# Patient Record
Sex: Female | Born: 1980 | Race: White | Hispanic: No | Smoking: Never smoker
Health system: Southern US, Community
[De-identification: ages and names within clinical notes are randomized; demographics above are authoritative.]

## PROBLEM LIST (undated history)

## (undated) DIAGNOSIS — F419 Anxiety disorder, unspecified: Secondary | ICD-10-CM

## (undated) DIAGNOSIS — M51369 Other intervertebral disc degeneration, lumbar region without mention of lumbar back pain or lower extremity pain: Secondary | ICD-10-CM

## (undated) DIAGNOSIS — Z808 Family history of malignant neoplasm of other organs or systems: Secondary | ICD-10-CM

## (undated) DIAGNOSIS — T451X5A Adverse effect of antineoplastic and immunosuppressive drugs, initial encounter: Secondary | ICD-10-CM

## (undated) DIAGNOSIS — D649 Anemia, unspecified: Secondary | ICD-10-CM

## (undated) DIAGNOSIS — M5136 Other intervertebral disc degeneration, lumbar region: Secondary | ICD-10-CM

## (undated) DIAGNOSIS — R011 Cardiac murmur, unspecified: Secondary | ICD-10-CM

## (undated) DIAGNOSIS — G620 Drug-induced polyneuropathy: Secondary | ICD-10-CM

## (undated) DIAGNOSIS — I10 Essential (primary) hypertension: Secondary | ICD-10-CM

## (undated) DIAGNOSIS — Z800 Family history of malignant neoplasm of digestive organs: Secondary | ICD-10-CM

## (undated) DIAGNOSIS — Z807 Family history of other malignant neoplasms of lymphoid, hematopoietic and related tissues: Secondary | ICD-10-CM

## (undated) DIAGNOSIS — F32A Depression, unspecified: Secondary | ICD-10-CM

## (undated) HISTORY — DX: Family history of malignant neoplasm of digestive organs: Z80.0

## (undated) HISTORY — DX: Family history of malignant neoplasm of other organs or systems: Z80.8

## (undated) HISTORY — PX: TUBAL LIGATION: SHX77

## (undated) HISTORY — DX: Family history of other malignant neoplasms of lymphoid, hematopoietic and related tissues: Z80.7

## (undated) HISTORY — PX: TONSILLECTOMY: SUR1361

---

## 2020-01-25 DIAGNOSIS — Z9221 Personal history of antineoplastic chemotherapy: Secondary | ICD-10-CM

## 2020-01-25 DIAGNOSIS — Z923 Personal history of irradiation: Secondary | ICD-10-CM

## 2020-01-25 HISTORY — DX: Personal history of irradiation: Z92.3

## 2020-01-25 HISTORY — DX: Personal history of antineoplastic chemotherapy: Z92.21

## 2020-03-24 DIAGNOSIS — C50919 Malignant neoplasm of unspecified site of unspecified female breast: Secondary | ICD-10-CM

## 2020-03-24 HISTORY — DX: Malignant neoplasm of unspecified site of unspecified female breast: C50.919

## 2020-04-14 ENCOUNTER — Other Ambulatory Visit: Payer: Self-pay | Admitting: Surgery

## 2020-04-14 DIAGNOSIS — C50911 Malignant neoplasm of unspecified site of right female breast: Secondary | ICD-10-CM

## 2020-04-15 ENCOUNTER — Encounter: Payer: Self-pay | Admitting: Radiation Oncology

## 2020-04-15 ENCOUNTER — Other Ambulatory Visit (HOSPITAL_COMMUNITY): Payer: Self-pay | Admitting: Surgery

## 2020-04-15 DIAGNOSIS — C50911 Malignant neoplasm of unspecified site of right female breast: Secondary | ICD-10-CM

## 2020-04-16 ENCOUNTER — Other Ambulatory Visit: Payer: Self-pay | Admitting: *Deleted

## 2020-04-16 DIAGNOSIS — C50411 Malignant neoplasm of upper-outer quadrant of right female breast: Secondary | ICD-10-CM | POA: Insufficient documentation

## 2020-04-19 ENCOUNTER — Ambulatory Visit (HOSPITAL_COMMUNITY)
Admission: RE | Admit: 2020-04-19 | Discharge: 2020-04-19 | Disposition: A | Discharge status: Home / Self Care | Payer: PRIVATE HEALTH INSURANCE | Source: Ambulatory Visit | Attending: Surgery | Admitting: Surgery

## 2020-04-19 ENCOUNTER — Encounter (HOSPITAL_COMMUNITY): Payer: Self-pay

## 2020-04-19 ENCOUNTER — Other Ambulatory Visit: Payer: Self-pay

## 2020-04-19 DIAGNOSIS — C50911 Malignant neoplasm of unspecified site of right female breast: Secondary | ICD-10-CM

## 2020-04-20 ENCOUNTER — Other Ambulatory Visit: Payer: Self-pay | Admitting: Radiation Oncology

## 2020-04-20 ENCOUNTER — Ambulatory Visit (HOSPITAL_COMMUNITY): Admission: RE | Admit: 2020-04-20 | Payer: PRIVATE HEALTH INSURANCE | Source: Ambulatory Visit

## 2020-04-20 ENCOUNTER — Encounter (HOSPITAL_COMMUNITY): Payer: Self-pay

## 2020-04-20 ENCOUNTER — Ambulatory Visit
Admission: RE | Admit: 2020-04-20 | Discharge: 2020-04-20 | Disposition: A | Discharge status: Home / Self Care | Payer: Self-pay | Source: Ambulatory Visit | Attending: Radiation Oncology | Admitting: Radiation Oncology

## 2020-04-20 ENCOUNTER — Inpatient Hospital Stay
Admission: RE | Admit: 2020-04-20 | Discharge: 2020-04-20 | Disposition: A | Discharge status: Home / Self Care | Payer: Self-pay | Source: Ambulatory Visit | Attending: Radiation Oncology | Admitting: Radiation Oncology

## 2020-04-20 ENCOUNTER — Ambulatory Visit (HOSPITAL_COMMUNITY)
Admission: RE | Admit: 2020-04-20 | Discharge: 2020-04-20 | Disposition: A | Discharge status: Home / Self Care | Payer: PRIVATE HEALTH INSURANCE | Source: Ambulatory Visit | Attending: Surgery | Admitting: Surgery

## 2020-04-20 DIAGNOSIS — C50911 Malignant neoplasm of unspecified site of right female breast: Secondary | ICD-10-CM | POA: Insufficient documentation

## 2020-04-20 DIAGNOSIS — C50919 Malignant neoplasm of unspecified site of unspecified female breast: Secondary | ICD-10-CM

## 2020-04-20 MED ORDER — GADOBUTROL 1 MMOL/ML IV SOLN
6.5000 mL | Freq: Once | INTRAVENOUS | Status: AC | PRN
  Administered 2020-04-20: 6.5 mL via INTRAVENOUS

## 2020-04-20 NOTE — Progress Notes (Signed)
I have placed an introductory phone call to this patient today. I introduced myself and explained my role in the patient's care. I provided my contact information and encouraged the patient to call with questions or concerns.

## 2020-04-21 ENCOUNTER — Other Ambulatory Visit: Payer: Self-pay | Admitting: Surgery

## 2020-04-21 ENCOUNTER — Other Ambulatory Visit: Payer: Self-pay

## 2020-04-21 ENCOUNTER — Inpatient Hospital Stay (HOSPITAL_COMMUNITY): Payer: PRIVATE HEALTH INSURANCE | Attending: Hematology | Admitting: Hematology

## 2020-04-21 ENCOUNTER — Encounter (HOSPITAL_COMMUNITY): Payer: Self-pay | Admitting: Hematology

## 2020-04-21 VITALS — BP 126/79 | HR 80 | Temp 98.9°F | Resp 18 | Ht 60.0 in | Wt 131.0 lb

## 2020-04-21 DIAGNOSIS — C50911 Malignant neoplasm of unspecified site of right female breast: Secondary | ICD-10-CM

## 2020-04-21 DIAGNOSIS — C50411 Malignant neoplasm of upper-outer quadrant of right female breast: Secondary | ICD-10-CM | POA: Diagnosis present

## 2020-04-21 DIAGNOSIS — Z171 Estrogen receptor negative status [ER-]: Secondary | ICD-10-CM

## 2020-04-21 DIAGNOSIS — Z170 Estrogen receptor positive status [ER+]: Secondary | ICD-10-CM | POA: Diagnosis not present

## 2020-04-21 DIAGNOSIS — Z85110 Personal history of malignant carcinoid tumor of bronchus and lung: Secondary | ICD-10-CM | POA: Insufficient documentation

## 2020-04-21 NOTE — Progress Notes (Addendum)
[facility] [address], [place] [address]   Patient Care Team: Default, Provider, MD as PCP - General Brien Mates, RN as Oncology Nurse Navigator (Oncology)  CHIEF COMPLAINTS/PURPOSE OF CONSULTATION:  Newly diagnosed right breast cancer  HISTORY OF PRESENTING ILLNESS:  Lauren Ortega 40 y.o. female is here because of recent diagnosis of right breast cancer, at the request of Dr. Coralie Keens from [facility]. She is scheduled to have a right breast lumpectomy on 04/27 by Dr. Ninfa Linden.  Today she is accompanied by her friend, Lauren Ortega, and she reports feeling okay. She reports that she felt the mass in February and had the mammogram and biopsy in [place]. She has never had any breast biopsies previously. She reports having heavy periods lasting 5 days every 28 days. She has degenerative disc disease and will have occasional tingling in her left leg down to her toes, but none in her hands. She gets leg swelling occasionally since her amlodipine was increased to 10 mg.  Her MGM deceased from colon cancer; her MGF deceased from non-Hodgkin lymphoma; PGF deceased from lung cancer; her mother had basal cell skin carcinoma; maternal uncle has multiple myeloma and treated for the past 10 years. She works as a Production designer, theatre/television/film for a [employer]. She is a non-smoker.  She is scheduled to meet Dr. Lisbeth Renshaw on 03/30.  I reviewed her records extensively and collaborated the history with the patient.  SUMMARY OF ONCOLOGIC HISTORY: Oncology History   No history exists.    In terms of breast cancer risk profile:  She menarched at early age of 32. LMP was on 03/25.  She had 4 pregnancies, her first child was born at age 40. First pregnancy was terminated at 12 weeks at age 54. She received birth control pills for approximately 6 years.  She was never exposed to fertility medications or hormone replacement therapy.  She has negative family history of  Breast/GYN/GI cancer.  MEDICAL HISTORY:  No past medical history on file.  SURGICAL HISTORY: ** The histories are not reviewed yet. Please review them in the "History" navigator section and refresh this [place].  SOCIAL HISTORY: Social History   Socioeconomic History  . Marital status: Divorced    Spouse name: Not on file  . Number of children: Not on file  . Years of education: Not on file  . Highest education level: Not on file  Occupational History  . Not on file  Tobacco Use  . Smoking status: Never Smoker  . Smokeless tobacco: Never Used  Substance and Sexual Activity  . Alcohol use: Yes    Comment: occassionally  . Drug use: Not on file  . Sexual activity: Yes  Other Topics Concern  . Not on file  Social History Narrative  . Not on file   Social Determinants of Health   Financial Resource Strain: Low Risk   . Difficulty of Paying Living Expenses: Not hard at all  Food Insecurity: No Food Insecurity  . Worried About Charity fundraiser in the Last Year: Never true  . Ran Out of Food in the Last Year: Never true  Transportation Needs: No Transportation Needs  . Lack of Transportation (Medical): No  . Lack of Transportation (Non-Medical): No  Physical Activity: Insufficiently Active  . Days of Exercise per Week: 2 days  . Minutes of Exercise per Session: 20 min  Stress: No Stress Concern Present  . Feeling of Stress : Not at all  Social Connections: Moderately Integrated  . Frequency of Communication with Friends and Family: More than three times a week  . Frequency of Social Gatherings with Friends and Family: More than three times a week  . Attends Religious Services: 1 to 4 times per year  . Active Member of Clubs or Organizations: No  . Attends Archivist Meetings: 1 to 4 times per year  . Marital Status: Divorced  Human resources officer Violence: Not At Risk  . Fear of Current or Ex-Partner: No  . Emotionally Abused: No  . Physically Abused: No   . Sexually Abused: No    FAMILY HISTORY: No family history on file.  ALLERGIES:  has no allergies on file.  MEDICATIONS:  Current Outpatient Medications  Medication Sig Dispense Refill  . amLODipine (NORVASC) 10 MG tablet Take 10 mg by mouth daily.    Marland Kitchen FERREX 150 150 MG capsule Take 150 mg by mouth daily.    . sertraline (ZOLOFT) 50 MG tablet Take 50 mg by mouth daily.    Marland Kitchen spironolactone (ALDACTONE) 50 MG tablet Take 50 mg by mouth every morning.    . baclofen (LIORESAL) 20 MG tablet Take 20 mg by mouth 3 (three) times daily as needed. (Patient not taking: Reported on 04/21/2020)    . meloxicam (MOBIC) 15 MG tablet Take 15 mg by mouth daily as needed. (Patient not taking: Reported on 04/21/2020)     No current facility-administered medications for this visit.    REVIEW OF SYSTEMS:   Review of Systems  Constitutional: Positive for appetite change (75%) and fatigue (50%).  Cardiovascular: Positive for leg swelling (occasional d/t amlodipine).  Genitourinary: Negative for menstrual problem.   Neurological: Positive for numbness (occasional in L toes).  All other systems reviewed and are negative.   PHYSICAL EXAMINATION: ECOG PERFORMANCE STATUS: 0 - Asymptomatic  Vitals:   04/21/20 1414  BP: 126/79  Pulse: 80  Resp: 18  Temp: 98.9 F (37.2 C)  SpO2: 99%   Filed Weights   04/21/20 1414  Weight: 131 lb (59.4 kg)   Physical Exam Vitals reviewed.  Constitutional:      Appearance: Normal appearance.  Cardiovascular:     Rate and Rhythm: Normal rate and regular rhythm.     Pulses: Normal pulses.     Heart sounds: Normal heart sounds.  Pulmonary:     Effort: Pulmonary effort is normal.     Breath sounds: Normal breath sounds.  Chest:  Breasts:     Right: Mass (2 cm freely mobile @ 10 o'clock) present. No swelling, bleeding, inverted nipple, nipple discharge, skin change, tenderness, axillary adenopathy or supraclavicular adenopathy.     Left: No swelling, bleeding,  inverted nipple, mass, nipple discharge, skin change, tenderness, axillary adenopathy or supraclavicular adenopathy.    Abdominal:     Palpations: Abdomen is soft. There is no hepatomegaly, splenomegaly or mass.     Tenderness: There is no abdominal tenderness.  Musculoskeletal:     Right lower leg: No edema.     Left lower leg: No edema.  Lymphadenopathy:     Cervical: No cervical adenopathy.     Upper Body:     Right upper body: No supraclavicular, axillary or pectoral adenopathy.     Left upper body: No supraclavicular, axillary or pectoral adenopathy.  Neurological:     General: No focal deficit present.     Mental Status: She is alert and oriented to person, place, and time.  Psychiatric:  Mood and Affect: Mood normal.        Behavior: Behavior normal.      LABORATORY DATA:  I have reviewed the data as listed No results found for this or any previous visit (from the past 2160 hour(s)).  RADIOGRAPHIC STUDIES: I have personally reviewed the radiological reports and agreed with the findings in the report. No results found.   ASSESSMENT:  1.  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  She had mammogram/ultrasound on 03/18/2020 done in [place] which showed a 1.4 x 0.9 x 1.5 cm mass in the upper outer quadrant of the right breast. -Biopsy on 03/31/2020 consistent with intermediate to high-grade infiltrating ductal carcinoma of the right breast at 10:30 position, HER-2 3+ positive, Ki-67 40%, ER weak staining in less than 10% of tumor cells, PR negative. -She was evaluated by Dr. Ninfa Linden and MRI was done on 04/20/2020.  2.  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  Never smoker. -Maternal grandmother had colon cancer.  Maternal grandfather died of non-Hodgkin's lymphoma.  Paternal grandfather died of lung cancer.   Maternal uncle had multiple myeloma.  Mother had basal cell skin cancer.    PLAN:  1.  Stage I (T1c N0 M0) right breast IDC, ER weakly positive, PR negative, HER-2 3+ positive: -We talked about pathology report and scans in detail. -I have also talked to her about neoadjuvant chemotherapy with [facility] regimen pending the MRI results. -If the MRI does not show any lymph node involvement, she may proceed with surgical resection followed by 6 cycles of TCH in 1 year of Herceptin in total.  We also talked about side effects of TCH regimen in general.  She is also meeting up with Dr. Lisbeth Renshaw tomorrow. -She will require a port placed.  We have also talked to her about URCC nausea protocol. -Given her young age of diagnosis, strongly recommend genetic testing.  Addendum: -I have reviewed MRI reports with the patient from 04/20/2020 which showed 1.5 cm upper outer right breast malignancy which was biopsy-proven.  No evidence of multifocal or multicentric or contralateral malignancy.  No abnormal lymph nodes. -I have recommended proceed with lumpectomy and SLNB. -We will see her back in 3 to 4 weeks after surgery.     All questions were answered. The patient knows to call the clinic with any problems, questions or concerns.  Derek Jack, MD 04/21/20 3:13 PM  [facility] [phone]   I, Milinda Antis, am acting as a scribe for Dr. Sanda Linger.  I, Derek Jack MD, have reviewed the above documentation for accuracy and completeness, and I agree with the above.

## 2020-04-21 NOTE — Progress Notes (Signed)
New Breast Cancer Diagnosis: Right Breast Cancer UOQ  Did patient present with symptoms (if so, please note symptoms) or screening mammography?:Palpable mass    Location and Extent of disease :right breast. Located at upper outer quadrant, measured  1.5 cm in greatest dimension. Adenopathy no.  Histology per Pathology Report: grade 3, Infiltrating Ductal Carcinoma 03/31/2020  Receptor Status: ER(weakly positive), PR (negative), Her2-neu (positive), Ki-(40%)  Surgeon and surgical plan, if any: Dr. Beryle Beams 04/14/2020 -She would like to pursue breast conservation. -Given her Her2 positive status, she will also require a Port-a-cath insertion. -She needs a preoperative MRI given her age to determine the true size of the malignancy. -She will also need both medical and radiation oncology as well as genetics. -If she is genetically positive, we will consider bilateral mastectomies. -Medical oncology may determine that we need to proceed with neoadjuvant therapy prior to surgery. -Tentatively scheduled Right breast lumpectomy with radioactive seed and SLN biopsy 05/20/2020  Medical oncologist, treatment if any:   Dr. Delton Coombes 04/21/2020 2 pm -We talked about pathology report and scans in detail. -I have offered her neoadjuvant chemotherapy with [facility] regimen pending the MRI results. -If the MRI does not show any lymph node involvement, she may proceed with surgical resection followed by 6 cycles of TCH in 1 year of Herceptin in total.  We also talked about side effects of TCH regimen in general.  She is also meeting up with Dr. Lisbeth Renshaw tomorrow. -She will require a port placed.  We have also talked to her about URC nausea protocol. -Given her young age of diagnosis, strongly recommend genetic testing. -We will reach out to the patient after the MRI resulted.   Family History of Breast/Ovarian/Prostate Cancer: None  Lymphedema issues, if any:  no  Pain issues, if any:  Has some soreness from he  biopsy.  SAFETY ISSUES: Prior radiation? No Pacemaker/ICD? No Possible current pregnancy? Tubal Ligation Is the patient on methotrexate? No  Current Complaints / other details:   -Genetics 05/04/2020

## 2020-04-21 NOTE — Patient Instructions (Signed)
[place] at [facility] Discharge Instructions  You were seen and examined today by Dr. Delton Coombes. Dr. Delton Coombes is a medical oncologist, meaning he specializes in the management of cancer diagnoses with medications. Dr. Delton Coombes discussed your past medical history, family history of cancer and the events that led to you being here today.  You have been diagnosed with HER2 positive breast cancer. This does mean that your type of cancer is a bit more aggressive. Dr. Delton Coombes has recommended that you proceed as scheduled with the lumpectomy surgery, but typically we arrange for treatment first followed by surgery. Pending MRI results, if there are questionable lymph nodes involved with cancer, you will need an additional biopsy of the lymph nodes. Assuming there is no lymph nodes involved, you have a Stage I cancer. Dr. Delton Coombes has discussed chemotherapy regimen. A more accurate plan will be devised once the MRI is read.  Dr. Delton Coombes has recommended an echocardiogram in anticipation of chemotherapy. Dr. Delton Coombes has also recommended genetic testing, this could help to drive treatment options and be helpful for your children if positive.  Follow-up as scheduled.   Thank you for choosing [place] at [facility] to provide your oncology and hematology care.  To afford each patient quality time with our provider, please arrive at least 15 minutes before your scheduled appointment time.   If you have a lab appointment with the [facility] please come in thru the Main Entrance and check in at the main information desk.  You need to re-schedule your appointment should you arrive 10 or more minutes late.  We strive to give you quality time with our providers, and arriving late affects you and other patients whose appointments are after yours.  Also, if you no show three or more times for appointments you may be dismissed from the clinic at the  providers discretion.     Again, thank you for choosing [facility].  Our hope is that these requests will decrease the amount of time that you wait before being seen by our physicians.       _____________________________________________________________  Should you have questions after your visit to [facility], please contact our office at [phone] and follow the prompts.  Our office hours are 8:00 a.m. and 4:30 p.m. Monday - Friday.  Please note that voicemails left after 4:00 p.m. may not be returned until the following business day.  We are closed weekends and major holidays.  You do have access to a nurse 24-7, just call the main number to the clinic [phone] and do not press any options, hold on the line and a nurse will answer the phone.    For prescription refill requests, have your pharmacy contact our office and allow 72 hours.    Due to Covid, you will need to wear a mask upon entering the hospital. If you do not have a mask, a mask will be given to you at the Main Entrance upon arrival. For doctor visits, patients may have 1 support person age 41 or older with them. For treatment visits, patients can not have anyone with them due to social distancing guidelines and our immunocompromised population.

## 2020-04-22 ENCOUNTER — Ambulatory Visit
Admission: RE | Admit: 2020-04-22 | Discharge: 2020-04-22 | Disposition: A | Discharge status: Home / Self Care | Payer: PRIVATE HEALTH INSURANCE | Source: Ambulatory Visit | Attending: Radiation Oncology | Admitting: Radiation Oncology

## 2020-04-22 ENCOUNTER — Other Ambulatory Visit: Payer: Self-pay

## 2020-04-22 ENCOUNTER — Telehealth: Payer: Self-pay | Admitting: Radiology

## 2020-04-22 ENCOUNTER — Encounter: Payer: Self-pay | Admitting: Radiation Oncology

## 2020-04-22 VITALS — Ht 60.0 in | Wt 131.0 lb

## 2020-04-22 DIAGNOSIS — C50411 Malignant neoplasm of upper-outer quadrant of right female breast: Secondary | ICD-10-CM

## 2020-04-22 DIAGNOSIS — Z170 Estrogen receptor positive status [ER+]: Secondary | ICD-10-CM

## 2020-04-22 NOTE — Progress Notes (Signed)
Radiation Oncology         [phone] ________________________________  Initial Outpatient Consultation - Conducted via telephone due to current COVID-19 concerns for limiting patient exposure  I spoke with the patient to conduct this consult visit via telephone to spare the patient unnecessary potential exposure in the healthcare setting during the current COVID-19 pandemic. The patient was notified in advance and was offered a [place] meeting to allow for face to face communication but unfortunately reported that they did not have the appropriate resources/technology to support such a visit and instead preferred to proceed with a telephone consult.   Name: Lauren Ortega        MRN: [id number]  Date of Service: 04/22/2020 DOB: April 17, 1980  OZ:[id number], Provider, MD  Coralie Keens, MD     REFERRING PHYSICIAN: Coralie Keens, MD   DIAGNOSIS: The encounter diagnosis was Malignant neoplasm of upper-outer quadrant of right female breast, unspecified estrogen receptor status ([place]).   HISTORY OF PRESENT ILLNESS: Lauren Ortega is a 40 y.o. female seen at the request of Dr. Ninfa Linden for new diagnosis of right breast cancer.  The patient was found to have a mass in the upper outer quadrant of the right breast that she noted.  Diagnostic work-up showed this mass to measure 1.5 cm in the upper outer quadrant of the right breast and a biopsy on 03/31/2020 showed an infiltrating ductal carcinoma, grade 3 her ER was positive with weak staining in less than 10% of tumor cells PR negative, and HER-2 amplified, her Ki-67 was 40%.  She has met with Dr. Ninfa Linden who recommended lumpectomy with sentinel lymph node biopsy and Port-A-Cath placement.  She did undergo an MRI of the breast on 04/20/2020 but the results have not been read.  She also met with Dr. Delton Coombes yesterday and plans to receive genetic testing results and systemic therapy with the timing of this depending on her MRI results.  She is contacted today by  telephone to discuss adjuvant radiotherapy.     PREVIOUS RADIATION THERAPY: No   PAST MEDICAL HISTORY:  Past Medical History:  Diagnosis Date  . Breast cancer ([place]) 03/2020       PAST SURGICAL HISTORY: Past Surgical History:  Procedure Laterality Date  . TUBAL LIGATION       FAMILY HISTORY:  Family History  Problem Relation Age of Onset  . Basal cell carcinoma Mother   . Multiple myeloma Maternal Uncle   . Colon cancer Maternal Grandmother   . Non-Hodgkin's lymphoma Maternal Grandfather   . Lung cancer Paternal Grandfather      SOCIAL HISTORY:  reports that she has never smoked. She has never used smokeless tobacco. She reports current alcohol use. She reports that she does not use drugs. The patient is divorced and lives in [place], [place]. 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 She has for children ages 84, 5, and 3 year old twins.    ALLERGIES: Patient has no allergy information on record.   MEDICATIONS:  Current Outpatient Medications  Medication Sig Dispense Refill  . amLODipine (NORVASC) 10 MG tablet Take 10 mg by mouth daily.    Marland Kitchen FERREX 150 150 MG capsule Take 150 mg by mouth daily.    . sertraline (ZOLOFT) 50 MG tablet Take 50 mg by mouth daily.    Marland Kitchen spironolactone (ALDACTONE) 50 MG tablet Take 50 mg by mouth every morning.    . baclofen (LIORESAL) 20 MG tablet Take 20 mg by mouth 3 (  three) times daily as needed. (Patient not taking: No sig reported)    . meloxicam (MOBIC) 15 MG tablet Take 15 mg by mouth daily as needed. (Patient not taking: No sig reported)     No current facility-administered medications for this encounter.     REVIEW OF SYSTEMS: On review of systems, the patient reports that she is doing well overall. She has noticed some soreness of the breast since her biopsy. She is hoping to keep her breast. No other specific concerns were otherwise noted.     PHYSICAL EXAM:  Wt Readings from Last 3 Encounters:   04/22/20 131 lb (59.4 kg)  04/21/20 131 lb (59.4 kg)   Unable to assess due to encounter type.    ECOG = 0  0 - Asymptomatic (Fully active, able to carry on all predisease activities without restriction)  1 - Symptomatic but completely ambulatory (Restricted in physically strenuous activity but ambulatory and able to carry out work of a light or sedentary nature. For example, light housework, office work)  2 - Symptomatic, <50% in bed during the day (Ambulatory and capable of all self care but unable to carry out any work activities. Up and about more than 50% of waking hours)  3 - Symptomatic, >50% in bed, but not bedbound (Capable of only limited self-care, confined to bed or chair 50% or more of waking hours)  4 - Bedbound (Completely disabled. Cannot carry on any self-care. Totally confined to bed or chair)  5 - Death   Eustace Pen MM, Creech RH, Tormey DC, et al. [phone]). "Toxicity and response criteria of the [facility]". [place] Oncol. 5 (6): 649-55    LABORATORY DATA:  No results found for: WBC, HGB, HCT, MCV, PLT No results found for: NA, K, CL, CO2 No results found for: ALT, AST, GGT, ALKPHOS, BILITOT    RADIOGRAPHY: No results found.     IMPRESSION/PLAN: 1. Stage IA, cT1cN0M0 grade 3 ER positive, HER2 amplified invasive ductal carcinoma of the right breast. Dr. Lisbeth Renshaw discusses the pathology findings and reviews the nature of right breast disease. The consensus from the breast conference includes MRI for extent of disease. She is hoping for breast conservation with lumpectomy with sentinel node biopsy. Dr. Delton Coombes is planning adjuvant chemotherapy. Following systemic therapy, if she does undergo breast conserving surgery, Dr. Lisbeth Renshaw would recommend external radiotherapy to the breast  to reduce risks of local recurrence followed by antiestrogen therapy. We discussed the risks, benefits, short, and long term effects of radiotherapy, as well as the  curative intent, and the patient is interested in proceeding but may want to be treated closer to home. Dr. Lisbeth Renshaw discusses the delivery and logistics of radiotherapy and anticipates a course of 6 1/2 weeks of radiotherapy. We will see her back a few weeks after completing systemic therapy if she wanted treatment in [place], to discuss the simulation process and anticipate we starting radiotherapy about 4-6 weeks after chemotherapy.  2. Possible genetic predisposition to malignancy. The patient is a candidate for genetic testing given her personal and family history. She was offered referral and will meet with genetics for testing. 3. Contraceptive Counseling. The patient has had a tubal ligation and would not need pregnancy testing prior to simulation and treatment.  Given current concerns for patient exposure during the COVID-19 pandemic, this encounter was conducted via telephone.  The patient has provided two factor identification and has given verbal consent for this type of encounter and has been  advised to only accept a meeting of this type in a secure network environment. The time spent during this encounter was 60 minutes including preparation, discussion, and coordination of the patient's care. The attendants for this meeting include Blenda Nicely, RN, Dr. Lisbeth Renshaw, Hayden Pedro  and Isabella Stalling.  During the encounter,  Blenda Nicely, RN, Dr. Lisbeth Renshaw, and Hayden Pedro were located at [facility] Radiation Oncology Department.  Lauren Ortega was located at home.   The above documentation reflects my direct findings during this shared patient visit. Please see the separate note by Dr. Lisbeth Renshaw on this date for the remainder of the patient's plan of care.    Lauren Ortega, [facility]    **Disclaimer: This note was dictated with voice recognition software. Similar sounding words can inadvertently be transcribed and this note may contain transcription errors which may not  have been corrected upon publication of note.**

## 2020-04-22 NOTE — Telephone Encounter (Signed)
[place] [address]: TREATMENT OF REFRACTORY NAUSEA  04/22/20    2:45PM  PHONE CALL: Confirmed I was speaking with Lauren Ortega. Introduced myself as the Scientist, physiological at [employer]. Confirmed with patient that Dr. Delton Coombes spoke with her about the above mentioned study and patient stated she would be interested. I gave further background information on the study, including purpose, 2 part format, questionnaires, and randomization aspect. Patient was given a copy of the consent and HIPPA authorization forms during her visit yesterday (Tuesday 04/21/20). Patient did not have any questions at this time. I thanked patient for her time and plan on speaking with her at a later date, after she completes and recovers from surgery.   [name], RT(R)(T) Clinical Research Coordinator

## 2020-04-27 ENCOUNTER — Telehealth: Payer: PRIVATE HEALTH INSURANCE | Admitting: Licensed Clinical Social Worker

## 2020-04-28 ENCOUNTER — Inpatient Hospital Stay (HOSPITAL_COMMUNITY): Payer: PRIVATE HEALTH INSURANCE | Attending: Hematology | Admitting: General Practice

## 2020-04-28 ENCOUNTER — Other Ambulatory Visit: Payer: Self-pay

## 2020-04-28 DIAGNOSIS — C50411 Malignant neoplasm of upper-outer quadrant of right female breast: Secondary | ICD-10-CM

## 2020-04-28 NOTE — Progress Notes (Signed)
[place] [facility]  Initial Assessment   Lauren Ortega is a 40 y.o. year old female presenting alone. Clinical Social Work was referred by medical oncology for assessment of psychosocial needs.   SDOH (Social Determinants of Health) assessments performed: Yes   Distress Screen completed: Yes ONCBCN DISTRESS SCREENING 04/22/2020  Screening Type Initial Screening  Distress experienced in past week (1-10) 6  Emotional problem type Adjusting to illness  Physician notified of physical symptoms -  Referral to clinical psychology -  Referral to clinical social work -  Referral to dietition -  Referral to financial advocate -  Referral to support programs -  Referral to palliative care -  Other Contact via phone      Family/Social Information:  . Housing Arrangement: patient lives with 4 children in [place], she closed on new house in Jan 2022. . Family members/support persons in your life? Divorced, single mother to 4 children (73., 29, 59, 73).  She has support from her mother who lives nearby, numerous friends and colleagues, as well as support for children's needs from ex husband . Transportation concerns: When possible, she will drive herself.  If not, her mother has agreed to help w transport . Employment: Working full time. .  Income source: works for [employer] in [employer], materials . Financial concerns: Yes, due to illness and/or loss of work during treatment o Type of concern: Rent/ mortgage and she is not eligible for FMLA due to length of employment, short term disability starts after she has been out of work for 30 days.  She hopes to be able to work throughout treatment, taking only the minimum amount of time off so she can continue to have income.  She is most concerned about mortgage. . Food access concerns: None at this time . Religious or spiritual practice: Did not ask . Medication Concerns:none . Services Currently in place:  none  Coping/ Adjustment to  diagnosis: . Patient understands treatment plan and what happens next? Newly diagnosed with Stage 1 infiltrating ductal carcinoma.  This was discovered after she felt a breast lump and underwent workup, resulting in cancer diagnosis.  She reports that getting the initial diagnosis was difficult but she is also determined to take steps to survive cancer.  She has an optimistic outlook, and is proactive in finding information and preparing for what is ahead.  She has thought through and consulted others  With similar treatment plans - she hopes to schedule treatments/surgery to have the minimum amount of loss of work hours.  She has a very supportive employer who is trying to help in any way possible.  She has had open communication with her children - all are aware of her diagnosis and coping in their own ways.   . Concerns about diagnosis and/or treatment: Body image (disfigurement) - most concerned about visible signs of being in treatment for cancer (hair loss) - she is thinking through positive ways to address this concern . Patient reported stressors: Finances, Children and Adjusting to my illness . Hopes and priorities: wants to maximize time at work while still caring for herself during treatment. Discussed need to focus on wellness activities and allow others to help so her load as a single mother is lessened as much as possible.   . Patient enjoys exercise, time with family/ friends and she is doing yoga daily, prioritizing healthy eating, walking . Current coping skills/ strengths: Average or above average intelligence, Capable of independent living, Communication skills, General fund of  knowledge, Motivation for treatment/growth and Supportive family/friends    SUMMARY: Current SDOH Barriers:  . none  Clinical Social Work Clinical Goal(s):  . patient will work with SW to address concerns related to childrens adjustment if she sees any issues arise. Discussed normal reactions/responses of  children and various ways to best support chlidren of parents diagnosed with cancer  Interventions: . Discussed common feeling and emotions when being diagnosed with cancer, and the importance of support during treatment . Informed patient of the support team roles and support services at [facility] . Provided CSW contact information and encouraged patient to call with any questions or concerns . Provided patient with information about [employer], [employer], [employer], [employer] mentors, Personal assistant, Triage Cancer   Follow Up Plan: Patient will contact CSW with any support or resource needs Patient verbalizes understanding of plan: Yes    [place] [facility], [place], [place] Social Worker Phone:  [phone]

## 2020-05-01 ENCOUNTER — Ambulatory Visit: Payer: PRIVATE HEALTH INSURANCE | Attending: Surgery | Admitting: Rehabilitation

## 2020-05-01 ENCOUNTER — Encounter: Payer: Self-pay | Admitting: Rehabilitation

## 2020-05-01 ENCOUNTER — Other Ambulatory Visit: Payer: Self-pay

## 2020-05-01 DIAGNOSIS — C50411 Malignant neoplasm of upper-outer quadrant of right female breast: Secondary | ICD-10-CM | POA: Diagnosis present

## 2020-05-01 DIAGNOSIS — Z171 Estrogen receptor negative status [ER-]: Secondary | ICD-10-CM | POA: Diagnosis present

## 2020-05-01 DIAGNOSIS — R293 Abnormal posture: Secondary | ICD-10-CM | POA: Diagnosis present

## 2020-05-01 NOTE — Therapy (Signed)
[place], [place], [address] Phone: [phone]   Fax:  [phone]  Physical Therapy Evaluation  Patient Details  Name: Lauren Ortega MRN: [id number] Date of Birth: 07-Jul-1980 Referring Provider (PT): Dr. Ninfa Linden   Encounter Date: 05/01/2020   PT End of Session - 05/01/20 0834    Visit Number 1    Number of Visits 2    Date for PT Re-Evaluation 06/17/20    PT Start Time 0800    PT Stop Time 0831    PT Time Calculation (min) 31 min    Activity Tolerance Patient tolerated treatment well    Behavior During Therapy [facility] for tasks assessed/performed           Past Medical History:  Diagnosis Date  . Breast cancer ([place]) 03/2020    Past Surgical History:  Procedure Laterality Date  . TUBAL LIGATION      There were no vitals filed for this visit.    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 Ninfa Linden, HTN, DDD lumbar spine,    Patient Stated Goals get information from providers    Currently in Pain? No/denies              [facility] PT Assessment - 05/01/20 0001      Assessment   Medical Diagnosis Rt breast cancer    Referring Provider (PT) Dr. Ninfa Linden    Onset Date/Surgical Date 05/01/20    Hand Dominance Right      Precautions   Precaution Comments active cancer      Restrictions   Weight Bearing Restrictions No      Balance Screen   Has the patient fallen in the past 6 months No    Has the patient had a decrease in activity level because of a fear of falling?  No    Is the patient reluctant to leave their home because of a fear of falling?  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    Leisure yoga, was doing weight lifting      Posture/Postural Control   Posture/Postural Control Postural limitations    Postural Limitations Rounded Shoulders;Forward head      ROM / Strength   AROM / PROM / Strength AROM      AROM   AROM Assessment Site Shoulder    Right/Left Shoulder Right;Left    Right Shoulder Extension 59 Degrees    Right Shoulder Flexion 179 Degrees    Right Shoulder ABduction 170 Degrees    Right Shoulder External Rotation 105 Degrees              L-DEX FLOWSHEETS - 05/01/20 0800      L-DEX LYMPHEDEMA SCREENING   Measurement Type Unilateral    L-DEX MEASUREMENT EXTREMITY Upper Extremity    POSITION  Standing    DOMINANT SIDE Right    At Risk Side Right    BASELINE SCORE (UNILATERAL) 0.1                Quick  Dash - 05/01/20 0001    Open a tight or new jar Mild difficulty    Do heavy household chores (wash walls, wash floors) No difficulty    Carry a shopping bag or briefcase No difficulty    Wash your back No difficulty    Use a knife to cut food No difficulty    Recreational activities in which you take some force or impact through your arm, shoulder, or hand (golf, hammering, tennis) No difficulty    During the past week, to what extent has your arm, shoulder or hand problem interfered with your normal social activities with family, friends, neighbors, or groups? Modererately    During the past week, to what extent has your arm, shoulder or hand problem limited your work or other regular daily activities Not at all    Arm, shoulder, or hand pain. Mild    Tingling (pins and needles) in your arm, shoulder, or hand None    Difficulty Sleeping No difficulty    DASH Score 9.09 %            Objective measurements completed on examination: See above findings.               PT Education - 05/01/20 [phone]    Education  Details post op stretches, POC, lymphedema, SOZO, walking    Person(s) Educated Patient    Methods Explanation;Handout    Comprehension Verbalized understanding               PT Long Term Goals - 05/01/20 9924      PT LONG TERM GOAL #1   Title Pt will return to baseline AROM    Time 8    Period Weeks    Status New      PT LONG TERM GOAL #2   Title Pt will be scheduled for post op SOZO surveillance to detect subclinical lymphedema    Time [facility] Goals - 05/01/20 2683      Patient will be able to verbalize understanding of pertinent lymphedema risk reduction practices relevant to her diagnosis specifically related to skin care.   Status Achieved      Patient will be able to return demonstrate and/or verbalize understanding of the post-op home exercise program related to regaining shoulder range of motion.   Status Achieved      Patient will be able to verbalize understanding of the importance of attending the postoperative After Breast Cancer Class for further lymphedema risk reduction education and therapeutic exercise.   Status Achieved                 Plan - 05/01/20 0835    Clinical Impression Statement Pt presents pre Rt lumpectomy and SLNB scheduled on 05/20/20 with Dr. Ninfa Linden.  Pt will have 6 cycles of Katie post surgery followed by radiation is the current plan.  Baseline AROM and SOZO taken today with instruction on lymphedema basics, importance of walking, and post operative stretches.  Pt was scheduled for follow up today.           Patient will benefit from skilled therapeutic intervention in order to improve the following deficits and impairments:     Visit Diagnosis: Abnormal posture  Malignant neoplasm of upper-outer quadrant of right breast in female, estrogen receptor negative [facility])     Problem List Patient Active Problem List  Diagnosis Date Noted  . Malignant neoplasm of upper-outer  quadrant of right female breast ([place]) 04/16/2020    Stark Bray 05/01/2020, 8:40 AM  [place], [place], [address] Phone: [phone]   Fax:  [phone]  Name: Tonja Jezewski MRN: [id number] Date of Birth: [birth year]

## 2020-05-01 NOTE — Patient Instructions (Signed)

## 2020-05-04 ENCOUNTER — Inpatient Hospital Stay: Payer: PRIVATE HEALTH INSURANCE | Attending: Genetic Counselor | Admitting: Licensed Clinical Social Worker

## 2020-05-04 ENCOUNTER — Encounter: Payer: Self-pay | Admitting: Licensed Clinical Social Worker

## 2020-05-04 DIAGNOSIS — C50411 Malignant neoplasm of upper-outer quadrant of right female breast: Secondary | ICD-10-CM | POA: Diagnosis not present

## 2020-05-04 DIAGNOSIS — Z170 Estrogen receptor positive status [ER+]: Secondary | ICD-10-CM

## 2020-05-04 DIAGNOSIS — Z808 Family history of malignant neoplasm of other organs or systems: Secondary | ICD-10-CM | POA: Insufficient documentation

## 2020-05-04 DIAGNOSIS — Z807 Family history of other malignant neoplasms of lymphoid, hematopoietic and related tissues: Secondary | ICD-10-CM | POA: Diagnosis not present

## 2020-05-04 DIAGNOSIS — Z800 Family history of malignant neoplasm of digestive organs: Secondary | ICD-10-CM | POA: Insufficient documentation

## 2020-05-04 NOTE — Progress Notes (Signed)
REFERRING PROVIDER: Derek Jack, MD [address] [place],  [place] [address]  PRIMARY PROVIDER:  Default, Provider, MD  PRIMARY REASON FOR VISIT:  1. Malignant neoplasm of upper-outer quadrant of right breast in female, estrogen receptor positive ([place])   2. Family history of pancreatic cancer   3. Family history of skin cancer   4. Family history of lymphoma   5. Family history of colon cancer      HISTORY OF PRESENT ILLNESS:   [name], a 40 y.o. female, was seen for a  cancer genetics consultation at the request of Dr. Delton Coombes due to a personal and family history of cancer.  [name] presents to clinic today to discuss the possibility of a hereditary predisposition to cancer, genetic testing, and to further clarify her future cancer risks, as well as potential cancer risks for family members.    In 2022, at the age of 37, Ms. Ince was diagnosed with breast cancer, ER+, PR-, Her2+. The treatment plan currently includes lumpectomy, which is scheduled for 4/27. She does note that genetic testing would help guide her surgical decision.   CANCER HISTORY:  Oncology History   No history exists.     RISK FACTORS:  Menarche was at age 55.  First live birth at age 74.  Ovaries intact: yes.  Hysterectomy: no.  Menopausal status: premenopausal.  HRT use: 0 years.   Past Medical History:  Diagnosis Date  . Breast cancer ([place]) 03/2020  . Family history of colon cancer   . Family history of lymphoma   . Family history of pancreatic cancer   . Family history of skin cancer     Past Surgical History:  Procedure Laterality Date  . TUBAL LIGATION      Social History   Socioeconomic History  . Marital status: Divorced    Spouse name: Not on file  . Number of children: Not on file  . Years of education: Not on file  . Highest education level: Not on file  Occupational History  . Not on file  Tobacco Use  . Smoking status: Never Smoker  . Smokeless  tobacco: Never Used  Substance and Sexual Activity  . Alcohol use: Yes    Comment: occassionally  . Drug use: Never  . Sexual activity: Yes    Birth control/protection: Surgical  Other Topics Concern  . Not on file  Social History Narrative  . Not on file   Social Determinants of Health   Financial Resource Strain: Low Risk   . Difficulty of Paying Living Expenses: Not hard at all  Food Insecurity: No Food Insecurity  . Worried About Charity fundraiser in the Last Year: Never true  . Ran Out of Food in the Last Year: Never true  Transportation Needs: No Transportation Needs  . Lack of Transportation (Medical): No  . Lack of Transportation (Non-Medical): No  Physical Activity: Insufficiently Active  . Days of Exercise per Week: 2 days  . Minutes of Exercise per Session: 20 min  Stress: No Stress Concern Present  . Feeling of Stress : Not at all  Social Connections: Moderately Integrated  . Frequency of Communication with Friends and Family: More than three times a week  . Frequency of Social Gatherings with Friends and Family: More than three times a week  . Attends Religious Services: 1 to 4 times per year  . Active Member of Clubs or Organizations: No  . Attends Archivist Meetings: 1 to 4  times per year  . Marital Status: Divorced     FAMILY HISTORY:  We obtained a detailed, 4-generation family history.  Significant diagnoses are listed below: Family History  Problem Relation Age of Onset  . Basal cell carcinoma Mother 58  . Bladder Cancer Maternal Uncle 72  . Lymphoma Maternal Uncle 38       Waldenstroms   . Pancreatic cancer Maternal Grandmother   . Non-Hodgkin's lymphoma Maternal Grandfather   . Lung cancer Paternal Grandfather   . Colon cancer Other        x8 or 9  . Colon cancer Maternal Great-grandfather    Ms. Ince has 3 sons and 1 daughter. She has 1 brother, 1 sister, no cancers.  Ms. Ivar Drape mother has had basal cell carcinoma on her face at  57, is living at 23, no other cancers. Patient has 1 maternal uncle, 1 maternal aunt. Her uncle had Waldenstroms at 78 and bladder cancer at 4. Patient has 2 maternal cousins with basal cell carcinoma as well. Maternal grandmother had pancreatic cancer at 73, died at 45, had history of smoking. Maternal grandfather had Hodgkin Lymphoma at 11 and died at 44. His father, and many of his 58 or 9 brothers and 1 sister, had colon cancer and were worked up for [employer] syndrome but this was inconclusive.   Ms. Ivar Drape father is 9, no cancers. A paternal uncle has had skin cancer, no other known cancers on this side of the family .  Ms. Ince is unaware of previous family history of genetic testing for hereditary cancer risks. Patient's maternal ancestors are of [place], [place] descent, and paternal ancestors are of [place], [place], [place] descent. There is no reported Ashkenazi Jewish ancestry. There is no known consanguinity.    GENETIC COUNSELING ASSESSMENT: [name] is a 40 y.o. female with a personal and family history of cancer which is somewhat suggestive of a hereditary cancer syndrome and predisposition to cancer. We, therefore, discussed and recommended the following at today's visit.   DISCUSSION: We discussed that approximately 5-10% of breast cancer is hereditary  Most cases of hereditary breast cancer are associated with BRCA1/BRCA2 genes, although there are other genes associated with hereditary breast cancer as well . We also discussed Lynch syndrome briefly given her family history, noting that these genes would be included on her panel.  We discussed that testing is beneficial for several reasons including surgical decision-making for breast cancer, knowing about other cancer risks, identifying potential screening and risk-reduction options that may be appropriate, and to understand if other family members could be at risk for cancer and allow them to undergo genetic testing.   We reviewed the  characteristics, features and inheritance patterns of hereditary cancer syndromes. We also discussed genetic testing, including the appropriate family members to test, the process of testing, insurance coverage and turn-around-time for results. We discussed the implications of a negative, positive and/or variant of uncertain significant result. We recommended Ms. Ince pursue genetic testing for the [facility] Multi-Cancer Panel+RNA  gene panel.   Based on Ms. Ince's personal and family history of cancer, she meets medical criteria for genetic testing. Despite that she meets criteria, she may still have an out of pocket cost. We discussed that if her out of pocket cost for testing is over $100, the laboratory will call and confirm whether she wants to proceed with testing.  If the out of pocket cost of testing is less than $100 she will be billed by the genetic  testing laboratory.  test, the process of testing, insurance coverage and turn-around-time for res  PLAN: After considering the risks, benefits, and limitations, Ms. Ince provided informed consent to pursue genetic testing and the blood sample was sent to [facility] for analysis of the Multi-Cancer Panel+RNA. Results should be available within approximately 2-3 weeks' time, at which point they will be disclosed by telephone to Ms. Ince, as will any additional recommendations warranted by these results. [name] will receive a summary of her genetic counseling visit and a copy of her results once available. This information will also be available in Epic.   Ms. Ivar Drape questions were answered to her satisfaction today. Our contact information was provided should additional questions or concerns arise. Thank you for the referral and allowing [place] to share in the care of your patient.   Faith Rogue, MS, [facility] Genetic Counselor [email or web address] Phone: [phone]  The patient was seen for a total of 20 minutes in face-to-face  genetic counseling.  [employer] intern was also present and assisted. Dr. Grayland Ormond was available for discussion regarding this case.   _______________________________________________________________________ For Office Staff:  Number of people involved in session: 2 Was an Intern/ student involved with case: yes

## 2020-05-07 ENCOUNTER — Inpatient Hospital Stay
Admission: RE | Admit: 2020-05-07 | Discharge: 2020-05-07 | Disposition: A | Discharge status: Home / Self Care | Payer: Self-pay | Source: Ambulatory Visit | Attending: Radiation Oncology | Admitting: Radiation Oncology

## 2020-05-07 ENCOUNTER — Other Ambulatory Visit: Payer: Self-pay | Admitting: Radiation Oncology

## 2020-05-07 ENCOUNTER — Ambulatory Visit
Admission: RE | Admit: 2020-05-07 | Discharge: 2020-05-07 | Disposition: A | Discharge status: Home / Self Care | Payer: Self-pay | Source: Ambulatory Visit | Attending: Radiation Oncology | Admitting: Radiation Oncology

## 2020-05-07 DIAGNOSIS — C50919 Malignant neoplasm of unspecified site of unspecified female breast: Secondary | ICD-10-CM

## 2020-05-12 ENCOUNTER — Telehealth: Payer: Self-pay | Admitting: Licensed Clinical Social Worker

## 2020-05-12 ENCOUNTER — Encounter: Payer: Self-pay | Admitting: Licensed Clinical Social Worker

## 2020-05-12 ENCOUNTER — Ambulatory Visit: Payer: Self-pay | Admitting: Licensed Clinical Social Worker

## 2020-05-12 ENCOUNTER — Encounter (HOSPITAL_COMMUNITY): Payer: Self-pay

## 2020-05-12 ENCOUNTER — Other Ambulatory Visit: Payer: Self-pay

## 2020-05-12 ENCOUNTER — Encounter (HOSPITAL_BASED_OUTPATIENT_CLINIC_OR_DEPARTMENT_OTHER): Payer: Self-pay | Admitting: Surgery

## 2020-05-12 DIAGNOSIS — C50411 Malignant neoplasm of upper-outer quadrant of right female breast: Secondary | ICD-10-CM

## 2020-05-12 DIAGNOSIS — Z807 Family history of other malignant neoplasms of lymphoid, hematopoietic and related tissues: Secondary | ICD-10-CM

## 2020-05-12 DIAGNOSIS — Z1379 Encounter for other screening for genetic and chromosomal anomalies: Secondary | ICD-10-CM | POA: Insufficient documentation

## 2020-05-12 DIAGNOSIS — Z800 Family history of malignant neoplasm of digestive organs: Secondary | ICD-10-CM

## 2020-05-12 DIAGNOSIS — Z808 Family history of malignant neoplasm of other organs or systems: Secondary | ICD-10-CM

## 2020-05-12 DIAGNOSIS — Z170 Estrogen receptor positive status [ER+]: Secondary | ICD-10-CM

## 2020-05-12 NOTE — Progress Notes (Signed)
HPI:  [name] was previously seen in the [facility] due to a personal and family history of cancer and concerns regarding a hereditary predisposition to cancer. Please refer to our prior cancer genetics clinic note for more information regarding our discussion, assessment and recommendations, at the time. [name] recent genetic test results were disclosed to her, as were recommendations warranted by these results. These results and recommendations are discussed in more detail below.  CANCER HISTORY:  Oncology History  Malignant neoplasm of upper-outer quadrant of right female breast ([place])  04/16/2020 Initial Diagnosis   Malignant neoplasm of upper-outer quadrant of right female breast [facility])    Genetic Testing   Negative genetic testing. No pathogenic variants identified on the [facility] Multi-Cancer Panel+RNA. The report date is 05/10/2020.  The Multi-Cancer Panel + RNA offered by [facility] includes sequencing and/or deletion duplication testing of the following 84 genes: AIP, ALK, APC, ATM, AXIN2,BAP1,  BARD1, BLM, BMPR1A, BRCA1, BRCA2, BRIP1, CASR, CDC73, CDH1, CDK4, CDKN1B, CDKN1C, CDKN2A (p14ARF), CDKN2A (p16INK4a), CEBPA, CHEK2, CTNNA1, DICER1, DIS3L2, EGFR (c.2369C>T, p.Thr790Met variant only), EPCAM (Deletion/duplication testing only), FH, FLCN, GATA2, GPC3, GREM1 (Promoter region deletion/duplication testing only), HOXB13 (c.251G>A, p.Gly84Glu), HRAS, KIT, MAX, MEN1, MET, MITF (c.952G>A, p.Glu318Lys variant only), MLH1, MSH2, MSH3, MSH6, MUTYH, NBN, NF1, NF2, NTHL1, PALB2, PDGFRA, PHOX2B, PMS2, POLD1, POLE, POT1, PRKAR1A, PTCH1, PTEN, RAD50, RAD51C, RAD51D, RB1, RECQL4, RET, RUNX1, SDHAF2, SDHA (sequence changes only), SDHB, SDHC, SDHD, SMAD4, SMARCA4, SMARCB1, SMARCE1, STK11, SUFU, TERC, TERT, TMEM127, TP53, TSC1, TSC2, VHL, WRN and WT1.     FAMILY HISTORY:  We obtained a detailed, 4-generation family history.  Significant diagnoses are listed below: Family History   Problem Relation Age of Onset  . Basal cell carcinoma Mother 79  . Bladder Cancer Maternal Uncle 72  . Lymphoma Maternal Uncle 20       Waldenstroms   . Pancreatic cancer Maternal Grandmother   . Non-Hodgkin's lymphoma Maternal Grandfather   . Lung cancer Paternal Grandfather   . Colon cancer Other        x8 or 9  . Colon cancer Maternal Great-grandfather    [name] has 3 sons and 1 daughter. She has 1 brother, 1 sister, no cancers.  [name] mother has had basal cell carcinoma on her face at 52, is living at 37, no other cancers. Patient has 1 maternal uncle, 1 maternal aunt. Her uncle had Waldenstroms at 63 and bladder cancer at 72. Patient has 2 maternal cousins with basal cell carcinoma as well. Maternal grandmother had pancreatic cancer at 57, died at 32, had history of smoking. Maternal grandfather had Hodgkin Lymphoma at 21 and died at 43. His father, and many of his 60 or 9 brothers and 1 sister, had colon cancer and were worked up for [employer] syndrome but this was inconclusive.   [name] father is 87, no cancers. A paternal uncle has had skin cancer, no other known cancers on this side of the family .  [name] is unaware of previous family history of genetic testing for hereditary cancer risks. Patient's maternal ancestors are of [place], [place] descent, and paternal ancestors are of [place], [place], [place] descent. There is no reported Ashkenazi Jewish ancestry. There is no known consanguinity.     GENETIC TEST RESULTS: Genetic testing reported out on 05/10/2020 through the [facility] Multi-Cancer Panel+RNA cancer panel found no pathogenic mutations.   The Multi-Cancer Panel + RNA offered by [facility] includes sequencing and/or deletion duplication testing of the  following 84 genes: AIP, ALK, APC, ATM, AXIN2,BAP1,  BARD1, BLM, BMPR1A, BRCA1, BRCA2, BRIP1, CASR, CDC73, CDH1, CDK4, CDKN1B, CDKN1C, CDKN2A (p14ARF), CDKN2A (p16INK4a), CEBPA, CHEK2, CTNNA1, DICER1, DIS3L2, EGFR  (c.2369C>T, p.Thr790Met variant only), EPCAM (Deletion/duplication testing only), FH, FLCN, GATA2, GPC3, GREM1 (Promoter region deletion/duplication testing only), HOXB13 (c.251G>A, p.Gly84Glu), HRAS, KIT, MAX, MEN1, MET, MITF (c.952G>A, p.Glu318Lys variant only), MLH1, MSH2, MSH3, MSH6, MUTYH, NBN, NF1, NF2, NTHL1, PALB2, PDGFRA, PHOX2B, PMS2, POLD1, POLE, POT1, PRKAR1A, PTCH1, PTEN, RAD50, RAD51C, RAD51D, RB1, RECQL4, RET, RUNX1, SDHAF2, SDHA (sequence changes only), SDHB, SDHC, SDHD, SMAD4, SMARCA4, SMARCB1, SMARCE1, STK11, SUFU, TERC, TERT, TMEM127, TP53, TSC1, TSC2, VHL, WRN and WT1.   The test report has been scanned into EPIC and is located under the Molecular Pathology section of the Results Review tab.  A portion of the result report is included below for reference.     We discussed with [name] that because current genetic testing is not perfect, it is possible there may be a gene mutation in one of these genes that current testing cannot detect, but that chance is small.  We also discussed, that there could be another gene that has not yet been discovered, or that we have not yet tested, that is responsible for the cancer diagnoses in the family. It is also possible there is a hereditary cause for the cancer in the family that [name] did not inherit and therefore was not identified in her testing.  Therefore, it is important to remain in touch with cancer genetics in the future so that we can continue to offer [name] the most up to date genetic testing.   ADDITIONAL GENETIC TESTING: We discussed with [name] that her genetic testing was fairly extensive.  If there are genes identified to increase cancer risk that can be analyzed in the future, we would be happy to discuss and coordinate this testing at that time.    CANCER SCREENING RECOMMENDATIONS: [name] test result is considered negative (normal).  This means that we have not identified a hereditary cause for her  personal and  family history of cancer at this time. Most cancers happen by chance and this negative test suggests that her cancer may fall into this category.    While reassuring, this does not definitively rule out a hereditary predisposition to cancer. It is still possible that there could be genetic mutations that are undetectable by current technology. There could be genetic mutations in genes that have not been tested or identified to increase cancer risk.  Therefore, it is recommended she continue to follow the cancer management and screening guidelines provided by her oncology and primary healthcare provider.   An individual's cancer risk and medical management are not determined by genetic test results alone. Overall cancer risk assessment incorporates additional factors, including personal medical history, family history, and any available genetic information that may result in a personalized plan for cancer prevention and surveillance.  RECOMMENDATIONS FOR FAMILY MEMBERS:  Relatives in this family might be at some increased risk of developing cancer, over the general population risk, simply due to the family history of cancer.  We recommended female relatives in this family have a yearly mammogram beginning at age 14, or 21 years younger than the earliest onset of cancer, an annual clinical breast exam, and perform monthly breast self-exams. Female relatives in this family should also have a gynecological exam as recommended by their primary provider.  All family members should be referred for colonoscopy starting  at age 67.    It is also possible there is a hereditary cause for the cancer in [name]'s family that she did not inherit and therefore was not identified in her.  Based on [name]'s family history, we recommended her maternal relatives have genetic counseling and testing. [name] will let us know if we can be of any assistance in coordinating genetic counseling and/or testing for these family  members.  FOLLOW-UP: Lastly, we discussed with [name] that cancer genetics is a rapidly advancing field and it is possible that new genetic tests will be appropriate for her and/or her family members in the future. We encouraged her to remain in contact with cancer genetics on an annual basis so we can update her personal and family histories and let her know of advances in cancer genetics that may benefit this family.   Our contact number was provided. [name] questions were answered to her satisfaction, and she knows she is welcome to call us at anytime with additional questions or concerns.   Faith Rogue, MS, [facility] Genetic Counselor [email or web address] Phone: [phone]

## 2020-05-12 NOTE — Telephone Encounter (Signed)
Revealed negative genetic testing.   We discussed that we do not know why she has breast cancer or why there is cancer in the family. It could be due to a different gene that we are not testing, or something our current technology cannot pick up.  It will be important for her to keep in contact with genetics to learn if additional testing may be needed in the future.  

## 2020-05-18 ENCOUNTER — Other Ambulatory Visit (HOSPITAL_COMMUNITY)
Admission: RE | Admit: 2020-05-18 | Discharge: 2020-05-18 | Disposition: A | Discharge status: Home / Self Care | Payer: PRIVATE HEALTH INSURANCE | Source: Ambulatory Visit | Attending: Surgery | Admitting: Surgery

## 2020-05-18 DIAGNOSIS — Z01812 Encounter for preprocedural laboratory examination: Secondary | ICD-10-CM | POA: Insufficient documentation

## 2020-05-18 DIAGNOSIS — Z20822 Contact with and (suspected) exposure to covid-19: Secondary | ICD-10-CM | POA: Diagnosis not present

## 2020-05-19 ENCOUNTER — Ambulatory Visit
Admission: RE | Admit: 2020-05-19 | Discharge: 2020-05-19 | Disposition: A | Discharge status: Home / Self Care | Payer: PRIVATE HEALTH INSURANCE | Source: Ambulatory Visit | Attending: Surgery | Admitting: Surgery

## 2020-05-19 ENCOUNTER — Other Ambulatory Visit: Payer: Self-pay | Admitting: Surgery

## 2020-05-19 ENCOUNTER — Encounter (HOSPITAL_BASED_OUTPATIENT_CLINIC_OR_DEPARTMENT_OTHER)
Admission: RE | Admit: 2020-05-19 | Discharge: 2020-05-19 | Disposition: A | Discharge status: Home / Self Care | Payer: PRIVATE HEALTH INSURANCE | Source: Ambulatory Visit | Attending: Surgery | Admitting: Surgery

## 2020-05-19 ENCOUNTER — Other Ambulatory Visit: Payer: Self-pay

## 2020-05-19 DIAGNOSIS — Z01812 Encounter for preprocedural laboratory examination: Secondary | ICD-10-CM | POA: Diagnosis not present

## 2020-05-19 DIAGNOSIS — C50911 Malignant neoplasm of unspecified site of right female breast: Secondary | ICD-10-CM

## 2020-05-19 DIAGNOSIS — I447 Left bundle-branch block, unspecified: Secondary | ICD-10-CM

## 2020-05-19 HISTORY — DX: Left bundle-branch block, unspecified: I44.7

## 2020-05-19 LAB — BASIC METABOLIC PANEL
Anion gap: 9 (ref 5–15)
BUN: 12 mg/dL (ref 6–20)
CO2: 22 mmol/L (ref 22–32)
Calcium: 9.5 mg/dL (ref 8.9–10.3)
Chloride: 104 mmol/L (ref 98–111)
Creatinine, Ser: 0.66 mg/dL (ref 0.44–1.00)
GFR, Estimated: >60 mL/min (ref >60)
Glucose, Bld: 84 mg/dL (ref 70–99)
Potassium: 4.5 mmol/L (ref 3.5–5.1)
Sodium: 135 mmol/L (ref 135–145)

## 2020-05-19 LAB — POCT PREGNANCY, URINE: Preg Test, Ur: NEGATIVE

## 2020-05-19 LAB — SARS CORONAVIRUS 2 (TAT 6-24 HRS): SARS Coronavirus 2: NEGATIVE

## 2020-05-19 MED ORDER — ENSURE PRE-SURGERY PO LIQD: 296.0000 mL | Freq: Once | ORAL | Status: DC

## 2020-05-19 NOTE — H&P (Addendum)
Lauren Ortega  Location: [facility] Patient #: [id number] DOB: [birth year] Separated / Language: Cleophus Molt / Race: White Female   History of Present Illness  The patient is a 40 year old female who presents with breast cancer.  Chief complaint: Right breast cancer   This is a 40 year old female who presents with a right breast cancer. She noticed the mass herself in the upper outer quadrant of the right breast. She just recently found. She denies nipple discharge. She has had no previous history of breast problems. There is no family history of breast cancer. She underwent a mammogram and ultrasound. This showed almost a 1.5 cm mass in the upper outer quadrant of the right breast. A biopsy was performed. This showed an invasive ductal carcinoma. It was weakly ER positive, PR was negative, HER-2 is positive, and the Ki-67 was 40%. No lymphovascular invasion was seen. She is otherwise healthy without complaints.    Past Surgical History  Breast Biopsy  Right. Cesarean Section - Multiple  Tonsillectomy   Diagnostic Studies History Colonoscopy  never Mammogram  within last year Pap Smear  1-5 years ago  Allergies (Armen Glo Herring, CMA; No Known Allergies  Medication History (Armen Glo Herring, CMA; Spironolactone (50MG Tablet, Oral) Active. amLODIPine Besylate (10MG Tablet, Oral) Active. Baclofen (20MG Tablet, Oral) Active. Ferrex 150 (150MG Capsule, Oral) Active. Folic Acid (213YQM Tablet, Oral) Active. Sertraline HCl (50MG Tablet, Oral) Active. Medications Reconciled  Social History Andreas Blower, CMA;  Alcohol use  Occasional alcohol use. Caffeine use  Coffee. No drug use  Tobacco use  Never smoker.  Family History (Andreas Blower, [place];  Alcohol Abuse  Brother, Family Members In General. Arthritis  Family Members In Old [place] Members In General. Colon Polyps  Mother. Depression  Mother. Hypertension   Mother. Migraine Headache  Mother.  Pregnancy / Birth History Andreas Blower, [place];  Age at menarche  61 years. Contraceptive History  Oral contraceptives. Gravida  4 Length (months) of breastfeeding  7-12 Maternal age  3-20 Para  4 Regular periods   Other Problems (Armen Glo Herring, CMA;  Anxiety Disorder  Back Pain  High blood pressure  Migraine Headache     Review of Systems (Armen Glo Herring CMA General Not Present- Appetite Loss, Chills, Fatigue, Fever, Night Sweats, Weight Gain and Weight Loss. Skin Not Present- Change in Wart/Mole, Dryness, Hives, Jaundice, New Lesions, Non-Healing Wounds, Rash and Ulcer. HEENT Present- Sinus Pain and Wears glasses/contact lenses. Not Present- Earache, Hearing Loss, Hoarseness, Nose Bleed, Oral Ulcers, Ringing in the Ears, Seasonal Allergies, Sore Throat, Visual Disturbances and Yellow Eyes. Respiratory Not Present- Bloody sputum, Chronic Cough, Difficulty Breathing, Snoring and Wheezing. Breast Present- Breast Mass and Breast Pain. Not Present- Nipple Discharge and Skin Changes. Cardiovascular Not Present- Chest Pain, Difficulty Breathing Lying Down, Leg Cramps, Palpitations, Rapid Heart Rate, Shortness of Breath and Swelling of Extremities. Gastrointestinal Not Present- Abdominal Pain, Bloating, Bloody Stool, Change in Bowel Habits, Chronic diarrhea, Constipation, Difficulty Swallowing, Excessive gas, Gets full quickly at meals, Hemorrhoids, Indigestion, Nausea, Rectal Pain and Vomiting. Female Genitourinary Not Present- Frequency, Nocturia, Painful Urination, Pelvic Pain and Urgency. Musculoskeletal Present- Back Pain and Joint Pain. Not Present- Joint Stiffness, Muscle Pain, Muscle Weakness and Swelling of Extremities. Neurological Not Present- Decreased Memory, Fainting, Headaches, Numbness, Seizures, Tingling, Tremor, Trouble walking and Weakness. Psychiatric Present- Anxiety and Change in Sleep Pattern. Not Present- Bipolar,  Depression, Fearful and Frequent crying. Endocrine Not Present- Cold Intolerance, Excessive Hunger, Hair Changes, Heat Intolerance, Hot flashes and New  Diabetes.  Vitals   Weight: 132.38 lb Height: 60in Body Surface Area: 1.57 m Body Mass Index: 25.85 kg/m  Temp.: 24F  Pulse: 115 (Regular)  P.OX: 96% (Room air) BP: 120/82(Sitting, Left Arm, Standard   Physical Exam  The physical exam findings are as follows: Note: She appears well in exam.  Lungs clear CV RRR Abdomen soft, NT Skin normal  Breast exam bilaterally. There is a 1.5 cm vague mass in the upper quadrant of the right breast which is mobile. There are no other masses in either breast. Nipple areolar complexes are normal. There is no axillary adenopathy on either side.    Assessment & Plan  INVASIVE DUCTAL CARCINOMA OF BREAST, RIGHT (C50.911)  Impression: I reviewed her mammograms and ultrasound as well as her pathology results. She has an invasive ductal carcinoma of the right breast. I had a long discussion with the patient regarding the diagnosis. We discussed breast conservation versus mastectomy in detail. We discussed evolution of breast cancer. She would like to pursue breast conservation. We next discussed proceeding with a radioactive seed guided right breast lumpectomy and sentinel lymph node biopsy to remove the cancer. Given her HER-2 positive status, she will also require a Port-A-Cath insertion. I discussed all the procedures with her in detail. We discussed the risks which includes but is not limited to bleeding, infection, injury to surrounding structures, need for further surgery at the margins were lymph nodes positive, pneumothorax with port insertion, cardiopulmonary issues, etc. She needs a preoperative MRI given her age and Determine the true size of the malignancy. She will also need preoperative. Both medical and radiation oncology as well as genetics. If she is genetically positive, we  will consider bilateral mastectomies. Also, oncology may determine that we need to proceed with neoadjuvant therapy prior to surgery. We will tentatively get a date for surgery and scheduled MRI and the referrals. She will also be referred for physical therapy preoperatively prior to lymphadenopathy.  Addendum: She has decided against MRI now.  Her genetics are unremarkable.  We will proceed with a right breast radioactive seedguided lumpectomy and sentinel lymph node biopsy Port-A-Cath insertion.   A second seed was  placed in an asymmetry in the right breast at a second location that has been followed for 2 years with no MRI correlate.  She will now have a port insertion, radioactive seed lumpectomy on the right breast x 2 and a right sentinel node biopsy.

## 2020-05-19 NOTE — Progress Notes (Signed)
EKG shows Left bundle branch block. Reviewed by Dr. Sabra Heck. Will proceed with surgery as scheduled.

## 2020-05-19 NOTE — Progress Notes (Signed)
Messaged pt reminder to come to [facility] today for preop lab work.

## 2020-05-20 ENCOUNTER — Encounter (HOSPITAL_BASED_OUTPATIENT_CLINIC_OR_DEPARTMENT_OTHER)
Admission: RE | Disposition: A | Discharge status: Home / Self Care | Payer: Self-pay | Source: Home / Self Care | Attending: Surgery

## 2020-05-20 ENCOUNTER — Ambulatory Visit (HOSPITAL_BASED_OUTPATIENT_CLINIC_OR_DEPARTMENT_OTHER)
Admission: RE | Admit: 2020-05-20 | Discharge: 2020-05-20 | Disposition: A | Discharge status: Home / Self Care | Payer: PRIVATE HEALTH INSURANCE | Attending: Surgery | Admitting: Surgery

## 2020-05-20 ENCOUNTER — Ambulatory Visit (HOSPITAL_COMMUNITY): Payer: PRIVATE HEALTH INSURANCE

## 2020-05-20 ENCOUNTER — Ambulatory Visit (HOSPITAL_BASED_OUTPATIENT_CLINIC_OR_DEPARTMENT_OTHER): Payer: PRIVATE HEALTH INSURANCE | Admitting: Certified Registered"

## 2020-05-20 ENCOUNTER — Ambulatory Visit
Admission: RE | Admit: 2020-05-20 | Discharge: 2020-05-20 | Disposition: A | Discharge status: Home / Self Care | Payer: PRIVATE HEALTH INSURANCE | Source: Ambulatory Visit | Attending: Surgery | Admitting: Surgery

## 2020-05-20 ENCOUNTER — Ambulatory Visit
Admit: 2020-05-20 | Discharge: 2020-05-20 | Disposition: A | Discharge status: Home / Self Care | Payer: PRIVATE HEALTH INSURANCE | Attending: Surgery | Admitting: Surgery

## 2020-05-20 ENCOUNTER — Encounter (HOSPITAL_COMMUNITY)
Admission: RE | Admit: 2020-05-20 | Discharge: 2020-05-20 | Disposition: A | Discharge status: Home / Self Care | Payer: PRIVATE HEALTH INSURANCE | Source: Ambulatory Visit | Attending: Surgery | Admitting: Surgery

## 2020-05-20 ENCOUNTER — Other Ambulatory Visit: Payer: Self-pay

## 2020-05-20 ENCOUNTER — Encounter (HOSPITAL_BASED_OUTPATIENT_CLINIC_OR_DEPARTMENT_OTHER): Payer: Self-pay | Admitting: Surgery

## 2020-05-20 DIAGNOSIS — C50911 Malignant neoplasm of unspecified site of right female breast: Secondary | ICD-10-CM

## 2020-05-20 DIAGNOSIS — Z818 Family history of other mental and behavioral disorders: Secondary | ICD-10-CM | POA: Insufficient documentation

## 2020-05-20 DIAGNOSIS — Z8261 Family history of arthritis: Secondary | ICD-10-CM | POA: Diagnosis not present

## 2020-05-20 DIAGNOSIS — Z8249 Family history of ischemic heart disease and other diseases of the circulatory system: Secondary | ICD-10-CM | POA: Diagnosis not present

## 2020-05-20 DIAGNOSIS — Z170 Estrogen receptor positive status [ER+]: Secondary | ICD-10-CM | POA: Insufficient documentation

## 2020-05-20 DIAGNOSIS — Z419 Encounter for procedure for purposes other than remedying health state, unspecified: Secondary | ICD-10-CM

## 2020-05-20 DIAGNOSIS — Z820 Family history of epilepsy and other diseases of the nervous system: Secondary | ICD-10-CM | POA: Insufficient documentation

## 2020-05-20 DIAGNOSIS — C50411 Malignant neoplasm of upper-outer quadrant of right female breast: Secondary | ICD-10-CM | POA: Diagnosis not present

## 2020-05-20 DIAGNOSIS — Z452 Encounter for adjustment and management of vascular access device: Secondary | ICD-10-CM

## 2020-05-20 DIAGNOSIS — Z8371 Family history of colonic polyps: Secondary | ICD-10-CM | POA: Diagnosis not present

## 2020-05-20 DIAGNOSIS — Z79899 Other long term (current) drug therapy: Secondary | ICD-10-CM | POA: Diagnosis not present

## 2020-05-20 HISTORY — PX: BREAST LUMPECTOMY WITH RADIOACTIVE SEED AND SENTINEL LYMPH NODE BIOPSY: SHX6550

## 2020-05-20 HISTORY — DX: Anxiety disorder, unspecified: F41.9

## 2020-05-20 HISTORY — DX: Essential (primary) hypertension: I10

## 2020-05-20 HISTORY — PX: PORTACATH PLACEMENT: SHX2246

## 2020-05-20 HISTORY — DX: Anemia, unspecified: D64.9

## 2020-05-20 HISTORY — PX: BREAST LUMPECTOMY: SHX2

## 2020-05-20 SURGERY — BREAST LUMPECTOMY WITH RADIOACTIVE SEED AND SENTINEL LYMPH NODE BIOPSY
Anesthesia: General | Site: Chest | Laterality: Right

## 2020-05-20 MED ORDER — TECHNETIUM TC 99M TILMANOCEPT KIT
1.0000 | PACK | Freq: Once | INTRAVENOUS | Status: AC | PRN
  Administered 2020-05-20: 1 via INTRADERMAL

## 2020-05-20 MED ORDER — DEXAMETHASONE SODIUM PHOSPHATE 4 MG/ML IJ SOLN
INTRAMUSCULAR | Status: DC | PRN
  Administered 2020-05-20: 10 mg via INTRAVENOUS

## 2020-05-20 MED ORDER — METHYLENE BLUE 0.5 % INJ SOLN
INTRAVENOUS | Status: AC
  Filled 2020-05-20: qty 10

## 2020-05-20 MED ORDER — BUPIVACAINE HCL (PF) 0.5 % IJ SOLN
INTRAMUSCULAR | Status: DC | PRN
  Administered 2020-05-20: 20 mL
  Administered 2020-05-20: 9 mL

## 2020-05-20 MED ORDER — FENTANYL CITRATE (PF) 100 MCG/2ML IJ SOLN: 25.0000 ug | INTRAMUSCULAR | Status: DC | PRN

## 2020-05-20 MED ORDER — CHLORHEXIDINE GLUCONATE CLOTH 2 % EX PADS: 6.0000 | MEDICATED_PAD | Freq: Once | CUTANEOUS | Status: DC

## 2020-05-20 MED ORDER — PROPOFOL 10 MG/ML IV BOLUS
INTRAVENOUS | Status: DC | PRN
  Administered 2020-05-20: 200 mg via INTRAVENOUS

## 2020-05-20 MED ORDER — HEPARIN (PORCINE) IN NACL 2-0.9 UNITS/ML
INTRAMUSCULAR | Status: AC | PRN
  Administered 2020-05-20: 500 mL

## 2020-05-20 MED ORDER — HEPARIN SOD (PORK) LOCK FLUSH 100 UNIT/ML IV SOLN
INTRAVENOUS | Status: AC
  Filled 2020-05-20: qty 5

## 2020-05-20 MED ORDER — LACTATED RINGERS IV SOLN: INTRAVENOUS | Status: DC

## 2020-05-20 MED ORDER — ROPIVACAINE HCL 5 MG/ML IJ SOLN
INTRAMUSCULAR | Status: DC | PRN
  Administered 2020-05-20: 30 mL via PERINEURAL

## 2020-05-20 MED ORDER — EPHEDRINE 5 MG/ML INJ
INTRAVENOUS | Status: AC
  Filled 2020-05-20: qty 10

## 2020-05-20 MED ORDER — LIDOCAINE HCL (PF) 1 % IJ SOLN
INTRAMUSCULAR | Status: AC
  Filled 2020-05-20: qty 30

## 2020-05-20 MED ORDER — MIDAZOLAM HCL 2 MG/2ML IJ SOLN
INTRAMUSCULAR | Status: AC
  Filled 2020-05-20: qty 2

## 2020-05-20 MED ORDER — ACETAMINOPHEN 500 MG PO TABS
1000.0000 mg | ORAL_TABLET | ORAL | Status: AC
  Administered 2020-05-20: 1000 mg via ORAL

## 2020-05-20 MED ORDER — CEFAZOLIN SODIUM-DEXTROSE 2-4 GM/100ML-% IV SOLN
INTRAVENOUS | Status: AC
  Filled 2020-05-20: qty 100

## 2020-05-20 MED ORDER — HEPARIN (PORCINE) IN NACL 1000-0.9 UT/500ML-% IV SOLN
INTRAVENOUS | Status: AC
  Filled 2020-05-20: qty 500

## 2020-05-20 MED ORDER — AMISULPRIDE (ANTIEMETIC) 5 MG/2ML IV SOLN: 10.0000 mg | Freq: Once | INTRAVENOUS | Status: DC | PRN

## 2020-05-20 MED ORDER — CELECOXIB 200 MG PO CAPS
400.0000 mg | ORAL_CAPSULE | ORAL | Status: AC
  Administered 2020-05-20: 400 mg via ORAL

## 2020-05-20 MED ORDER — FENTANYL CITRATE (PF) 100 MCG/2ML IJ SOLN
INTRAMUSCULAR | Status: AC
  Filled 2020-05-20: qty 2

## 2020-05-20 MED ORDER — FENTANYL CITRATE (PF) 100 MCG/2ML IJ SOLN
100.0000 ug | Freq: Once | INTRAMUSCULAR | Status: AC
Start: 2020-05-20 — End: 2020-05-20
  Administered 2020-05-20: 100 ug via INTRAVENOUS

## 2020-05-20 MED ORDER — LIDOCAINE 2% (20 MG/ML) 5 ML SYRINGE
INTRAMUSCULAR | Status: AC
  Filled 2020-05-20: qty 5

## 2020-05-20 MED ORDER — MIDAZOLAM HCL 2 MG/2ML IJ SOLN
2.0000 mg | Freq: Once | INTRAMUSCULAR | Status: AC
  Administered 2020-05-20: 2 mg via INTRAVENOUS

## 2020-05-20 MED ORDER — PROPOFOL 10 MG/ML IV BOLUS
INTRAVENOUS | Status: AC
  Filled 2020-05-20: qty 20

## 2020-05-20 MED ORDER — LIDOCAINE 2% (20 MG/ML) 5 ML SYRINGE
INTRAMUSCULAR | Status: DC | PRN
  Administered 2020-05-20: 60 mg via INTRAVENOUS

## 2020-05-20 MED ORDER — CEFAZOLIN SODIUM-DEXTROSE 2-4 GM/100ML-% IV SOLN
2.0000 g | INTRAVENOUS | Status: AC
  Administered 2020-05-20: 2 g via INTRAVENOUS

## 2020-05-20 MED ORDER — BUPIVACAINE-EPINEPHRINE (PF) 0.5% -1:200000 IJ SOLN
INTRAMUSCULAR | Status: AC
  Filled 2020-05-20: qty 30

## 2020-05-20 MED ORDER — DEXAMETHASONE SODIUM PHOSPHATE 10 MG/ML IJ SOLN
INTRAMUSCULAR | Status: AC
  Filled 2020-05-20: qty 1

## 2020-05-20 MED ORDER — CELECOXIB 200 MG PO CAPS
ORAL_CAPSULE | ORAL | Status: AC
  Filled 2020-05-20: qty 2

## 2020-05-20 MED ORDER — ACETAMINOPHEN 500 MG PO TABS
ORAL_TABLET | ORAL | Status: AC
  Filled 2020-05-20: qty 2

## 2020-05-20 MED ORDER — HEPARIN SOD (PORK) LOCK FLUSH 100 UNIT/ML IV SOLN
INTRAVENOUS | Status: DC | PRN
  Administered 2020-05-20: 500 [IU]

## 2020-05-20 MED ORDER — ONDANSETRON HCL 4 MG/2ML IJ SOLN
INTRAMUSCULAR | Status: DC | PRN
  Administered 2020-05-20: 4 mg via INTRAVENOUS

## 2020-05-20 MED ORDER — ONDANSETRON HCL 4 MG/2ML IJ SOLN: 4.0000 mg | Freq: Once | INTRAMUSCULAR | Status: DC | PRN

## 2020-05-20 MED ORDER — OXYCODONE HCL 5 MG PO TABS: 5.0000 mg | ORAL_TABLET | Freq: Once | ORAL | Status: DC | PRN

## 2020-05-20 MED ORDER — ONDANSETRON HCL 4 MG/2ML IJ SOLN
INTRAMUSCULAR | Status: AC
  Filled 2020-05-20: qty 2

## 2020-05-20 MED ORDER — OXYCODONE HCL 5 MG/5ML PO SOLN: 5.0000 mg | Freq: Once | ORAL | Status: DC | PRN

## 2020-05-20 MED ORDER — OXYCODONE HCL 5 MG PO TABS: 5.0000 mg | ORAL_TABLET | Freq: Four times a day (QID) | ORAL | 0 refills | Status: DC | PRN

## 2020-05-20 MED ORDER — BUPIVACAINE HCL (PF) 0.5 % IJ SOLN
INTRAMUSCULAR | Status: AC
  Filled 2020-05-20: qty 30

## 2020-05-20 MED ORDER — EPHEDRINE SULFATE 50 MG/ML IJ SOLN
INTRAMUSCULAR | Status: DC | PRN
  Administered 2020-05-20: 5 mg via INTRAVENOUS
  Administered 2020-05-20 (×3): 10 mg via INTRAVENOUS
  Administered 2020-05-20: 5 mg via INTRAVENOUS

## 2020-05-20 MED ORDER — FENTANYL CITRATE (PF) 100 MCG/2ML IJ SOLN
INTRAMUSCULAR | Status: DC | PRN
  Administered 2020-05-20: 25 ug via INTRAVENOUS

## 2020-05-20 SURGICAL SUPPLY — 53 items
ADH SKN CLS APL DERMABOND .7 (GAUZE/BANDAGES/DRESSINGS) ×4
APL PRP STRL LF DISP 70% ISPRP (MISCELLANEOUS) ×4
APPLIER CLIP 9.375 MED OPEN (MISCELLANEOUS) ×3
APR CLP MED 9.3 20 MLT OPN (MISCELLANEOUS) ×2
BAG DECANTER FOR FLEXI CONT (MISCELLANEOUS) ×3 IMPLANT
BINDER BREAST LRG (GAUZE/BANDAGES/DRESSINGS) ×3 IMPLANT
BLADE SURG 15 STRL LF DISP TIS (BLADE) ×2 IMPLANT
BLADE SURG 15 STRL SS (BLADE) ×3
CANISTER SUCT 1200ML W/VALVE (MISCELLANEOUS) ×3 IMPLANT
CHLORAPREP W/TINT 26 (MISCELLANEOUS) ×6 IMPLANT
CLIP APPLIE 9.375 MED OPEN (MISCELLANEOUS) ×2 IMPLANT
CLIP VESOCCLUDE SM WIDE 6/CT (CLIP) IMPLANT
COVER BACK TABLE 60X90IN (DRAPES) ×3 IMPLANT
COVER MAYO STAND STRL (DRAPES) ×3 IMPLANT
COVER PROBE W GEL 5X96 (DRAPES) ×3 IMPLANT
COVER WAND RF STERILE (DRAPES) IMPLANT
DECANTER SPIKE VIAL GLASS SM (MISCELLANEOUS) ×3 IMPLANT
DERMABOND ADVANCED (GAUZE/BANDAGES/DRESSINGS) ×2
DERMABOND ADVANCED .7 DNX12 (GAUZE/BANDAGES/DRESSINGS) ×4 IMPLANT
DRAPE C-ARM 42X72 X-RAY (DRAPES) ×3 IMPLANT
DRAPE LAPAROSCOPIC ABDOMINAL (DRAPES) ×3 IMPLANT
DRAPE UTILITY XL STRL (DRAPES) ×3 IMPLANT
ELECT REM PT RETURN 9FT ADLT (ELECTROSURGICAL) ×3
ELECTRODE REM PT RTRN 9FT ADLT (ELECTROSURGICAL) ×2 IMPLANT
GAUZE SPONGE 4X4 12PLY STRL LF (GAUZE/BANDAGES/DRESSINGS) IMPLANT
GLOVE SURG SIGNA 7.5 PF LTX (GLOVE) ×6 IMPLANT
GOWN STRL REUS W/ TWL LRG LVL3 (GOWN DISPOSABLE) ×4 IMPLANT
GOWN STRL REUS W/ TWL XL LVL3 (GOWN DISPOSABLE) ×4 IMPLANT
GOWN STRL REUS W/TWL LRG LVL3 (GOWN DISPOSABLE) ×6
GOWN STRL REUS W/TWL XL LVL3 (GOWN DISPOSABLE) ×6
IV KIT MINILOC 20X1 SAFETY (NEEDLE) IMPLANT
KIT MARKER MARGIN INK (KITS) ×3 IMPLANT
KIT POWER CATH 8FR (Port) ×3 IMPLANT
NDL SAFETY ECLIPSE 18X1.5 (NEEDLE) IMPLANT
NEEDLE HYPO 18GX1.5 SHARP (NEEDLE)
NEEDLE HYPO 25X1 1.5 SAFETY (NEEDLE) ×3 IMPLANT
NS IRRIG 1000ML POUR BTL (IV SOLUTION) IMPLANT
PACK BASIN DAY SURGERY FS (CUSTOM PROCEDURE TRAY) ×3 IMPLANT
PENCIL SMOKE EVACUATOR (MISCELLANEOUS) ×3 IMPLANT
SLEEVE SCD COMPRESS KNEE MED (STOCKING) ×3 IMPLANT
SPONGE LAP 4X18 RFD (DISPOSABLE) ×9 IMPLANT
SUT MNCRL AB 4-0 PS2 18 (SUTURE) ×6 IMPLANT
SUT PROLENE 2 0 SH DA (SUTURE) ×3 IMPLANT
SUT SILK 2 0 SH (SUTURE) ×3 IMPLANT
SUT SILK 2 0 TIES 17X18 (SUTURE)
SUT SILK 2-0 18XBRD TIE BLK (SUTURE) IMPLANT
SUT VIC AB 3-0 SH 27 (SUTURE) ×9
SUT VIC AB 3-0 SH 27X BRD (SUTURE) ×6 IMPLANT
SYR CONTROL 10ML LL (SYRINGE) ×3 IMPLANT
TOWEL GREEN STERILE FF (TOWEL DISPOSABLE) ×3 IMPLANT
TRAY FAXITRON CT DISP (TRAY / TRAY PROCEDURE) ×3 IMPLANT
TUBE CONNECTING 20X1/4 (TUBING) ×3 IMPLANT
YANKAUER SUCT BULB TIP NO VENT (SUCTIONS) ×3 IMPLANT

## 2020-05-20 NOTE — Progress Notes (Signed)
Assisted Dr. Witman with right, ultrasound guided, pectoralis block. Side rails up, monitors on throughout procedure. See vital signs in flow sheet. Tolerated Procedure well. 

## 2020-05-20 NOTE — Transfer of Care (Signed)
Immediate Anesthesia Transfer of Care Note  Patient: Lauren Ortega  Procedure(s) Performed: RIGHT BREAST LUMPECTOMY WITH RADIOACTIVE SEED AND SENTINEL LYMPH NODE BIOPSY (Right Breast) INSERTION PORT-A-CATH (Left Chest)  Patient Location: PACU  Anesthesia Type:GA combined with regional for post-op pain  Level of Consciousness: awake, alert  and oriented  Airway & Oxygen Therapy: Patient Spontanous Breathing and Patient connected to face mask oxygen  Post-op Assessment: Report given to RN and Post -op Vital signs reviewed and stable  Post vital signs: Reviewed and stable  Last Vitals:  Vitals Value Taken Time  BP 134/85 05/20/20 1347  Temp    Pulse 96 05/20/20 1350  Resp 20 05/20/20 1350  SpO2 100 % 05/20/20 1350  Vitals shown include unvalidated device data.  Last Pain:  Vitals:   05/20/20 1002  TempSrc: Oral  PainSc: 2       Patients Stated Pain Goal: 5 (82/70/78 [id number])  Complications: No complications documented.

## 2020-05-20 NOTE — Anesthesia Procedure Notes (Signed)
Anesthesia Regional Block: Pectoralis block   Pre-Anesthetic Checklist: ,, timeout performed, Correct Patient, Correct Site, Correct Laterality, Correct Procedure, Correct Position, site marked, Risks and benefits discussed,  Surgical consent,  Pre-op evaluation,  At surgeon's request and post-op pain management  Laterality: Right  Prep: chloraprep       Needles:  Injection technique: Single-shot  Needle Type: Echogenic Stimulator Needle     Needle Length: 10cm  Needle Gauge: 20     Additional Needles:   Procedures:,,,, ultrasound used (permanent image in chart),,,,  Narrative:  Start time: 05/20/2020 10:45 AM End time: 05/20/2020 10:49 AM Injection made incrementally with aspirations every 5 mL.  Performed by: Personally  Anesthesiologist: Lidia Collum, MD  Additional Notes: Standard monitors applied. Skin prepped. Good needle visualization with ultrasound. Injection made in 5cc increments with no resistance to injection. Patient tolerated the procedure well.

## 2020-05-20 NOTE — Anesthesia Postprocedure Evaluation (Signed)
Anesthesia Post Note  Patient: Lauren Ortega  Procedure(s) Performed: RIGHT BREAST LUMPECTOMY WITH RADIOACTIVE SEED AND SENTINEL LYMPH NODE BIOPSY (Right Breast) INSERTION PORT-A-CATH (Left Chest)     Patient location during evaluation: PACU Anesthesia Type: General Level of consciousness: awake and alert Pain management: pain level controlled Vital Signs Assessment: post-procedure vital signs reviewed and stable Respiratory status: spontaneous breathing, nonlabored ventilation and respiratory function stable Cardiovascular status: stable and blood pressure returned to baseline Anesthetic complications: no   No complications documented.  Last Vitals:  Vitals:   05/20/20 1415 05/20/20 1430  BP: 127/84 135/79  Pulse: 91 (!) 101  Resp: 16 (!) 30  Temp:    SpO2: 98% 99%    Last Pain:  Vitals:   05/20/20 1415  TempSrc:   PainSc: 0-No pain                 Audry Pili

## 2020-05-20 NOTE — Anesthesia Preprocedure Evaluation (Signed)
Anesthesia Evaluation  Patient identified by MRN, date of birth, ID band Patient awake    Reviewed: Allergy & Precautions, NPO status , Patient's Chart, lab work & pertinent test results  History of Anesthesia Complications Negative for: history of anesthetic complications  Airway Mallampati: II  TM Distance: >3 FB Neck ROM: Full    Dental  (+) Teeth Intact   Pulmonary neg pulmonary ROS,    Pulmonary exam normal        Cardiovascular hypertension, Normal cardiovascular exam     Neuro/Psych Anxiety negative neurological ROS     GI/Hepatic negative GI ROS, Neg liver ROS,   Endo/Other  negative endocrine ROS  Renal/GU negative Renal ROS  negative genitourinary   Musculoskeletal negative musculoskeletal ROS (+)   Abdominal   Peds  Hematology negative hematology ROS (+)   Anesthesia Other Findings  Breast cancer  Reproductive/Obstetrics                           Anesthesia Physical Anesthesia Plan  ASA: II  Anesthesia Plan: General   Post-op Pain Management: GA combined w/ Regional for post-op pain   Induction: Intravenous  PONV Risk Score and Plan: 3 and Ondansetron, Dexamethasone, Midazolam and Treatment may vary due to age or medical condition  Airway Management Planned: LMA  Additional Equipment: None  Intra-op Plan:   Post-operative Plan: Extubation in OR  Informed Consent: I have reviewed the patients History and Physical, chart, labs and discussed the procedure including the risks, benefits and alternatives for the proposed anesthesia with the patient or authorized representative who has indicated his/her understanding and acceptance.     Dental advisory given  Plan Discussed with:   Anesthesia Plan Comments:         Anesthesia Quick Evaluation

## 2020-05-20 NOTE — Anesthesia Procedure Notes (Signed)
Procedure Name: LMA Insertion Date/Time: 05/20/2020 12:09 PM Performed by: Lavonia Dana, CRNA Pre-anesthesia Checklist: Patient identified, Emergency Drugs available, Suction available and Patient being monitored Patient Re-evaluated:Patient Re-evaluated prior to induction Oxygen Delivery Method: Circle system utilized Preoxygenation: Pre-oxygenation with 100% oxygen Induction Type: IV induction Ventilation: Mask ventilation without difficulty LMA: LMA inserted LMA Size: 4.0 Number of attempts: 1 Airway Equipment and Method: Bite block Placement Confirmation: positive ETCO2 Tube secured with: Tape Dental Injury: Teeth and Oropharynx as per pre-operative assessment

## 2020-05-20 NOTE — Discharge Instructions (Signed)
[facility] Office Phone Number [phone]  BREAST BIOPSY/ PARTIAL MASTECTOMY: POST OP INSTRUCTIONS  Always review your discharge instruction sheet given to you by the facility where your surgery was performed.  IF YOU HAVE DISABILITY OR FAMILY LEAVE FORMS, YOU MUST BRING THEM TO THE OFFICE FOR PROCESSING.  DO NOT GIVE THEM TO YOUR DOCTOR.  1. A prescription for pain medication may be given to you upon discharge.  Take your pain medication as prescribed, if needed.  If narcotic pain medicine is not needed, then you may take acetaminophen (Tylenol) or ibuprofen (Advil) as needed. 2. Take your usually prescribed medications unless otherwise directed 3. If you need a refill on your pain medication, please contact your pharmacy.  They will contact our office to request authorization.  Prescriptions will not be filled after 5pm or on week-ends. 4. You should eat very light the first 24 hours after surgery, such as soup, crackers, pudding, etc.  Resume your normal diet the day after surgery. 5. Most patients will experience some swelling and bruising in the breast.  Ice packs and a good support bra will help.  Swelling and bruising can take several days to resolve.  6. It is common to experience some constipation if taking pain medication after surgery.  Increasing fluid intake and taking a stool softener will usually help or prevent this problem from occurring.  A mild laxative (Milk of Magnesia or Miralax) should be taken according to package directions if there are no bowel movements after 48 hours. 7. Unless discharge instructions indicate otherwise, you may remove your bandages 24-48 hours after surgery, and you may shower at that time.  You may have steri-strips (small skin tapes) in place directly over the incision.  These strips should be left on the skin for 7-10 days.  If your surgeon used skin glue on the incision, you may shower in 24 hours.  The glue will flake off over the  next 2-3 weeks.  Any sutures or staples will be removed at the office during your follow-up visit. 8. ACTIVITIES:  You may resume regular daily activities (gradually increasing) beginning the next day.  Wearing a good support bra or sports bra minimizes pain and swelling.  You may have sexual intercourse when it is comfortable. a. You may drive when you no longer are taking prescription pain medication, you can comfortably wear a seatbelt, and you can safely maneuver your car and apply brakes. b. RETURN TO WORK:  ______________________________________________________________________________________ 9. You should see your doctor in the office for a follow-up appointment approximately two weeks after your surgery.  Your doctor's nurse will typically make your follow-up appointment when she calls you with your pathology report.  Expect your pathology report 2-3 business days after your surgery.  You may call to check if you do not hear from us after three days. 10. OTHER INSTRUCTIONS:OK TO REMOVE THE BINDER AND SHOWER STARTING TOMORROW 11. ICE PACK, TYLENOL, AND IBUPROFEN ALSO FOR PAIN 12. NO VIGOROUS ACTIVITY FOR ONE WEEK _______________________________________________________________________________________________ _____________________________________________________________________________________________________________________________________ _____________________________________________________________________________________________________________________________________ _____________________________________________________________________________________________________________________________________  WHEN TO CALL YOUR DOCTOR: 1. Fever over 101.0 2. Nausea and/or vomiting. 3. Extreme swelling or bruising. 4. Continued bleeding from incision. 5. Increased pain, redness, or drainage from the incision.  The clinic staff is available to answer your questions during regular business hours.   Please don't hesitate to call and ask to speak to one of the nurses for clinical concerns.  If you have a medical emergency, go to the nearest emergency room or   call 911.  A surgeon from [facility] is always on call at the hospital.  For further questions, please visit [email or web address]     Post Anesthesia Home Care Instructions  Activity: Get plenty of rest for the remainder of the day. A responsible individual must stay with you for 24 hours following the procedure.  For the next 24 hours, DO NOT: -Drive a car -Operate machinery -Drink alcoholic beverages -Take any medication unless instructed by your physician -Make any legal decisions or sign important papers.  Meals: Start with liquid foods such as gelatin or soup. Progress to regular foods as tolerated. Avoid greasy, spicy, heavy foods. If nausea and/or vomiting occur, drink only clear liquids until the nausea and/or vomiting subsides. Call your physician if vomiting continues.  Special Instructions/Symptoms: Your throat may feel dry or sore from the anesthesia or the breathing tube placed in your throat during surgery. If this causes discomfort, gargle with warm salt water. The discomfort should disappear within 24 hours.  If you had a scopolamine patch placed behind your ear for the management of post- operative nausea and/or vomiting:  1. The medication in the patch is effective for 72 hours, after which it should be removed.  Wrap patch in a tissue and discard in the trash. Wash hands thoroughly with soap and water. 2. You may remove the patch earlier than 72 hours if you experience unpleasant side effects which may include dry mouth, dizziness or visual disturbances. 3. Avoid touching the patch. Wash your hands with soap and water after contact with the patch.     

## 2020-05-20 NOTE — Op Note (Addendum)
Treena Cosman 05/20/2020   Pre-op Diagnosis: RIGHT BREAST CANCER     Post-op Diagnosis: same  Procedure(s): RIGHT BREAST LUMPECTOMY  X 2 WITH RADIOACTIVE SEED AND DEEP RIGHT AXILLARY SENTINEL LYMPH NODE BIOPSY INSERTION PORT-A-CATH LEFT SUBCLAVIAN VEIN  Surgeon(s): Coralie Keens, MD  Anesthesia: General  Staff:  Circulator: McDonough-Hughes, Delene Ruffini, RN Radiology Technologist: Vivi Barrack, RT Scrub Person: Lorenza Burton, CST  Estimated Blood Loss: Minimal               Specimens: sent to path  Indications: This is a 40 year old female recently diagnosed with a right breast cancer.  It was ER positive, PR negative, and HER2 positive.  She underwent MRI confirming the mass in the right breast with no other abnormalities.  She did have an architectural distortion on her mammograms have been followed for 2 years so the decision was made to proceed with a radioactive seed guided lumpectomy to remove the breast cancer as well as the other suspicious area and do a deep axillary sentinel lymph node biopsy.  A Port-A-Cath will also be inserted for chemotherapy  Procedure: The patient was brought to operating identifies correct patient.  She is placed upon the operating table general anesthesia was induced.  Her left chest was prepped and draped in usual sterile fashion.  With the patient in the Trendelenburg position was able to anesthetized the skin and clavicle and then cannulate the left subclavian vein easily with the introducer needle.  A guidewire was then passed through the needle into the central venous system under direct fluoroscopy.  I anesthetized skin further and made incision with a scalpel at the site of the wire.  Then made incision with a scalpel and created a pocket for the port.  An 8 [place] Port-A-Cath was then brought onto the field.  I placed an introducer sheath and dilator over the wire and into the central venous system and then remove the guidewire and dilator.   The catheter was placed to the port and both were flushed.  I cut the catheter appropriate length.  I then placed the port into the pocket and then fed the catheter down the peel-away sheath.  The sheath was then peeled away leaving the catheter in central venous system.  Good placement in the vena cava appeared to be achieved under fluoroscopy.  I accessed the port and good flush and return were demonstrated.  I then placed concentrated heparin solution of the Port-A-Cath.  The port was then sewn to the chest wall with 2 separate 3-0 Prolene sutures.  I then closed subcutaneous tissue with interrupted 3-0 Vicryl sutures and closed the skin with a running 4-0 Monocryl. At this point, the patient was reprepped and draped including the right breast.  Using the neoprobe identify the radioactive seed at the site of the palpable cancer as well as a radioactive seed in the lower outer quadrant of the right breast.  I anesthetized skin around the areola with Marcaine and made a circumareolar incision with a scalpel.  With the aid of the neoprobe I then tunneled toward the palpable malignancy in the upper outer quadrant of the right breast.  I then dissected widely with the cautery staying around the palpable malignancy.  Once the lumpectomy specimen was then completely removed I marked all margins with paint.  An x-ray was performed for me that the radioactive seed and previous tissue marker were in the specimen.  It was then sent to pathology for evaluation.  I next used the neoprobe to identify the second radioactive seed in the lower outer quadrant of the right breast.  I dissected down toward this area with electrocautery and then performed a lumpectomy staying around the radioactive seed.  Once the specimen was removed I also marked the margins with paint.  An x-ray was performed from the radioactive seed was in the lumpectomy specimen.  This was likewise sent to pathology for evaluation. I then used the neoprobe  to identify an area of increased uptake in the right axilla.  I anesthetized the skin with Marcaine and then an incision with a scalpel.  I then dissected down into the deep right axillary tissue and was able to identify at least 1 large sentinel lymph node which I excised with the surrounding fat using the cautery.  Once this node was removed there was no further uptake of radioactive isotope.  There were no other large palpable lymph nodes.  Hemostasis was achieved with the cautery.  I then closed the axilla with interrupted 3-0 Vicryl sutures and a running 4-0 Monocryl suture.  I again evaluated the lumpectomy site and hemostasis appeared to be achieved.  I placed surgical clips around the periphery of where the malignancy has been present for marker purposes.  I anesthetized the deep tissue further with Marcaine.  I then closed subcutaneous tissue with interrupted 3-0 Vicryl sutures and closed the skin with running 4-0 Monocryl.  Dermabond was applied to both incisions.  The patient was next placed in a breast binder.  The patient tolerated the procedure well.  All the counts were correct at the end of the procedure.  The patient was then extubated in the operating room and taken in stable addition to the recovery room.            Coralie Keens   Date: 05/20/2020  Time: 1:38 PM

## 2020-05-20 NOTE — Progress Notes (Signed)
Nuc med at bedside for injections on the right breast.  VSS and pt tolerated the procedure well.

## 2020-05-21 ENCOUNTER — Encounter (HOSPITAL_BASED_OUTPATIENT_CLINIC_OR_DEPARTMENT_OTHER): Payer: Self-pay | Admitting: Surgery

## 2020-05-22 LAB — SURGICAL PATHOLOGY

## 2020-05-25 ENCOUNTER — Encounter (HOSPITAL_COMMUNITY): Payer: Self-pay

## 2020-06-02 ENCOUNTER — Ambulatory Visit (HOSPITAL_COMMUNITY)
Admission: RE | Admit: 2020-06-02 | Discharge: 2020-06-02 | Disposition: A | Discharge status: Home / Self Care | Payer: PRIVATE HEALTH INSURANCE | Source: Ambulatory Visit | Attending: Hematology | Admitting: Hematology

## 2020-06-02 ENCOUNTER — Other Ambulatory Visit: Payer: Self-pay

## 2020-06-02 DIAGNOSIS — C50411 Malignant neoplasm of upper-outer quadrant of right female breast: Secondary | ICD-10-CM | POA: Diagnosis present

## 2020-06-02 DIAGNOSIS — Z0189 Encounter for other specified special examinations: Secondary | ICD-10-CM

## 2020-06-02 DIAGNOSIS — Z171 Estrogen receptor negative status [ER-]: Secondary | ICD-10-CM | POA: Insufficient documentation

## 2020-06-02 LAB — ECHOCARDIOGRAM COMPLETE
AR max vel: 2.12 cm2
AV Area VTI: 2.08 cm2
AV Area mean vel: 2.2 cm2
AV Mean grad: 7 mmHg
AV Peak grad: 15.4 mmHg
Ao pk vel: 1.96 m/s
Area-P 1/2: 3.28 cm2
S' Lateral: 2.8 cm

## 2020-06-02 NOTE — Progress Notes (Signed)
*  PRELIMINARY RESULTS* Echocardiogram 2D Echocardiogram has been performed.  Lauren Ortega 06/02/2020, 11:13 AM

## 2020-06-11 ENCOUNTER — Encounter: Payer: Self-pay | Admitting: Rehabilitation

## 2020-06-11 ENCOUNTER — Telehealth: Payer: Self-pay | Admitting: Radiology

## 2020-06-11 ENCOUNTER — Other Ambulatory Visit: Payer: Self-pay

## 2020-06-11 ENCOUNTER — Ambulatory Visit: Payer: PRIVATE HEALTH INSURANCE | Attending: Surgery | Admitting: Rehabilitation

## 2020-06-11 DIAGNOSIS — Z483 Aftercare following surgery for neoplasm: Secondary | ICD-10-CM | POA: Insufficient documentation

## 2020-06-11 NOTE — Patient Instructions (Signed)
            [facility]         [address], [place] [address]         [phone]         Annia Friendly, PT, CLT   After Breast Cancer Class It is recommended you attend the ABC class to be educated on lymphedema risk reduction. This class is free of charge and lasts for 1 hour. It is a 1-time class.    Scar massage    Compression garment    Home exercise Program    Follow up PT: It is recommended you return every 3 months for the first 3 years following surgery to be assessed on the SOZO machine for an L-Dex score. This helps prevent clinically significant lymphedema in 95% of patients. These follow up screens are 15 minute appointments that you are not billed for. APPOINTMENTS FOR THIS AFTER 07/14/2020 WILL BE LOCATED AT [facility] AT [address] [place] [address].

## 2020-06-11 NOTE — Therapy (Signed)
[place], [place], [address] Phone: [phone]   Fax:  [phone]  Physical Therapy Treatment  Patient Details  Name: Lauren Ortega MRN: [id number] Date of Birth: 10-20-1980 Referring Provider (PT): Dr. Ninfa Linden   Encounter Date: 06/11/2020   PT End of Session - 06/11/20 0846    Visit Number 2    Number of Visits 2    Date for PT Re-Evaluation 06/17/20    PT Start Time 0804    PT Stop Time 0834    PT Time Calculation (min) 30 min    Activity Tolerance Patient tolerated treatment well    Behavior During Therapy [facility] for tasks assessed/performed           Past Medical History:  Diagnosis Date  . Anemia   . Anxiety   . Breast cancer ([place]) 03/2020  . Family history of colon cancer   . Family history of lymphoma   . Family history of pancreatic cancer   . Family history of skin cancer   . Hypertension     Past Surgical History:  Procedure Laterality Date  . BREAST LUMPECTOMY WITH RADIOACTIVE SEED AND SENTINEL LYMPH NODE BIOPSY Right 05/20/2020   Procedure: RIGHT BREAST LUMPECTOMY WITH RADIOACTIVE SEED AND SENTINEL LYMPH NODE BIOPSY;  Surgeon: Coralie Keens, MD;  Location: [place];  Service: General;  Laterality: Right;  . PORTACATH PLACEMENT Left 05/20/2020   Procedure: INSERTION PORT-A-CATH;  Surgeon: Coralie Keens, MD;  Location: [place];  Service: General;  Laterality: Left;  . TONSILLECTOMY    . TUBAL LIGATION      There were no vitals filed for this visit.   Subjective Assessment - 06/11/20 0804    Subjective He took 5 nodes all negative.  Doing well.  ROM no issues.  Got the all clear to work out.  Started yoga monday.    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 Ninfa Linden, HTN, DDD lumbar spine,    Currently in Pain? No/denies   only when I touch  the area. Sometimes at night             [facility] PT Assessment - 06/11/20 0001      Observation/Other Assessments   Observations healing nipple and axillary incisions with some hematoma on the anterior breast but improving      AROM   Right Shoulder Flexion 179 Degrees    Right Shoulder ABduction 170 Degrees      Palpation   Palpation comment slight tenderness axilla             LYMPHEDEMA/ONCOLOGY QUESTIONNAIRE - 06/11/20 0001      Lymphedema Assessments   Lymphedema Assessments Upper extremities      Right Upper Extremity Lymphedema   10 cm Proximal to Olecranon Process 27.5 cm    Olecranon Process 23.4 cm    10 cm Proximal to Ulnar Styloid Process 19.5 cm    Just Proximal to Ulnar Styloid Process 14.7 cm    Across Hand at [employer] 18.1 cm    At [place] of 2nd Digit 5.8 cm                      [facility] Adult PT Treatment/Exercise - 06/11/20 0001      Self-Care   Self-Care Other Self-Care Comments    Other Self-Care Comments  Discussed return to  yoga and beach body activities, use of sleeve if needed, lymphedema ABC class information, scar massage, walking and activity through chemo and radiation                  PT Education - 06/11/20 0845    Education Details see treatment section    Person(s) Educated Patient    Methods Explanation;Handout    Comprehension Verbalized understanding               PT Long Term Goals - 06/11/20 0847      PT LONG TERM GOAL #1   Title Pt will return to baseline AROM    Status Achieved      PT LONG TERM GOAL #2   Title Pt will be scheduled for post op SOZO surveillance to detect subclinical lymphedema    Status Achieved                 Plan - 06/11/20 0846    Clinical Impression Statement Pt returns post lumpectom and 5 lymph nodes removed with return to baseline AROM, no signs of swelling in the breast or arm, good knowledge of continued home exercise through beach body, and return to  work without limitations.  Focused on self care and exercise throughout chemotherapy and radiation, use of compression, and scar massage as well as when to return to PT.  Pt will return in August for 3 month SOZO.    PT Next Visit Plan SOZO    Consulted and Agree with Plan of Care Patient           Patient will benefit from skilled therapeutic intervention in order to improve the following deficits and impairments:     Visit Diagnosis: Aftercare following surgery for neoplasm     Problem List Patient Active Problem List   Diagnosis Date Noted  . Genetic testing 05/12/2020  . Family history of pancreatic cancer   . Family history of skin cancer   . Family history of lymphoma   . Family history of colon cancer   . Malignant neoplasm of upper-outer quadrant of right female breast ([place]) 04/16/2020    Stark Bray 06/11/2020, 8:48 AM  [place], [place], [address] Phone: [phone]   Fax:  [phone]  Name: Lauren Ortega MRN: [id number] Date of Birth: 1980-07-14

## 2020-06-11 NOTE — Telephone Encounter (Signed)
[id number] - TREATMENT OF REFRACTORY NAUSEA  06/11/2020    9:15AM  PHONE CALL: Confirmed I was speaking with Lauren Ortega. Introduced myself and informed patient reason for the call was to follow-up about interest in participating in the above mentioned trial and checking in on patient post-surgery. Patient stated she is doing very well after surgery and has been doing physical therapy. That is great to hear and very happy that everything went well! Patient also states that she is very interested in participating. [name], RN, Clinical Research Nurse plans on meeting with patient tomorrow during patients visit with Dr. Delton Coombes. This coordinator will follow-up with [name], RN, Nurse Navigator as well as Dr. Delton Coombes to let them know research is planning a visit. Patient was thanked for her time and interested and we look forward to speaking with her soon.  [name], RT(R)(T) Clinical Research Coordinator

## 2020-06-12 ENCOUNTER — Encounter: Payer: Self-pay | Admitting: *Deleted

## 2020-06-12 ENCOUNTER — Encounter (HOSPITAL_COMMUNITY): Payer: Self-pay | Admitting: Hematology

## 2020-06-12 ENCOUNTER — Inpatient Hospital Stay (HOSPITAL_COMMUNITY): Payer: PRIVATE HEALTH INSURANCE | Attending: Hematology | Admitting: Hematology

## 2020-06-12 VITALS — BP 119/70 | HR 81 | Temp 98.5°F | Resp 18 | Wt 136.6 lb

## 2020-06-12 DIAGNOSIS — Z5112 Encounter for antineoplastic immunotherapy: Secondary | ICD-10-CM | POA: Diagnosis present

## 2020-06-12 DIAGNOSIS — Z170 Estrogen receptor positive status [ER+]: Secondary | ICD-10-CM

## 2020-06-12 DIAGNOSIS — Z79899 Other long term (current) drug therapy: Secondary | ICD-10-CM | POA: Insufficient documentation

## 2020-06-12 DIAGNOSIS — C50411 Malignant neoplasm of upper-outer quadrant of right female breast: Secondary | ICD-10-CM | POA: Diagnosis not present

## 2020-06-12 NOTE — Research (Signed)
Trial Name:  [id number]: Treatment of Refractory Nausea   [name] was identified as a potential candidate for the above listed study.  This Clinical Research Nurse met with Taliya Mcclard, [id number] on 06/12/20 in a manner and location that ensures patient privacy to discuss participation in the above listed research study.  Patient is Accompanied by her mother.  Patient was previously provided with informed consent documents.  Patient has not yet read the informed consent documents and so documents were reviewed page by page today.    As outlined in the informed consent form, this Nurse and Isabella Stalling discussed the purpose of the research study, the investigational nature of the study, study procedures and requirements for study participation, potential risks and benefits of study participation, as well as alternatives to participation.  This study is blinded or double-blinded. The patient understands participation is voluntary and they may withdraw from study participation at any time.  Each study arm was reviewed, and randomization discussed.  Potential side effects were reviewed with patient as outlined in the consent form, and patient made aware there may be side effects not yet known. The chance of receiving placebo was discussed. Patient understands enrollment is pending full eligibility review.   Confidentiality and how the patient's information will be used as part of study participation were discussed.  Patient was informed there is not reimbursement provided for their time and effort spent on trial participation.  The patient is encouraged to discuss research study participation with their insurance provider to determine what costs they may incur as part of study participation, including research related injury.    All questions were answered to patient's satisfaction.  The informed consent and separate HIPAA Authorization was reviewed page by page.  The patient's mental and emotional  status is appropriate to provide informed consent, and the patient verbalizes an understanding of study participation.  Patient has agreed to participate in the above listed research study and has voluntarily signed the informed consent, protocol version 08/10/18, [place] Active Date 05/21/2019 and separate HIPAA Authorization, version date 04/11/16 on 06/12/20 at 10:46 AM.  The patient was provided with a copy of the signed informed consent form and separate HIPAA Authorization for their reference.  No study specific procedures were obtained prior to the signing of the informed consent document.  Approximately 40 minutes were spent with the patient reviewing the informed consent documents. Patient was provided a Release of Information form and this Nurse explained that it may be utilized if the patient receives care at an outside facility during the time of study participation, and explained the form is optional and could also be completed at a later date if needed. Patient has chosen to not complete a Release of Information form at this time.    Patient prefers that she be called at a later date to confirm her medical/surgical history and current medications.  A member of the Research team will call her next week.  Patient was also informed that the trial questionnaires may be completed via paper form or via email if patient prefers.  Patient confirmed she preferred completing via email.  Patient provided her email address: [email or web address].  [name], RN, BSN, [facility] 06/12/2020 2:56 PM

## 2020-06-12 NOTE — Progress Notes (Signed)
Lauren Ortega, [name]   CLINIC:  Medical Oncology/Hematology  PCP:  [name], [address] [place] [address] [phone]   REASON FOR VISIT:  Follow-up for right breast cancer, HER2 positive  PRIOR THERAPY: Right lumpectomy and SLNB on 05/20/2020.  NGS Results: Not applicable  CURRENT THERAPY: [place]  BRIEF ONCOLOGIC HISTORY:  Oncology History  Malignant neoplasm of upper-outer quadrant of right female breast ([place])  04/16/2020 Initial Diagnosis   Malignant neoplasm of upper-outer quadrant of right female breast [facility])    Genetic Testing   Negative genetic testing. No pathogenic variants identified on the [facility] Multi-Cancer Panel+RNA. The report date is 05/10/2020.  The Multi-Cancer Panel + RNA offered by [facility] includes sequencing and/or deletion duplication testing of the following 84 genes: AIP, ALK, APC, ATM, AXIN2,BAP1,  BARD1, BLM, BMPR1A, BRCA1, BRCA2, BRIP1, CASR, CDC73, CDH1, CDK4, CDKN1B, CDKN1C, CDKN2A (p14ARF), CDKN2A (p16INK4a), CEBPA, CHEK2, CTNNA1, DICER1, DIS3L2, EGFR (c.2369C>T, p.Thr790Met variant only), EPCAM (Deletion/duplication testing only), FH, FLCN, GATA2, GPC3, GREM1 (Promoter region deletion/duplication testing only), HOXB13 (c.251G>A, p.Gly84Glu), HRAS, KIT, MAX, MEN1, MET, MITF (c.952G>A, p.Glu318Lys variant only), MLH1, MSH2, MSH3, MSH6, MUTYH, NBN, NF1, NF2, NTHL1, PALB2, PDGFRA, PHOX2B, PMS2, POLD1, POLE, POT1, PRKAR1A, PTCH1, PTEN, RAD50, RAD51C, RAD51D, RB1, RECQL4, RET, RUNX1, SDHAF2, SDHA (sequence changes only), SDHB, SDHC, SDHD, SMAD4, SMARCA4, SMARCB1, SMARCE1, STK11, SUFU, TERC, TERT, TMEM127, TP53, TSC1, TSC2, VHL, WRN and WT1.     CANCER STAGING: Cancer Staging Malignant neoplasm of upper-outer quadrant of right female breast [facility]) Staging form: Breast, AJCC 8th Edition - Clinical stage from 04/21/2020: Stage IA (cT1c, cN0, cM0, G3, ER+, PR-, HER2+) - Unsigned    INTERVAL  HISTORY:  [name] 40 y.o. female seen for follow-up after lumpectomy and sentinel lymph node biopsy.  She underwent right breast lumpectomy on 05/20/2020.  She is recovering well from surgery.  She is accompanied by her mother today.  Appetite and energy levels are rated as 100%.  Mild tenderness at the site of the surgical resection.  She is undergoing physical therapy.    REVIEW OF SYSTEMS:  Review of Systems  All other systems reviewed and are negative.    PAST MEDICAL/SURGICAL HISTORY:  Past Medical History:  Diagnosis Date  . Anemia   . Anxiety   . Breast cancer ([place]) 03/2020  . Family history of colon cancer   . Family history of lymphoma   . Family history of pancreatic cancer   . Family history of skin cancer   . Hypertension    Past Surgical History:  Procedure Laterality Date  . BREAST LUMPECTOMY WITH RADIOACTIVE SEED AND SENTINEL LYMPH NODE BIOPSY Right 05/20/2020   Procedure: RIGHT BREAST LUMPECTOMY WITH RADIOACTIVE SEED AND SENTINEL LYMPH NODE BIOPSY;  Surgeon: Coralie Keens, MD;  Location: [place];  Service: General;  Laterality: Right;  . PORTACATH PLACEMENT Left 05/20/2020   Procedure: INSERTION PORT-A-CATH;  Surgeon: Coralie Keens, MD;  Location: [place];  Service: General;  Laterality: Left;  . TONSILLECTOMY    . TUBAL LIGATION       SOCIAL HISTORY:  Social History   Socioeconomic History  . Marital status: Divorced    Spouse name: Not on file  . Number of children: Not on file  . Years of education: Not on file  . Highest education level: Not on file  Occupational History  . Not on file  Tobacco Use  . Smoking status: Never  Smoker  . Smokeless tobacco: Never Used  Substance and Sexual Activity  . Alcohol use: Yes    Comment: occassionally  . Drug use: Never  . Sexual activity: Yes    Birth control/protection: Surgical  Other Topics Concern  . Not on file  Social History Narrative  . Not on file    Social Determinants of Health   Financial Resource Strain: Low Risk   . Difficulty of Paying Living Expenses: Not hard at all  Food Insecurity: No Food Insecurity  . Worried About Charity fundraiser in the Last Year: Never true  . Ran Out of Food in the Last Year: Never true  Transportation Needs: No Transportation Needs  . Lack of Transportation (Medical): No  . Lack of Transportation (Non-Medical): No  Physical Activity: Insufficiently Active  . Days of Exercise per Week: 2 days  . Minutes of Exercise per Session: 20 min  Stress: No Stress Concern Present  . Feeling of Stress : Not at all  Social Connections: Moderately Integrated  . Frequency of Communication with Friends and Family: More than three times a week  . Frequency of Social Gatherings with Friends and Family: More than three times a week  . Attends Religious Services: 1 to 4 times per year  . Active Member of Clubs or Organizations: No  . Attends Archivist Meetings: 1 to 4 times per year  . Marital Status: Divorced  Human resources officer Violence: Not At Risk  . Fear of Current or Ex-Partner: No  . Emotionally Abused: No  . Physically Abused: No  . Sexually Abused: No    FAMILY HISTORY:  Family History  Problem Relation Age of Onset  . Basal cell carcinoma Mother 37  . Bladder Cancer Maternal Uncle 72  . Lymphoma Maternal Uncle 57       [place]   . Pancreatic cancer Maternal Grandmother   . Non-Hodgkin's lymphoma Maternal Grandfather   . Lung cancer Paternal Grandfather   . Colon cancer Other        x8 or 9  . Colon cancer Maternal Great-grandfather     CURRENT MEDICATIONS:  Outpatient Encounter Medications as of 06/12/2020  Medication Sig  . amLODipine (NORVASC) 10 MG tablet Take 10 mg by mouth daily.  . baclofen (LIORESAL) 20 MG tablet Take 20 mg by mouth 3 (three) times daily as needed.  Marland Kitchen FERREX 150 150 MG capsule Take 150 mg by mouth daily.  . meloxicam (MOBIC) 15 MG tablet Take 15  mg by mouth daily as needed.  Marland Kitchen oxyCODONE (OXY IR/ROXICODONE) 5 MG immediate release tablet Take 1 tablet (5 mg total) by mouth every 6 (six) hours as needed for moderate pain or severe pain.  Marland Kitchen sertraline (ZOLOFT) 50 MG tablet Take 50 mg by mouth daily.  Marland Kitchen spironolactone (ALDACTONE) 50 MG tablet Take 50 mg by mouth every morning.   No facility-administered encounter medications on file as of 06/12/2020.    ALLERGIES:  No Known Allergies   PHYSICAL EXAM:  ECOG Performance status: 0  Vitals:   06/12/20 0951  BP: 119/70  Pulse: 81  Resp: 18  Temp: 98.5 F (36.9 C)  SpO2: 98%   Filed Weights   06/12/20 0951  Weight: 136 lb 9.6 oz (62 kg)   Physical Exam Vitals reviewed. Exam conducted with a chaperone present.  Constitutional:      Appearance: Normal appearance.  Chest:       Comments: Right lumpectomy scar around areola and axillary lymph  node biopsy site are well-healed.::: Neurological:     Mental Status: She is alert and oriented to person, place, and time.  Psychiatric:        Mood and Affect: Mood normal.        Behavior: Behavior normal.      LABORATORY DATA:  I have reviewed the labs as listed.  CBC No results found for: WBC, RBC, HGB, HCT, PLT, MCV, MCH, MCHC, RDW, LYMPHSABS, MONOABS, EOSABS, BASOSABS CMP Latest Ref Rng & Units 05/19/2020  Glucose 70 - 99 mg/dL 84  BUN 6 - 20 mg/dL 12  Creatinine 0.44 - 1.00 mg/dL 0.66  Sodium 135 - 145 mmol/L 135  Potassium 3.5 - 5.1 mmol/L 4.5  Chloride 98 - 111 mmol/L 104  CO2 22 - 32 mmol/L 22  Calcium 8.9 - 10.3 mg/dL 9.5    DIAGNOSTIC IMAGING:  I have independently reviewed the scans and discussed with the patient.  ASSESSMENT: 1.  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  She had mammogram/ultrasound on 03/18/2020 done in [place] which showed a 1.4 x 0.9 x 1.5 cm mass in the upper outer quadrant of the  right breast. -Biopsy on 03/31/2020 consistent with intermediate to high-grade infiltrating ductal carcinoma of the right breast at 10:30 position, HER-2 3+ positive, Ki-67 40%, ER weak staining in less than 10% of tumor cells, PR negative. -MRI of the breast on 04/20/2020 with 1.3 x 1.5 x 1.4 cm irregular enhancing mass within the upper outer right breast.  No abnormal appearing lymph nodes. - Right breast lumpectomy and SLNB on 05/20/2020- grade 3 IDC, 1.8 cm, invasive carcinoma is less than 1 mm from anterior margin focally and less than 1 mm from the posterior margin broadly.  Margins negative for in situ carcinoma.  Lymphovascular invasion present.  0/5 lymph nodes positive.  There is focal ductal carcinoma within the vascular space in the soft tissue adjacent to the lymph node.  No carcinoma is seen within the lymph nodes.  PT1CPN0. - Genetic testing was negative.  2.  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  Never smoker. -Maternal grandmother had colon cancer.  Maternal grandfather died of non-Hodgkin's lymphoma.  Paternal grandfather died of lung cancer.  Maternal uncle had multiple myeloma.  Mother had basal cell skin cancer.   PLAN:  1.  Stage I (T1 cN0 M0) right breast IDC, HER2 positive: - We talked about pathology report in detail. - I have recommended adjuvant chemotherapy with TCH regimen every 3 weeks for 6 cycles, followed by Herceptin every 3 weeks for 1 year. - Even though her ER is weakly positive, she will be placed on antiestrogen therapy during maintenance Herceptin therapy and will be recommended to have radiation therapy after completion of chemo. - We talked about chemotherapy regimen and side effects in detail including but not limited to alopecia, bone marrow suppression, peripheral neuropathy, nail changes, infection risk, GI toxicity.  We also discussed about rare chance of CHF with Herceptin. - She already has  port placed.  We reviewed genetic testing results which was negative. - She is interested in [facility] nausea protocol.  She will be enrolled in the trial. - We will likely proceed with her first treatment next week.  I will see her back in 3 weeks.  2.  High risk drug monitoring: - 2D echo evaluated by me  on 06/02/2020 shows EF 55-60%.  3.  Normocytic anemia: - She reports that her hemoglobin is in the range of 10-11.  We will also check ferritin and iron panel as a baseline.     Orders placed this encounter:  Orders Placed This Encounter  Procedures  . CBC with Differential  . Comprehensive metabolic panel  . Ferritin  . Iron and TIBC  . Magnesium  . Vitamin B12   Total time spent is 40 minutes with more than 50% of the time spent face-to-face discussing and reviewing pathology report, treatment plan, side effects, counseling and coordination of care.   Derek Jack, MD [place] [phone]

## 2020-06-12 NOTE — Progress Notes (Signed)
START ON PATHWAY REGIMEN - Breast     Cycle 1: A cycle is 21 days:     Trastuzumab-xxxx      Docetaxel      Carboplatin    Cycles 2 through 6: A cycle is every 21 days:     Trastuzumab-xxxx      Docetaxel      Carboplatin    Cycles 7 through 17: A cycle is every 21 days:     Trastuzumab-xxxx   **Always confirm dose/schedule in your pharmacy ordering system**  Patient Characteristics: Postoperative without Neoadjuvant Therapy (Pathologic Staging), Invasive Disease, Adjuvant Therapy, HER2 Positive, ER Positive, Node Negative, pT1c, pN0/N47m Therapeutic Status: Postoperative without Neoadjuvant Therapy (Pathologic Staging) AJCC Grade: G3 AJCC N Category: pN0 AJCC M Category: cM0 ER Status: Positive (+) AJCC 8 Stage Grouping: IA HER2 Status: Positive (+) Oncotype Dx Recurrence Score: Not Appropriate AJCC T Category: pT1c PR Status: Negative (-) Adjuvant Therapy Status: No Adjuvant Therapy Received Yet or Changing Initial Adjuvant Regimen due to Tolerance Intent of Therapy: Curative Intent, Discussed with Patient

## 2020-06-12 NOTE — Patient Instructions (Signed)
[place] at [facility] Discharge Instructions  You were seen and examined today by Dr. Delton Coombes. Dr. Delton Coombes reviewed your pathology report from surgery.  Dr. Delton Coombes has recommended a treatment regimen that includes carboplatin, taxotere and herceptin. You will receive this here at the [facility] through the Port-A-Cath. All three drugs are given every 3 weeks for 6 doses. You will then continue Herceptin only every 3 weeks for one year to complete treatment. Dr. Delton Coombes also discussed starting an antiestrogen pill after chemotherapy, this would be for 5 years. Radiation would also follow chemotherapy.  You will return to the clinic for chemotherapy education followed by treatment initiation.   Thank you for choosing [place] at [facility] to provide your oncology and hematology care.  To afford each patient quality time with our provider, please arrive at least 15 minutes before your scheduled appointment time.   If you have a lab appointment with the [facility] please come in thru the Main Entrance and check in at the main information desk.  You need to re-schedule your appointment should you arrive 10 or more minutes late.  We strive to give you quality time with our providers, and arriving late affects you and other patients whose appointments are after yours.  Also, if you no show three or more times for appointments you may be dismissed from the clinic at the providers discretion.     Again, thank you for choosing [facility].  Our hope is that these requests will decrease the amount of time that you wait before being seen by our physicians.       _____________________________________________________________  Should you have questions after your visit to [facility], please contact our office at [phone] and follow the prompts.  Our office hours are 8:00 a.m. and 4:30 p.m. Monday - Friday.   Please note that voicemails left after 4:00 p.m. may not be returned until the following business day.  We are closed weekends and major holidays.  You do have access to a nurse 24-7, just call the main number to the clinic [phone] and do not press any options, hold on the line and a nurse will answer the phone.    For prescription refill requests, have your pharmacy contact our office and allow 72 hours.    Due to Covid, you will need to wear a mask upon entering the hospital. If you do not have a mask, a mask will be given to you at the Main Entrance upon arrival. For doctor visits, patients may have 1 support person age 48 or older with them. For treatment visits, patients can not have anyone with them due to social distancing guidelines and our immunocompromised population.

## 2020-06-16 ENCOUNTER — Other Ambulatory Visit: Payer: Self-pay

## 2020-06-16 ENCOUNTER — Inpatient Hospital Stay (HOSPITAL_COMMUNITY): Payer: PRIVATE HEALTH INSURANCE

## 2020-06-16 ENCOUNTER — Encounter (HOSPITAL_COMMUNITY): Payer: Self-pay

## 2020-06-16 ENCOUNTER — Telehealth: Payer: Self-pay | Admitting: Radiology

## 2020-06-16 ENCOUNTER — Encounter (HOSPITAL_COMMUNITY): Payer: Self-pay | Admitting: Hematology

## 2020-06-16 DIAGNOSIS — C50411 Malignant neoplasm of upper-outer quadrant of right female breast: Secondary | ICD-10-CM

## 2020-06-16 DIAGNOSIS — Z170 Estrogen receptor positive status [ER+]: Secondary | ICD-10-CM

## 2020-06-16 DIAGNOSIS — Z95828 Presence of other vascular implants and grafts: Secondary | ICD-10-CM

## 2020-06-16 HISTORY — DX: Presence of other vascular implants and grafts: Z95.828

## 2020-06-16 MED ORDER — LIDOCAINE-PRILOCAINE 2.5-2.5 % EX CREA: TOPICAL_CREAM | CUTANEOUS | 3 refills | Status: DC

## 2020-06-16 MED ORDER — PROCHLORPERAZINE MALEATE 10 MG PO TABS: 10.0000 mg | ORAL_TABLET | Freq: Four times a day (QID) | ORAL | 1 refills | Status: DC | PRN

## 2020-06-16 NOTE — Progress Notes (Signed)
Chemotherapy/immunotherapy education packet given and discussed with pt in detail.  Discussed diagnosis, staging, tx regimen, and intent of tx.  Reviewed chemotherapy/immunotherapy medications and side effects, as well as pre-medications.  Instructed on how to manage side effects at home, and when to call the clinic.  Importance of fever/chills discussed with pt. Discussed precautions to implement at home after receiving tx, as well as self care strategies. Phone numbers provided for clinic during regular working hours, also how to reach the clinic after hours and on weekends. Pt provided the opportunity to ask questions - all questions answered to pt's satisfaction.    

## 2020-06-16 NOTE — Telephone Encounter (Signed)
[id number] - TREATMENT OF REFRACTORY NAUSEA  06/16/20   10:30AM  PHONE CALL: Confirmed I was speaking with Lauren Ortega. Informed patient reason for call was to review her current medication list. This coordinator reviewed each medication line by line. Patient confirms she is taking Almodipine, Baclofen, Ferrex, and Zoloft as prescribed, but Meloxicam, Spironolactone and Oxycodone only as needed. She is no longer taking Diflucan and has not taken any Klonopin now or previously. Confirmed patient had a full tubal ligation in 2009. After completion of review, this coordinator enrolled patient into the above mentioned study. Informed patient next steps would be to complete e-mailed questionnaires from the study, and complete her first infusion as scheduled for 06/23/20. This coordinator will call Friday 06/26/20 to confirm status of nausea. Patient was thanked for her time and encouraged to call with any further questions or concerns.   [name], RT(R)(T) Clinical Research Coordinator

## 2020-06-16 NOTE — Patient Instructions (Addendum)
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  You will be treated in the clinic every 3 weeks with a combination of chemotherapy and immunotherapy drugs.  Those drugs are docetaxel (Taxotere), carboplatin, and traztuzumab (Herceptin).  After 6 cycles of receiving all three drugs, you will continue to come to the clinic every 3 weeks to receive Herceptin only.  Once you have completed a year's worth of treatment of Herceptin, therapy in our clinic will be complete. The intent of treatment is cure.  You will see the doctor regularly throughout treatment.  We will obtain blood work from you prior to every treatment and monitor your results to make sure it is safe to give your treatment. The doctor monitors your response to treatment by the way you are feeling, your blood work, and by obtaining scans periodically.  There will be wait times while you are here for treatment.  It will take about 30 minutes to 1 hour for your lab work to result.  Then there will be wait times while pharmacy mixes your medications.    Medications you will receive in the clinic prior to your chemotherapy medications:  Medications you will receive in the clinic prior to your chemotherapy medications:  Aloxi:  ALOXI is used in adults to help prevent nausea and vomiting that happens with certain chemotherapy drugs.  Aloxi is a long acting medication, and will remain in your system for about two days.   Emend:  This is an anti-nausea medication that is used with Aloxi to help prevent nausea and vomiting caused by chemotherapy.  Dexamethasone:  This is a steroid given prior to chemotherapy to help prevent allergic reactions; it may also help prevent and control nausea and diarrhea.   Pepcid:  This medication is a histamine blocker that helps prevent and allergic reaction to your chemotherapy.   Benadryl:  This is a histamine blocker (different  from the Pepcid) that helps prevent allergic/infusion reactions to your chemotherapy. This medication may cause dizziness/drowsiness.  Tylenol:  Given prior to immunotherapy infusions to prevent infusion reactions such as fever/chills.    Carboplatin  About This Drug Carboplatin is used to treat cancer. It is given in the vein (IV) through your port a cath.  It will take 30 minutes to infuse.   Possible Side Effects . Bone marrow suppression. This is a decrease in the number of white blood cells, red blood cells, and platelets. This may raise your risk of infection, make you tired and weak (fatigue), and raise your risk of bleeding.  . Nausea and vomiting (throwing up)  . Weakness  . Changes in your liver function  . Changes in your kidney function  . Electrolyte changes  . Pain  . Effects on the nerves are called peripheral neuropathy. You may feel numbness, tingling, or pain in your hands and feet. It may be hard for you to button your clothes, open jars, or walk as usual. The effect on the nerves may get worse with more doses of the drug. These effects get better in some people after the drug is stopped but it does not get better in all people.   Note: Not all possible side effects are included above.  Warnings and Precautions . Severe bone marrow suppression  . Allergic reactions, including anaphylaxis are rare but may happen in some patients. Signs of allergic reaction to this drug may be swelling of the face, feeling  like your tongue or throat are swelling, trouble breathing, rash, itching, fever, chills, feeling dizzy, and/or feeling that your heart is beating in a fast or not normal way. If this happens, do not take another dose of this drug. You should get urgent medical treatment.  . Severe nausea and vomiting  . Peripheral neuropathy - The risk is increased if you are over the age of 46 or if you have received other medicine with risk of peripheral neuropathy.  .  Blurred vision, loss of vision or other changes in eyesight  . Decreased hearing  . Skin and tissue irritation including redness, pain, warmth, or swelling at the IV site if the drug leaks out of the vein and into nearby tissue  . Severe changes in your kidney function, which can cause kidney failure  . Severe changes in your liver function, which can cause liver failure  Note: Some of the side effects above are very rare. If you have concerns and/or questions, please discuss them with your medical team.  Important Information . This drug may be present in the saliva, tears, sweat, urine, stool, vomit, semen, and vaginal secretions. Talk to your doctor and/or your nurse about the necessary precautions to take during this time.  Treating Side Effects . Manage tiredness by pacing your activities for the day.  . Be sure to include periods of rest between energy-draining activities.  . To decrease the risk of infection, wash your hands regularly.  . Avoid close contact with people who have a cold, the flu, or other infections.  . Take your temperature as your doctor or nurse tells you, and whenever you feel like you may have a fever.  . To help decrease the risk of bleeding, use a soft toothbrush. Check with your nurse before using dental floss.   . Be very careful when using knives or tools.  . Use an electric shaver instead of a razor.  . Drink plenty of fluids (a minimum of eight glasses per day is recommended).  . If you throw up or have loose bowel movements, you should drink more fluids so that you do not become dehydrated (lack of water in the body from losing too much fluid).  . To help with nausea and vomiting, eat small, frequent meals instead of three large meals a day. Choose foods and drinks that are at room temperature. Ask your nurse or doctor about other helpful tips and medicine that is available to help stop or lessen these symptoms.  . If you have numbness and  tingling in your hands and feet, be careful when cooking, walking, and handling sharp objects and hot liquids.  Marland Kitchen Keeping your pain under control is important to your well-being. Please tell your doctor or nurse if you are experiencing pain.  Food and Drug Interactions . There are no known interactions of carboplatin with food.  . This drug may interact with other medicines. Tell your doctor and pharmacist about all the prescription and over-the-counter medicines and dietary supplements (vitamins, minerals, herbs and others) that you are taking at this time. Also, check with your doctor or pharmacist before starting any new prescription or over-the-counter medicines, or dietary supplements to make sure that there are no interactions.   When to Call the Doctor Call your doctor or nurse if you have any of these symptoms and/or any new or unusual symptoms:  . Fever of 100.4 F (38 C) or higher  . Chills  . Tiredness that interferes with your  daily activities  . Feeling dizzy or lightheaded  . Easy bleeding or bruising  . Nausea that stops you from eating or drinking and/or is not relieved by prescribed medicines  . Throwing up  . Blurred vision or other changes in eyesight  . Decrease in hearing or ringing in the ear  . Signs of allergic reaction: swelling of the face, feeling like your tongue or throat are swelling, trouble breathing, rash, itching, fever, chills, feeling dizzy, and/or feeling that your heart is beating in a fast or not normal way. If this happens, call 911 for emergency care.  . While you are getting this drug, please tell your nurse right away if you have any pain, redness, or swelling at the site of the IV infusion.  . Signs of possible liver problems: dark urine, pale bowel movements, bad stomach pain, feeling very tired and weak, unusual itching, or yellowing of the eyes or skin  . Decreased urine, or very dark urine  . Numbness, tingling, or pain in your  hands and feet  . Pain that does not go away or is not relieved by prescribed medicine  . If you think you may be pregnant  Reproduction Warnings . Pregnancy warning: This drug may have harmful effects on the unborn baby. Women of childbearing potential should use effective methods of birth control during your cancer treatment. Let your doctor know right away if you think you may be pregnant.  . Breastfeeding warning: It is not known if this drug passes into breast milk. For this reason, women should not breastfeed during treatment because this drug could enter the breast milk and cause harm to a  breastfeeding baby.  . Fertility warning: Human fertility studies have not been done with this drug. Talk with your doctor or nurse if you plan to have children. Ask for information on sperm or egg banking.   Docetaxel (Taxotere)  About This Drug  Docetaxel is used to treat cancer. It is given in the vein (IV) through your port a cath.  It will take 1 hour to infuse. Your first infusion will take longer than 1 hour due to the fact that we start it at a very slow rate and gradually increase the rate until the maximum infusion rate is reached.  This is done in order to monitor you closely for allergic/infusion reactions.  Your nurse will remain in the room with you for the first 15 minutes of this infusion on your first time getting it.  If you tolerate the first infusion without adverse reactions, going forward we will give you this drug at the normal infusion rate over 1 hour.   Possible Side Effects . Bone marrow suppression. This is a decrease in the number of white blood cells, red blood cells, and platelets. This may raise your risk of infection, make you tired and weak (fatigue), and raise your risk of bleeding.  . Fever in the setting of decreased white blood cells, which is a serious condition that can be lifethreatening  . Soreness of the mouth and throat. You may have red areas, white  patches, or sores that hurt.  . Nausea and vomiting (throwing up)  . Constipation (not able to move bowels)  . Diarrhea (loose bowel movements)  . Infections  . Swelling of your legs, ankles and/or feet, or fluid build-up around your lungs, heart or elsewhere   . Changes in the way food and drinks taste  . Effects on the nerves are called peripheral  neuropathy. You may feel numbness, tingling, or pain in your hands and feet. It may be hard for you to button your clothes, open jars, or walk as usual. The effect on the nerves may get worse with more doses of the drug. These effects get better in some people after the drug is stopped but it does not get better in all people.  . Decreased appetite (decreased hunger)  . Weakness  . Pain  . Muscle pain/aching  . Trouble breathing  . Changes in your nail color, you may have nail loss and/or brittle nail   . Hair loss. Hair loss is often temporary, although there have been cases of permanent hair loss reported. Hair loss may happen suddenly or gradually. If you lose hair, you may lose it from your head, face, armpits, pubic area, chest, and/or legs. You may also notice your hair getting thin.  . Allergic skin reaction. You may develop blisters on your skin that are filled with fluid or a severe red rash all over your body that may be painful.  . Allergic reactions, including anaphylaxis are rare but may happen in some patients. Signs of allergic reaction to this drug may be swelling of the face, feeling like your tongue or throat are swelling, trouble breathing, rash, itching, fever, chills, feeling dizzy, and/or feeling that your heart is beating in a fast or not normal way. If this happens, do not take another dose of this drug. You should get urgent medical treatment.  Note: Not all possible side effects are included above.  Warnings and Precautions . Severe bone marrow suppression, including febrile neutropenia - fever in the setting  of decreased white blood cells, which may be life threatening.  . Severe allergic reactions, including anaphylaxis which can be life-threatening  . Swelling (inflammation) in the colon in the setting of severely low white blood cells, which raises your risk of infection and can be life-threatening  . Severe skin reactions, including redness, swelling or peeling of skin  . Severe swelling in the eye or other changes in eyesight  . Severe swelling of your legs, ankles and/or feet. Sometimes, fluid can build up in your lungs and/or around your heart causing you trouble breathing.  . If you have a history of abnormal liver function, receive high doses of docetaxel, or have a history of lung cancer and have received treatment with a platinum (type of chemotherapy medication), you have an increased risk of death.  . Severe weakness  . This drug may raise your risk of getting a second cancer such as leukemia and myelodysplastic syndrome.  . Severe peripheral neuropathy - numbness, tingling, or pain in your hands and feet . This drug contains alcohol and may affect your central nervous system. The central nervous system is made up of your brain and spinal cord. You may feel drunk during and after your treatment and it can impair your ability to drive or use machinery for one to two hours after infusion.   . Tumor lysis syndrome: This drug may act on the cancer cells very quickly. This may affect how your kidneys work.  Note: Some of the side effects above are very rare. If you have concerns and/or questions, please discuss them with your medical team.  Important Information . This drug may be present in the saliva, tears, sweat, urine, stool, vomit, semen, and vaginal secretions. Talk to your doctor and/or your nurse about the necessary precautions to take during this time.  Treating Side Effects .  Manage tiredness by pacing your activities for the day.  . Be sure to include periods of rest  between energy-draining activities.  . Get regular exercise. If you feel too tired to exercise vigorously, try taking a short walk.  . To decrease the risk of infection, wash your hands regularly.  . Avoid close contact with people who have a cold, the flu, or other infections.  . Take your temperature as your doctor or nurse tells you, and whenever you feel like you may have a fever.  . To help decrease the risk of bleeding, use a soft toothbrush. Check with your nurse before using dental floss.  . Be very careful when using knives or tools.  . Use an electric shaver instead of a razor.  . Mouth care is very important and will help food taste better and improve your appetite. Your mouth care should consist of routine, gentle cleaning of your teeth or dentures and rinsing your mouth with a mixture of 1/2 teaspoon of salt in 8 ounces of water or 1/2 teaspoon of baking soda in 8 ounces of water. This should be done at least after each meal and at bedtime.  . If you have mouth sores, avoid mouthwash that has alcohol. Also avoid alcohol and smoking because they can bother your mouth and throat.  . Ask your doctor or nurse about medicines that are available to help stop or lessen constipation and/or diarrhea.  . If you are not able to move your bowels, check with your doctor or nurse before you use enemas, laxatives, or suppositories.  . Drink plenty of fluids (a minimum of eight glasses per day - 64 oz -  is recommended).  . If you throw up or have loose bowel movements, you should drink more fluids so that you do not become dehydrated (lack of water in the body from losing too much fluid).  . If you have diarrhea, eat low-fiber foods that are high in protein and calories and avoid foods that can irritate your digestive tracts or lead to cramping.  . To help with nausea and vomiting, eat small, frequent meals instead of three large meals a day. Choose foods and drinks that are at room  temperature. Ask your nurse or doctor about other helpful tips and medicine that is available to help stop or lessen these symptoms.  . To help with decreased appetite, eat foods high in calories and protein, such as meat, poultry, fish, dry beans, tofu, eggs, nuts, milk, yogurt, cheese, ice cream, pudding, and nutritional supplements.  . Consider using sauces and spices to increase taste. Daily exercise, with your doctor's approval, may increase your appetite.  Marland Kitchen Keeping your pain under control is important to your well-being. Please tell your doctor or nurse if you are experiencing pain.  . If you get a rash do not put anything on it unless your doctor or nurse says you may. Keep the area around the rash clean and dry. Ask your doctor for medicine if your rash bothers you.  Marland Kitchen Keeping your nails moisturized may help with brittleness.  . To help with hair loss, wash with a mild shampoo and avoid washing your hair every day.  . Avoid rubbing your scalp, pat your hair or scalp dry.  . Avoid coloring your hair.  . Limit your use of hair spray, electric curlers, blow dryers, and curling irons.  . If you are interested in getting a wig, talk to your nurse. You can also call  the [employer] at 800-ACS-2345 to find out information about the "Look Good, Feel Better" program close to where you live. It is a free program where women getting chemotherapy can learn about wigs, turbans and scarves as well as makeup techniques and skin and nail care.  . If you have numbness and tingling in your hands and feet, be careful when cooking, walking, and handling sharp objects and hot liquids.  Food and Drug Interactions . There are no known interactions of docetaxel with food.  . This drug may interact with other medicines. Tell your doctor and pharmacist about all the prescription and over-the-counter medicines and dietary supplements (vitamins, minerals, herbs and others) that you are taking at  this time. Also, check with your doctor or pharmacist before starting any new prescription or over-the-counter medicines, or dietary supplements to make sure that there are no interactions.  When to Call the Doctor Call your doctor or nurse if you have any of these symptoms and/or any new or unusual symptoms:  . Fever of 100.4 F (38 C) or higher  . Chills  . Blurred vision or other changes in eyesight  . Easy bruising or bleeding  . Wheezing or trouble breathing  . Chest pain  . Feeling dizzy or lightheaded  . Tiredness that interferes with your daily activities  . Pain in your mouth or throat that makes it hard to eat or drink  . Nausea that stops you from eating or drinking and/or is not relieved by prescribed medicines  . Throwing up  . Lasting loss of appetite or rapid weight loss of five pounds in a week  . Diarrhea, 4 times in one day or diarrhea with lack of strength or a feeling of being dizzy  . No bowel movement in 3 days or when you feel uncomfortable  . Severe abdominal pain that does not go away  . Blood in your stool  . Numbness, tingling, or pain in your hands and feet  . Swelling of legs, ankles, or feet  . Weight gain of 5 pounds in one week (fluid retention)  . Extreme weakness that interferes with normal activities  . New rash and/or itching  . Rash that is not relieved by prescribed medicines  . Signs of inflammation/infection (redness, swelling, pain) of the tissue around your nails.  . Signs of allergic reaction: swelling of the face, feeling like your tongue or throat are swelling, trouble breathing, rash, itching, fever, chills, feeling dizzy, and/or feeling that your heart is beating in a fast or not normal way. If this happens, call 911 for emergency care.  . Flu-like symptoms: fever, headache, muscle and joint aches, and fatigue (low energy, feeling weak)  . Signs of possible liver problems: dark urine, pale bowel movements, bad  stomach pain, feeling very tired and weak, unusual itching, or yellowing of the eyes or skin  . Symptoms of being drunk, confusion, or being very sleepy  . Confusion or agitation, decreased urine, nausea/vomiting, diarrhea, muscle cramping, numbness and/or tingling, seizures  . General pain that does not go away or is not relieved by prescribed medicine  . If you think you may be pregnant or have impregnated your partner  Reproduction Warnings . Pregnancy warning: This drug can have harmful effects on the unborn baby. Women of childbearing potential should use effective methods of birth control during your cancer treatment and for 6 months after treatment. Men with female partners of childbearing potential should use effective methods of birth  control during your cancer treatment and for 3 months after your cancer treatment. Let your doctor know right away if you think you may be pregnant or may have impregnated your partner.  . Breastfeeding warning: Women should not breastfeed during treatment and for 1 week after treatment because this drug could enter the breast milk and cause harm to a breastfeeding baby.   . Fertility warning: In men, this drug may affect your ability to have children in the future. Talk with your doctor or nurse if you plan to have children. Ask for information on sperm banking.   Trastuzumab-xxxx (Herceptin, [place], [place], [place], [place], [place])  About This Drug  Trastuzumab-xxxx is used to treat cancer. It is given in the vein (IV) through your port a cath.  The first infusion will be given over 90 minutes.  The second infusion will be given over 60 minutes.  The third infusion and all those after will be given over 30 minutes.   Possible Side Effects . Bone marrow suppression. This is a decrease in the number of white blood cells, red blood cells, and platelets. This may raise your risk of infection, make you tired and weak (fatigue), and raise your  risk of bleeding.  . Congestive heart failure - your heart has less ability to pump blood properly  . Soreness of the mouth and throat. You may have red areas, white patches, or sores that hurt.  . Nausea  . Diarrhea (loose bowel movements)  . Fever  . Chills  . Tiredness  . Infection  . Inflammation of nasal passages and throat  . Changes in the way food and drinks taste  . Weight loss  . Headache  . Trouble sleeping  . Cough  . Upper respiratory infection  . Rash  Note: Each of the side effects above was reported in 10% or greater of patients treated with trastuzumab-xxxx. Not all possible side effects are included above.  Warnings and Precautions . Changes in the tissue of the heart and heart function. Some changes may happen that can cause your heart to have less ability to pump blood. This drug may also increase your risk of heart attack.   . Serious and life-threatening lung problems such as inflammation (swelling) and scarring of the lungs which makes breathing difficult.  . While you are getting this drug in your vein (IV), you may have a reaction to the drug. Sometimes you may be given medication to stop or lessen these side effects. Your nurse will check you closely for these signs: fever or shaking chills, flushing, facial swelling, feeling dizzy, headache, trouble breathing, rash, itching, chest tightness, or chest pain. These reactions may happen after your infusion. If this happens, call 911 for emergency care.  . Severe decrease in the number of white blood cells, especially when receiving this drug in combination with other chemotherapy. This may raise your risk of infection which may be lifethreatening.  Note: Some of the side effects above are very rare. If you have concerns and/or questions, please discuss them with your medical team.  Important Information . This drug may be present in the saliva, tears, sweat, urine, stool, vomit, semen, and  vaginal secretions. Talk to your doctor and/or your nurse about the necessary precautions to take during this time.  Treating Side Effects . Manage tiredness by pacing your activities for the day.  . Be sure to include periods of rest between energy-draining activities.  . To decrease the risk of infection, wash  your hands regularly.  . Avoid close contact with people who have a cold, the flu, or other infections.  . Take your temperature as your doctor or nurse tells you, and whenever you feel like you may have a fever.  . To help decrease the risk of bleeding, use a soft toothbrush. Check with your nurse before using dental floss.  . Be very careful when using knives or tools.  . Use an electric shaver instead of a razor.  . Drink plenty of fluids (a minimum of eight glasses per day is recommended).  . To help with nausea, eat small, frequent meals instead of three large meals a day. Choose foods and drinks that are at room temperature. Ask your nurse or doctor about other helpful tips and medicine that is available to help stop or lessen these symptoms.  . Mouth care is very important. Your mouth care should consist of routine, gentle cleaning of your teeth or dentures and rinsing your mouth with a mixture of 1/2 teaspoon of salt in 8 ounces of water or 1/2 teaspoon of baking soda in 8 ounces of water. This should be done at least after each meal and at bedtime.  . Taking good care of your mouth may help food taste better and improve your appetite.  . If you have mouth sores, avoid mouthwash that has alcohol. Also avoid alcohol and smoking because they can bother your mouth and throat.  . If you throw up or have loose bowel movements, you should drink more fluids so that you do not become dehydrated (lack of water in the body from losing too much fluid).  . If you have diarrhea, eat low-fiber foods that are high in protein and calories and avoid foods that can irritate your  digestive tracts or lead to cramping.  . Ask your nurse or doctor about medicine that can lessen or stop your diarrhea.  . To help with weight loss, drink fluids that contribute calories (whole milk, juice, soft drinks, sweetened beverages, milkshakes, and nutritional supplements) instead of water.  . Include a source of protein at every meal and snack, such as meat, poultry, fish, dry beans, tofu, eggs, nuts, milk, yogurt, cheese, ice cream, pudding, and nutritional supplements.  . If you get a rash do not put anything on it unless your doctor or nurse says you may. Keep the area around the rash clean and dry. Ask your doctor for medicine if your rash bothers you.  Marland Kitchen Keeping your pain under control is important to your well-being. Please tell your doctor or nurse if you are experiencing pain.  . If you are having trouble sleeping, talk to your nurse or doctor on tips to help you sleep better.  . Infusion reactions may occur after your infusion. If this happens, call 911 for emergency care.  Food and Drug Interactions . There are no known interactions of trastuzumab-xxxx with food.  . This drug may interact with other medicines. Tell your doctor and pharmacist about all the prescription and over-the-counter medicines and dietary supplements (vitamins, minerals, herbs and others) that you are taking at this time. Also, check with your doctor or pharmacist before starting any new prescription or over-the-counter medicines, or dietary supplements to make sure that there are no interactions.  When to Call the Doctor Call your doctor or nurse if you have any of these symptoms and/or any new or unusual symptoms:  . Fever of 100.4 F (38 C) or higher  . Chills  .  Tiredness that interferes with your daily activities  . Trouble falling or staying asleep  . Feeling dizzy or lightheaded  . A headache that does not go away  . Easy bleeding or bruising  . Wheezing or trouble breathing or  dry cough  . Coughing up yellow, green, or bloody mucus  . Feeling that your heart is beating in a fast or not normal way (palpitations)  . Chest pain or symptoms of a heart attack. Most heart attacks involve pain in the center of the chest that lasts more than a few minutes. The pain may go away and come back, or it can be constant. It can feel like pressure, squeezing, fullness, or pain. Sometimes pain is felt in one or both arms, the back, neck, jaw, or stomach. If any of these symptoms last 2 minutes, call 911.  . Pain in your mouth or throat that makes it hard to eat or drink  . Nausea that stops you from eating or drinking and/or is not relieved by prescribed medicines  . Diarrhea, 4 times in one day or diarrhea with lack of strength or a feeling of being dizzy  . Lasting loss of appetite or rapid weight loss of five pounds in a week  . Swelling of arms, hand, legs, and/or feet  . Weight gain of 5 pounds in one week (fluid retention)  . A new rash and/or itching that is not relieved by prescribed medicines  . Signs of infusion reaction: fever or shaking chills, flushing, facial swelling, feeling dizzy, headache, trouble breathing, rash, itching, chest tightness, or chest pain. If this happens call 911 for emergency care.  . If you think you may be pregnant  Reproduction Warnings . Pregnancy warning: This drug can have harmful effects on the unborn baby. Women of childbearing potential should use effective methods of birth control during your cancer treatment and for 7 months after treatment. Let your doctor know right away if you think you may be pregnant during treatment or within 7 months of receiving treatment.  . Breastfeeding warning: It is not known if this drug passes into breast milk. For this reason, women should talk to their doctor about the risks and benefits of breastfeeding during treatment with this drug and for 7 months after treatment because this drug may enter  the breast milk and cause harm to a breastfeeding baby.  . Fertility warning: Human fertility studies have not been done with this drug. Talk with your doctor or nurse if you plan to have children. Ask for information on sperm or egg banking.   SELF CARE ACTIVITIES WHILE RECEIVING CHEMOTHERAPY:  Hydration Increase your fluid intake 48 hours prior to treatment and drink at least 8 to 12 cups (64 ounces) of water/decaffeinated beverages per day after treatment. You can still have your cup of coffee or soda but these beverages do not count as part of your 8 to 12 cups that you need to drink daily. No alcohol intake.  Medications Continue taking your normal prescription medication as prescribed.  If you start any new herbal or new supplements please let us know first to make sure it is safe.  Mouth Care Have teeth cleaned professionally before starting treatment. Keep dentures and partial plates clean. Use soft toothbrush and do not use mouthwashes that contain alcohol. Biotene is a good mouthwash that is available at most pharmacies or may be ordered by calling [phone]. Use warm salt water gargles (1 teaspoon salt per 1 quart warm  water) before and after meals and at bedtime. If you need dental work, please let the doctor know before you go for your appointment so that we can coordinate the best possible time for you in regards to your chemo regimen. You need to also let your dentist know that you are actively taking chemo. We may need to do labs prior to your dental appointment.  Skin Care Always use sunscreen that has not expired and with SPF (Sun Protection Factor) of 50 or higher. Wear hats to protect your head from the sun. Remember to use sunscreen on your hands, ears, face, & feet.  Use good moisturizing lotions such as udder cream, eucerin, or even Vaseline. Some chemotherapies can cause dry skin, color changes in your skin and nails.    . Avoid long, hot showers or baths. . Use  gentle, fragrance-free soaps and laundry detergent. . Use moisturizers, preferably creams or ointments rather than lotions because the thicker consistency is better at preventing skin dehydration. Apply the cream or ointment within 15 minutes of showering. Reapply moisturizer at night, and moisturize your hands every time after you wash them.  Hair Loss (if your doctor says your hair will fall out)  . If your doctor says that your hair is likely to fall out, decide before you begin chemo whether you want to wear a wig. You may want to shop before treatment to match your hair color. . Hats, turbans, and scarves can also camouflage hair loss, although some people prefer to leave their heads uncovered. If you go bare-headed outdoors, be sure to use sunscreen on your scalp. . Cut your hair short. It eases the inconvenience of shedding lots of hair, but it also can reduce the emotional impact of watching your hair fall out. . Don't perm or color your hair during chemotherapy. Those chemical treatments are already damaging to hair and can enhance hair loss. Once your chemo treatments are done and your hair has grown back, it's OK to resume dyeing or perming hair.  With chemotherapy, hair loss is almost always temporary. But when it grows back, it may be a different color or texture. In older adults who still had hair color before chemotherapy, the new growth may be completely gray.  Often, new hair is very fine and soft.  Infection Prevention Please wash your hands for at least 30 seconds using warm soapy water. Handwashing is the #1 way to prevent the spread of germs. Stay away from sick people or people who are getting over a cold. If you develop respiratory systems such as green/yellow mucus production or productive cough or persistent cough let us know and we will see if you need an antibiotic. It is a good idea to keep a pair of gloves on when going into grocery stores/[employer] to decrease your risk of  coming into contact with germs on the carts, etc. Carry alcohol hand gel with you at all times and use it frequently if out in public. If your temperature reaches 100.5 or higher please call the clinic and let us know.  If it is after hours or on the weekend please go to the ER if your temperature is over 100.5.  Please have your own personal thermometer at home to use.    Sex and bodily fluids If you are going to have sex, a condom must be used to protect the person that isn't taking chemotherapy. Chemo can decrease your libido (sex drive). For a few days after chemotherapy, chemotherapy  can be excreted through your bodily fluids.  When using the toilet please close the lid and flush the toilet twice.  Do this for a few day after you have had chemotherapy.   Effects of chemotherapy on your sex life Some changes are simple and won't last long. They won't affect your sex life permanently.  Sometimes you may feel: . too tired . not strong enough to be very active . sick or sore  . not in the mood . anxious or low Your anxiety might not seem related to sex. For example, you may be worried about the cancer and how your treatment is going. Or you may be worried about money, or about how you family are coping with your illness.  These things can cause stress, which can affect your interest in sex. It's important to talk to your partner about how you feel.  Remember - the changes to your sex life don't usually last long. There's usually no medical reason to stop having sex during chemo. The drugs won't have any long term physical effects on your performance or enjoyment of sex. Cancer can't be passed on to your partner during sex  Contraception It's important to use reliable contraception during treatment. Avoid getting pregnant while you or your partner are having chemotherapy. This is because the drugs may harm the baby. Sometimes chemotherapy drugs can leave a man or woman infertile.  This means you  would not be able to have children in the future. You might want to talk to someone about permanent infertility. It can be very difficult to learn that you may no longer be able to have children. Some people find counselling helpful. There might be ways to preserve your fertility, although this is easier for men than for women. You may want to speak to a fertility expert. You can talk about sperm banking or harvesting your eggs. You can also ask about other fertility options, such as donor eggs. If you have or have had breast cancer, your doctor might advise you not to take the contraceptive pill. This is because the hormones in it might affect the cancer. It is not known for sure whether or not chemotherapy drugs can be passed on through semen or secretions from the vagina. Because of this some doctors advise people to use a barrier method if you have sex during treatment. This applies to vaginal, anal or oral sex. Generally, doctors advise a barrier method only for the time you are actually having the treatment and for about a week after your treatment. Advice like this can be worrying, but this does not mean that you have to avoid being intimate with your partner. You can still have close contact with your partner and continue to enjoy sex.  Animals If you have cats or birds we just ask that you not change the litter or change the cage.  Please have someone else do this for you while you are on chemotherapy.   Food Safety During and After Cancer Treatment Food safety is important for people both during and after cancer treatment. Cancer and cancer treatments, such as chemotherapy, radiation therapy, and stem cell/bone marrow transplantation, often weaken the immune system. This makes it harder for your body to protect itself from foodborne illness, also called food poisoning. Foodborne illness is caused by eating food that contains harmful bacteria, parasites, or viruses.  Foods to avoid Some foods have  a higher risk of becoming tainted with bacteria. These include: Marland Kitchen Unwashed fresh  fruit and vegetables, especially leafy vegetables that can hide dirt and other contaminants . Raw sprouts, such as alfalfa sprouts . Raw or undercooked beef, especially ground beef, or other raw or undercooked meat and poultry . Fatty, fried, or spicy foods immediately before or after treatment.  These can sit heavy on your stomach and make you feel nauseous. . Raw or undercooked shellfish, such as oysters. . Sushi and sashimi, which often contain raw fish.  . Unpasteurized beverages, such as unpasteurized fruit juices, raw milk, raw yogurt, or cider . Undercooked eggs, such as soft boiled, over easy, and poached; raw, unpasteurized eggs; or foods made with raw egg, such as homemade raw cookie dough and homemade mayonnaise  Simple steps for food safety  Shop smart. . Do not buy food stored or displayed in an unclean area. . Do not buy bruised or damaged fruits or vegetables. . Do not buy cans that have cracks, dents, or bulges. . Pick up foods that can spoil at the end of your shopping trip and store them in a cooler on the way home.  Prepare and clean up foods carefully. . Rinse all fresh fruits and vegetables under running water, and dry them with a clean towel or paper towel. . Clean the top of cans before opening them. . After preparing food, wash your hands for 20 seconds with hot water and soap. Pay special attention to areas between fingers and under nails. . Clean your utensils and dishes with hot water and soap. Marland Kitchen Disinfect your kitchen and cutting boards using 1 teaspoon of liquid, unscented bleach mixed into 1 quart of water.    Dispose of old food. . Eat canned and packaged food before its expiration date (the "use by" or "best before" date). . Consume refrigerated leftovers within 3 to 4 days. After that time, throw out the food. Even if the food does not smell or look spoiled, it still may be  unsafe. Some bacteria, such as Listeria, can grow even on foods stored in the refrigerator if they are kept for too long.  Take precautions when eating out. . At restaurants, avoid buffets and salad bars where food sits out for a long time and comes in contact with many people. Food can become contaminated when someone with a virus, often a norovirus, or another "bug" handles it. . Put any leftover food in a "to-go" container yourself, rather than having the server do it. And, refrigerate leftovers as soon as you get home. . Choose restaurants that are clean and that are willing to prepare your food as you order it cooked.   AT HOME MEDICATIONS:  Compazine/Prochlorperazine 10mg  tablet. Take 1 tablet every 6 hours as needed for nausea/vomiting. (This can make you sleepy)   EMLA cream. Apply a quarter size amount to port site 1 hour prior to chemo. Do not rub in. Cover with plastic wrap.    Diarrhea Sheet   If you are having loose stools/diarrhea, please purchase Imodium and begin taking as outlined:  At the first sign of poorly formed or loose stools you should begin taking Imodium (loperamide) 2 mg capsules. Take two tablets (4mg ) followed by one tablet (2mg ) every 2 hours - DO NOT EXCEED 8 tablets in 24 hours.  If it is bedtime and you are having loose stools, take 2 tablets at bedtime, then 2 tablets every 4 hours until morning.   Always call the [place] if you are having loose stools/diarrhea that you can't get under control.  Loose stools/diarrhea leads to dehydration (loss of water) in your body.  We have other options of trying to get the loose stools/diarrhea to stop but you must let us know!   Constipation Sheet  Colace - 100 mg capsules - take 2 capsules daily.  If this doesn't help then you can increase to 2 capsules  twice daily.  Please call if the above does not work for you. Do not go more than 2 days without a bowel movement.  It is very important that you do not become constipated.  It will make you feel sick to your stomach (nausea) and can cause abdominal pain and vomiting.  Nausea Sheet   Compazine/Prochlorperazine 10mg  tablet. Take 1 tablet every 6 hours as needed for nausea/vomiting (This can make you drowsy).  If you are having persistent nausea (nausea that does not stop) please call the [facility] and let us know the amount of nausea that you are experiencing.  If you begin to vomit, you need to call the [place] and if it is the weekend and you have vomited more than one time and can't get it to stop-go to the Emergency Room.  Persistent nausea/vomiting can lead to dehydration (loss of fluid in your body) and will make you feel very weak and unwell. Ice chips, sips of clear liquids, foods that are at room temperature, crackers, and toast tend to be better tolerated.   SYMPTOMS TO REPORT AS SOON AS POSSIBLE AFTER TREATMENT:  FEVER GREATER THAN 100.4 F  CHILLS WITH OR WITHOUT FEVER  NAUSEA AND VOMITING THAT IS NOT CONTROLLED WITH YOUR NAUSEA MEDICATION  UNUSUAL SHORTNESS OF BREATH  UNUSUAL BRUISING OR BLEEDING  TENDERNESS IN MOUTH AND THROAT WITH OR WITHOUT   PRESENCE OF ULCERS  URINARY PROBLEMS  BOWEL PROBLEMS  UNUSUAL RASH     Wear comfortable clothing and clothing appropriate for easy access to any Portacath or PICC line. Let us know if there is anything that we can do to make your therapy better!    What to do if you need assistance after hours or on the weekends: CALL [phone].  HOLD on the line, do not hang up.  You will hear multiple messages but at the end you will be connected with a nurse triage line.  They will contact the doctor if necessary.  Most of the time they will be able to assist you.  Do not call the hospital operator.      I have been  informed and understand all of the instructions given to me and have received a copy. I have been instructed to call the clinic [phone] or my family physician as soon as possible for continued medical care, if indicated. I do not have any more questions at this time but understand that I may call the [facility] or the Patient Navigator at [phone] during office hours should I have questions or need assistance in obtaining follow-up care.

## 2020-06-17 ENCOUNTER — Other Ambulatory Visit: Payer: Self-pay | Admitting: *Deleted

## 2020-06-17 DIAGNOSIS — Z170 Estrogen receptor positive status [ER+]: Secondary | ICD-10-CM

## 2020-06-18 ENCOUNTER — Ambulatory Visit (HOSPITAL_COMMUNITY): Payer: PRIVATE HEALTH INSURANCE | Admitting: Hematology

## 2020-06-18 ENCOUNTER — Telehealth: Payer: Self-pay | Admitting: Radiology

## 2020-06-18 NOTE — Telephone Encounter (Signed)
[id number] - TREATMENT OF REFRACTORY NAUSEA  06/18/20     1:20PM  PHONE CALL: Confirmed I was speaking with Lauren Ortega. Informed patient call was to inquire about receipt of questionnaires by the above listed study. Patient she has received them and plans on completing them prior to chemo. Patient was thanked for her time and continued participation in the above mentioned study.   [name], RT(R)(T) Clinical Research Coordinator

## 2020-06-23 ENCOUNTER — Inpatient Hospital Stay (HOSPITAL_COMMUNITY): Payer: PRIVATE HEALTH INSURANCE

## 2020-06-23 ENCOUNTER — Encounter: Payer: Self-pay | Admitting: Radiology

## 2020-06-23 ENCOUNTER — Other Ambulatory Visit: Payer: Self-pay

## 2020-06-23 VITALS — BP 113/71 | HR 82 | Temp 97.0°F | Resp 18

## 2020-06-23 DIAGNOSIS — C50411 Malignant neoplasm of upper-outer quadrant of right female breast: Secondary | ICD-10-CM

## 2020-06-23 DIAGNOSIS — Z95828 Presence of other vascular implants and grafts: Secondary | ICD-10-CM

## 2020-06-23 DIAGNOSIS — Z5112 Encounter for antineoplastic immunotherapy: Secondary | ICD-10-CM | POA: Diagnosis not present

## 2020-06-23 DIAGNOSIS — Z170 Estrogen receptor positive status [ER+]: Secondary | ICD-10-CM

## 2020-06-23 LAB — COMPREHENSIVE METABOLIC PANEL
ALT: 15 U/L (ref 0–44)
AST: 21 U/L (ref 15–41)
Albumin: 4.1 g/dL (ref 3.5–5.0)
Alkaline Phosphatase: 71 U/L (ref 38–126)
Anion gap: 7 (ref 5–15)
BUN: 12 mg/dL (ref 6–20)
CO2: 26 mmol/L (ref 22–32)
Calcium: 9.1 mg/dL (ref 8.9–10.3)
Chloride: 103 mmol/L (ref 98–111)
Creatinine, Ser: 0.6 mg/dL (ref 0.44–1.00)
GFR, Estimated: >60 mL/min (ref >60)
Glucose, Bld: 101 mg/dL — ABNORMAL HIGH (ref 70–99)
Potassium: 3.5 mmol/L (ref 3.5–5.1)
Sodium: 136 mmol/L (ref 135–145)
Total Bilirubin: 0.6 mg/dL (ref 0.3–1.2)
Total Protein: 7.3 g/dL (ref 6.5–8.1)

## 2020-06-23 LAB — CBC WITH DIFFERENTIAL/PLATELET
Abs Immature Granulocytes: 0.05 10*3/uL (ref 0.00–0.07)
Basophils Absolute: 0.1 10*3/uL (ref 0.0–0.1)
Basophils Relative: 1 %
Eosinophils Absolute: 0.3 10*3/uL (ref 0.0–0.5)
Eosinophils Relative: 3 %
HCT: 38.6 % (ref 36.0–46.0)
Hemoglobin: 12.5 g/dL (ref 12.0–15.0)
Immature Granulocytes: 1 %
Lymphocytes Relative: 19 %
Lymphs Abs: 1.6 10*3/uL (ref 0.7–4.0)
MCH: 29.8 pg (ref 26.0–34.0)
MCHC: 32.4 g/dL (ref 30.0–36.0)
MCV: 91.9 fL (ref 80.0–100.0)
Monocytes Absolute: 0.9 10*3/uL (ref 0.1–1.0)
Monocytes Relative: 11 %
Neutro Abs: 5.3 10*3/uL (ref 1.7–7.7)
Neutrophils Relative %: 65 %
Platelets: 368 10*3/uL (ref 150–400)
RBC: 4.2 MIL/uL (ref 3.87–5.11)
RDW: 12.6 % (ref 11.5–15.5)
WBC: 8 10*3/uL (ref 4.0–10.5)
nRBC: 0 % (ref 0.0–0.2)

## 2020-06-23 LAB — IRON AND TIBC
Iron: 46 ug/dL (ref 28–170)
Saturation Ratios: 8 % — ABNORMAL LOW (ref 10.4–31.8)
TIBC: 542 ug/dL — ABNORMAL HIGH (ref 250–450)
UIBC: 496 ug/dL

## 2020-06-23 LAB — VITAMIN B12: Vitamin B-12: 168 pg/mL — ABNORMAL LOW (ref 180–914)

## 2020-06-23 LAB — FERRITIN: Ferritin: 8 ng/mL — ABNORMAL LOW (ref 11–307)

## 2020-06-23 LAB — MAGNESIUM: Magnesium: 1.9 mg/dL (ref 1.7–2.4)

## 2020-06-23 MED ORDER — PALONOSETRON HCL INJECTION 0.25 MG/5ML
0.2500 mg | Freq: Once | INTRAVENOUS | Status: AC
  Administered 2020-06-23: 0.25 mg via INTRAVENOUS
  Filled 2020-06-23: qty 5

## 2020-06-23 MED ORDER — SODIUM CHLORIDE 0.9 % IV SOLN
150.0000 mg | Freq: Once | INTRAVENOUS | Status: AC
  Administered 2020-06-23: 150 mg via INTRAVENOUS
  Filled 2020-06-23: qty 150

## 2020-06-23 MED ORDER — ACETAMINOPHEN 325 MG PO TABS
650.0000 mg | ORAL_TABLET | Freq: Once | ORAL | Status: AC
Start: 2020-06-23 — End: 2020-06-23
  Administered 2020-06-23: 650 mg via ORAL
  Filled 2020-06-23: qty 2

## 2020-06-23 MED ORDER — SODIUM CHLORIDE 0.9% FLUSH
10.0000 mL | INTRAVENOUS | Status: DC | PRN
  Administered 2020-06-23: 10 mL

## 2020-06-23 MED ORDER — HEPARIN SOD (PORK) LOCK FLUSH 100 UNIT/ML IV SOLN
500.0000 [IU] | Freq: Once | INTRAVENOUS | Status: AC | PRN
  Administered 2020-06-23: 500 [IU]

## 2020-06-23 MED ORDER — TRASTUZUMAB-DKST CHEMO 150 MG IV SOLR
8.0000 mg/kg | Freq: Once | INTRAVENOUS | Status: AC
  Administered 2020-06-23: 504 mg via INTRAVENOUS
  Filled 2020-06-23: qty 24

## 2020-06-23 MED ORDER — SODIUM CHLORIDE 0.9 % IV SOLN: Freq: Once | INTRAVENOUS | Status: AC

## 2020-06-23 MED ORDER — SODIUM CHLORIDE 0.9 % IV SOLN
10.0000 mg | Freq: Once | INTRAVENOUS | Status: AC
  Administered 2020-06-23: 10 mg via INTRAVENOUS
  Filled 2020-06-23: qty 10

## 2020-06-23 MED ORDER — SODIUM CHLORIDE 0.9 % IV SOLN
688.2000 mg | Freq: Once | INTRAVENOUS | Status: AC
  Administered 2020-06-23: 690 mg via INTRAVENOUS
  Filled 2020-06-23: qty 69

## 2020-06-23 MED ORDER — SODIUM CHLORIDE 0.9 % IV SOLN
75.0000 mg/m2 | Freq: Once | INTRAVENOUS | Status: AC
  Administered 2020-06-23: 120 mg via INTRAVENOUS
  Filled 2020-06-23: qty 12

## 2020-06-23 MED ORDER — DIPHENHYDRAMINE HCL 25 MG PO CAPS
50.0000 mg | ORAL_CAPSULE | Freq: Once | ORAL | Status: AC
  Administered 2020-06-23: 50 mg via ORAL
  Filled 2020-06-23: qty 2

## 2020-06-23 NOTE — Research (Signed)
[id number] - TREATMENT OF REFRACTORY NAUSEA  06/23/2020     7:45AM  Patient arrives [facility] infusion area [name] for the Cycle 1 of treatment.    FOUR DAY HOME RECORD + QUESTIONNAIRES: Per study protocol, all paper questionnaires were provided to the patient. Informed patient questionnaires would also be emailed to her. Educated patient on the four day home record. Patient expressed understanding and had no further questions at this time.   LABS: Mandatory labs are collected per consent and study protocol: [name] tolerated well without complaint. Labs were collected via port access prior to infusion, at 8:23AM by Barnetta Chapel, RN. Labs were collected at [facility] and transported to [facility] for processing of ambient tubes. Tubes were successfully brought to the lab at [facility] for processing. Timing, handling, and processing were all followed per protocol. Both EDTA tubes were collected and placed on dry ice immediately.   DISPOSITION: Upon completion off all study requirements, patient began the Cycle 1 of her chemo regimen.   The patient was thanked for their time and continued voluntary participation in this study. [name] has been provided direct contact information and is encouraged to contact this Coordinator for any needs or questions.  PLAN: Informed patient, this coordinator would call on Friday 06/26/20 (Day 4) to speak about any nausea or vomiting the patient may experience. Patient expressed understanding.   [name], RT(R)(T) Clinical Research Coordinator

## 2020-06-23 NOTE — Research (Signed)
DCP-001: Use of a Clinical Trial Screening Tool to Address Cancer Health Disparities in the [place] Program ([facility])  06/23/2020     8:15AM  CONSENT: Upon completion of all [place] study activities, the above mentioned study was introduced to patient. Informed patient of the reason for study, voluntary nature, and one time data collection method. Patient was interested in participating. The patient understands participation is voluntary and they may withdraw from study participation at any time.  Confidentiality and how the patient's information will be used as part of study participation were discussed.   All questions were answered to patient's satisfaction.  The informed consent and separate HIPAA Authorization was reviewed page by page. The patient's mental and emotional status is appropriate to provide informed consent, and the patient verbalizes an understanding of study participation.  Patient has agreed to participate in the above listed research study and has voluntarily signed the informed consent dated 07/17/2018  and separate HIPAA Authorization, version 5 revised 01/09/2019 on 06/23/20 date at 8:43 AM.  The patient was provided with a copy of the signed informed consent form and separate HIPAA Authorization for their reference.  No study specific procedures were obtained prior to the signing of the informed consent document.  Approximately 10 minutes were spent with the patient reviewing the informed consent documents.   The DCP-001 Worksheet was then completed by the patient and was checked for completeness. Patient was thanked for her time and continued support. Patient was registered into OPEN and given PID: [id number].  [name], RT(R)(T) Clinical Research Coordinator

## 2020-06-23 NOTE — Progress Notes (Signed)
Ok to proceed with treatment today per MD. Labs reviewed.  Research labs drawn as well.   Treatment given per orders. Patient tolerated it well without problems. Vitals stable and discharged home from clinic ambulatory. Follow up as scheduled.

## 2020-06-23 NOTE — Patient Instructions (Signed)
[facility]  Discharge Instructions: Thank you for choosing [facility] to provide your oncology and hematology care.  If you have a lab appointment with the Cancer Center, please come in thru the Main Entrance and check in at the main information desk.  Wear comfortable clothing and clothing appropriate for easy access to any Portacath or PICC line.   We strive to give you quality time with your provider. You may need to reschedule your appointment if you arrive late (15 or more minutes).  Arriving late affects you and other patients whose appointments are after yours.  Also, if you miss three or more appointments without notifying the office, you may be dismissed from the clinic at the provider's discretion.      For prescription refill requests, have your pharmacy contact our office and allow 72 hours for refills to be completed.    Today you received the following chemotherapy and/or immunotherapy agents    To help prevent nausea and vomiting after your treatment, we encourage you to take your nausea medication as directed.  BELOW ARE SYMPTOMS THAT SHOULD BE REPORTED IMMEDIATELY: . *FEVER GREATER THAN 100.4 F (38 C) OR HIGHER . *CHILLS OR SWEATING . *NAUSEA AND VOMITING THAT IS NOT CONTROLLED WITH YOUR NAUSEA MEDICATION . *UNUSUAL SHORTNESS OF BREATH . *UNUSUAL BRUISING OR BLEEDING . *URINARY PROBLEMS (pain or burning when urinating, or frequent urination) . *BOWEL PROBLEMS (unusual diarrhea, constipation, pain near the anus) . TENDERNESS IN MOUTH AND THROAT WITH OR WITHOUT PRESENCE OF ULCERS (sore throat, sores in mouth, or a toothache) . UNUSUAL RASH, SWELLING OR PAIN  . UNUSUAL VAGINAL DISCHARGE OR ITCHING   Items with * indicate a potential emergency and should be followed up as soon as possible or go to the Emergency Department if any problems should occur.  Please show the CHEMOTHERAPY ALERT CARD or IMMUNOTHERAPY ALERT CARD at check-in to the  Emergency Department and triage nurse.  Should you have questions after your visit or need to cancel or reschedule your appointment, please contact [facility] [phone]  and follow the prompts.  Office hours are 8:00 a.m. to 4:30 p.m. Monday - Friday. Please note that voicemails left after 4:00 p.m. may not be returned until the following business day.  We are closed weekends and major holidays. You have access to a nurse at all times for urgent questions. Please call the main number to the clinic [phone] and follow the prompts.  For any non-urgent questions, you may also contact your provider using MyChart. We now offer e-Visits for anyone 18 and older to request care online for non-urgent symptoms. For details visit [email or web address].   Also download the MyChart app! Go to the app store, search "MyChart", open the app, select [place], and log in with your MyChart username and password.  Due to Covid, a mask is required upon entering the hospital/clinic. If you do not have a mask, one will be given to you upon arrival. For doctor visits, patients may have 1 support person aged 18 or older with them. For treatment visits, patients cannot have anyone with them due to current Covid guidelines and our immunocompromised population.  

## 2020-06-24 ENCOUNTER — Telehealth (HOSPITAL_COMMUNITY): Payer: Self-pay

## 2020-06-24 NOTE — Telephone Encounter (Signed)
24 hour call back - patient stated she is feeling pretty good today. Stated she had one bout of nausea yesterday when leaving hospital. No other instances. Stated she had a small amount of abdominal pain, but it went away. Patient states she is passing gas but will monitor bowel movement. No other complaints voiced. Patient has clinic numbers to call if needed.

## 2020-06-25 ENCOUNTER — Other Ambulatory Visit: Payer: Self-pay

## 2020-06-25 ENCOUNTER — Inpatient Hospital Stay (HOSPITAL_COMMUNITY): Payer: PRIVATE HEALTH INSURANCE | Attending: Hematology

## 2020-06-25 ENCOUNTER — Encounter (HOSPITAL_COMMUNITY): Payer: Self-pay

## 2020-06-25 VITALS — BP 130/69 | HR 72 | Temp 98.9°F | Resp 18

## 2020-06-25 DIAGNOSIS — Z5189 Encounter for other specified aftercare: Secondary | ICD-10-CM | POA: Insufficient documentation

## 2020-06-25 DIAGNOSIS — Z170 Estrogen receptor positive status [ER+]: Secondary | ICD-10-CM | POA: Diagnosis not present

## 2020-06-25 DIAGNOSIS — I82622 Acute embolism and thrombosis of deep veins of left upper extremity: Secondary | ICD-10-CM | POA: Diagnosis not present

## 2020-06-25 DIAGNOSIS — Z5111 Encounter for antineoplastic chemotherapy: Secondary | ICD-10-CM | POA: Diagnosis present

## 2020-06-25 DIAGNOSIS — Z79899 Other long term (current) drug therapy: Secondary | ICD-10-CM | POA: Diagnosis not present

## 2020-06-25 DIAGNOSIS — C50411 Malignant neoplasm of upper-outer quadrant of right female breast: Secondary | ICD-10-CM | POA: Diagnosis present

## 2020-06-25 DIAGNOSIS — D649 Anemia, unspecified: Secondary | ICD-10-CM | POA: Insufficient documentation

## 2020-06-25 DIAGNOSIS — Z5112 Encounter for antineoplastic immunotherapy: Secondary | ICD-10-CM | POA: Diagnosis present

## 2020-06-25 DIAGNOSIS — Z95828 Presence of other vascular implants and grafts: Secondary | ICD-10-CM

## 2020-06-25 MED ORDER — PEGFILGRASTIM-BMEZ 6 MG/0.6ML ~~LOC~~ SOSY
6.0000 mg | PREFILLED_SYRINGE | Freq: Once | SUBCUTANEOUS | Status: AC
  Administered 2020-06-25: 6 mg via SUBCUTANEOUS
  Filled 2020-06-25: qty 0.6

## 2020-06-25 NOTE — Progress Notes (Signed)
Patient tolerated Ziextenzo injection with no complaints voiced. Site clean and dry with no bruising or swelling noted at site. See MAR for details. Band aid applied.  Patient stable during and after injection. VSS with discharge and left in satisfactory condition with no s/s of distress noted.  

## 2020-06-25 NOTE — Patient Instructions (Signed)
[facility]  Discharge Instructions: Thank you for choosing [facility] to provide your oncology and hematology care.  If you have a lab appointment with the Cancer Center, please come in thru the Main Entrance and check in at the main information desk.  Wear comfortable clothing and clothing appropriate for easy access to any Portacath or PICC line.   We strive to give you quality time with your provider. You may need to reschedule your appointment if you arrive late (15 or more minutes).  Arriving late affects you and other patients whose appointments are after yours.  Also, if you miss three or more appointments without notifying the office, you may be dismissed from the clinic at the provider's discretion.      For prescription refill requests, have your pharmacy contact our office and allow 72 hours for refills to be completed.    Today you received the following chemotherapy and/or immunotherapy agents Ziextenzo      To help prevent nausea and vomiting after your treatment, we encourage you to take your nausea medication as directed.  BELOW ARE SYMPTOMS THAT SHOULD BE REPORTED IMMEDIATELY: *FEVER GREATER THAN 100.4 F (38 C) OR HIGHER *CHILLS OR SWEATING *NAUSEA AND VOMITING THAT IS NOT CONTROLLED WITH YOUR NAUSEA MEDICATION *UNUSUAL SHORTNESS OF BREATH *UNUSUAL BRUISING OR BLEEDING *URINARY PROBLEMS (pain or burning when urinating, or frequent urination) *BOWEL PROBLEMS (unusual diarrhea, constipation, pain near the anus) TENDERNESS IN MOUTH AND THROAT WITH OR WITHOUT PRESENCE OF ULCERS (sore throat, sores in mouth, or a toothache) UNUSUAL RASH, SWELLING OR PAIN  UNUSUAL VAGINAL DISCHARGE OR ITCHING   Items with * indicate a potential emergency and should be followed up as soon as possible or go to the Emergency Department if any problems should occur.  Please show the CHEMOTHERAPY ALERT CARD or IMMUNOTHERAPY ALERT CARD at check-in to the Emergency  Department and triage nurse.  Should you have questions after your visit or need to cancel or reschedule your appointment, please contact [facility] [phone]  and follow the prompts.  Office hours are 8:00 a.m. to 4:30 p.m. Monday - Friday. Please note that voicemails left after 4:00 p.m. may not be returned until the following business day.  We are closed weekends and major holidays. You have access to a nurse at all times for urgent questions. Please call the main number to the clinic [phone] and follow the prompts.  For any non-urgent questions, you may also contact your provider using MyChart. We now offer e-Visits for anyone 18 and older to request care online for non-urgent symptoms. For details visit [email or web address].   Also download the MyChart app! Go to the app store, search "MyChart", open the app, select [place], and log in with your MyChart username and password.  Due to Covid, a mask is required upon entering the hospital/clinic. If you do not have a mask, one will be given to you upon arrival. For doctor visits, patients may have 1 support person aged 18 or older with them. For treatment visits, patients cannot have anyone with them due to current Covid guidelines and our immunocompromised population.  

## 2020-06-26 ENCOUNTER — Telehealth: Payer: Self-pay | Admitting: Radiology

## 2020-06-26 NOTE — Telephone Encounter (Addendum)
[id number] - TREATMENT OF REFRACTORY NAUSEA  06/26/2020   10:18AM  PHONE CALL: Confirmed I was speaking with Lauren Ortega. Informed patient reason for call was to check on nausea status on day 4 (Friday 06/26/2020) of chemo. Patient states she has done very well and had a very mild (1) amount of nausea while leaving the hospital, but this nausea has not returned. Patient states she did not have any vomiting. Reminded patient of questionnaires to complete via e-mail and requested patient to return four-day home record via mail. Patient expressed understanding and was thanked for her time.   10:40AM: Called patient in response to query. Confirmed response to baseline question that was missed.   [name], RT(R)(T) Clinical Research Coordinator

## 2020-06-28 ENCOUNTER — Encounter (HOSPITAL_COMMUNITY): Payer: Self-pay

## 2020-06-29 ENCOUNTER — Encounter (HOSPITAL_COMMUNITY): Payer: Self-pay

## 2020-06-29 ENCOUNTER — Other Ambulatory Visit (HOSPITAL_COMMUNITY): Payer: Self-pay

## 2020-06-29 MED ORDER — BACLOFEN 5 MG PO TABS: 5.0000 mg | ORAL_TABLET | Freq: Three times a day (TID) | ORAL | 0 refills | Status: DC | PRN

## 2020-06-29 NOTE — Progress Notes (Signed)
MyChart message from patient reporting constant abdominal cramping, see patient message for details. Dr. Lindi Adie made aware of patient's reports and has prescribed Baclofen 5mg  TID PRN and advised that it could be imodium related. Patient advised.

## 2020-07-02 ENCOUNTER — Encounter (HOSPITAL_COMMUNITY): Payer: Self-pay | Admitting: *Deleted

## 2020-07-02 ENCOUNTER — Other Ambulatory Visit (HOSPITAL_COMMUNITY): Payer: Self-pay

## 2020-07-02 DIAGNOSIS — Z170 Estrogen receptor positive status [ER+]: Secondary | ICD-10-CM

## 2020-07-02 DIAGNOSIS — Z95828 Presence of other vascular implants and grafts: Secondary | ICD-10-CM

## 2020-07-02 NOTE — Progress Notes (Signed)
Call received from patient stating that she has a worsening bruise on her Port-A-Cath site with no bruising, discomfort or swelling at the site. Patient also reports LUE swelling and abnormal sensation, stating that it just "feels funny." Dr. Delton Coombes made aware and LUE [place] ordered. Patient to be scheduled for [place] ASAP.

## 2020-07-06 ENCOUNTER — Ambulatory Visit (HOSPITAL_COMMUNITY)
Admission: RE | Admit: 2020-07-06 | Discharge: 2020-07-06 | Disposition: A | Discharge status: Home / Self Care | Payer: PRIVATE HEALTH INSURANCE | Source: Ambulatory Visit | Attending: Hematology | Admitting: Hematology

## 2020-07-06 ENCOUNTER — Other Ambulatory Visit: Payer: Self-pay

## 2020-07-06 ENCOUNTER — Inpatient Hospital Stay (HOSPITAL_BASED_OUTPATIENT_CLINIC_OR_DEPARTMENT_OTHER): Payer: PRIVATE HEALTH INSURANCE | Admitting: Hematology

## 2020-07-06 ENCOUNTER — Other Ambulatory Visit (HOSPITAL_COMMUNITY): Payer: PRIVATE HEALTH INSURANCE

## 2020-07-06 ENCOUNTER — Other Ambulatory Visit (HOSPITAL_COMMUNITY): Payer: Self-pay

## 2020-07-06 ENCOUNTER — Inpatient Hospital Stay (HOSPITAL_COMMUNITY): Payer: PRIVATE HEALTH INSURANCE

## 2020-07-06 VITALS — BP 137/77 | HR 76 | Temp 96.8°F | Resp 18 | Wt 136.1 lb

## 2020-07-06 DIAGNOSIS — Z170 Estrogen receptor positive status [ER+]: Secondary | ICD-10-CM

## 2020-07-06 DIAGNOSIS — I82409 Acute embolism and thrombosis of unspecified deep veins of unspecified lower extremity: Secondary | ICD-10-CM

## 2020-07-06 DIAGNOSIS — Z95828 Presence of other vascular implants and grafts: Secondary | ICD-10-CM | POA: Insufficient documentation

## 2020-07-06 DIAGNOSIS — C50411 Malignant neoplasm of upper-outer quadrant of right female breast: Secondary | ICD-10-CM | POA: Insufficient documentation

## 2020-07-06 HISTORY — DX: Acute embolism and thrombosis of unspecified deep veins of unspecified lower extremity: I82.409

## 2020-07-06 MED ORDER — ENOXAPARIN SODIUM 100 MG/ML IJ SOSY
90.0000 mg | PREFILLED_SYRINGE | Freq: Once | INTRAMUSCULAR | Status: AC
  Administered 2020-07-06: 90 mg via SUBCUTANEOUS
  Filled 2020-07-06: qty 0.9

## 2020-07-06 MED ORDER — ELIQUIS 5 MG PO TABS: 5.0000 mg | ORAL_TABLET | Freq: Two times a day (BID) | ORAL | 3 refills | Status: DC

## 2020-07-06 MED ORDER — APIXABAN (ELIQUIS) VTE STARTER PACK (10MG AND 5MG): ORAL_TABLET | ORAL | 0 refills | Status: DC

## 2020-07-06 MED ORDER — ENOXAPARIN SODIUM 100 MG/ML IJ SOSY: 90.0000 mg | PREFILLED_SYRINGE | Freq: Once | INTRAMUSCULAR | Status: DC

## 2020-07-06 NOTE — Patient Instructions (Addendum)
[place] at [facility] Discharge Instructions  You were seen today by Dr. Delton Coombes. He went over your recent results. You have been prescribed Eliquis for your blood clots. Dr. Delton Coombes will see you back in for your next appointment for labs and follow up.   Thank you for choosing [place] at [facility] to provide your oncology and hematology care.  To afford each patient quality time with our provider, please arrive at least 15 minutes before your scheduled appointment time.   If you have a lab appointment with the [facility] please come in thru the Main Entrance and check in at the main information desk  You need to re-schedule your appointment should you arrive 10 or more minutes late.  We strive to give you quality time with our providers, and arriving late affects you and other patients whose appointments are after yours.  Also, if you no show three or more times for appointments you may be dismissed from the clinic at the providers discretion.     Again, thank you for choosing [facility].  Our hope is that these requests will decrease the amount of time that you wait before being seen by our physicians.       _____________________________________________________________  Should you have questions after your visit to [facility], please contact our office at [phone] between the hours of 8:00 a.m. and 4:30 p.m.  Voicemails left after 4:00 p.m. will not be returned until the following business day.  For prescription refill requests, have your pharmacy contact our office and allow 72 hours.    Cancer Center Support Programs:   > Cancer Support Group  2nd Tuesday of the month 1pm-2pm, Journey Room

## 2020-07-06 NOTE — Progress Notes (Signed)
Patient tolerated Lovenox 90 mg injection with no complaints voiced.  Site clean and dry with no bruising or swelling noted.  No complaints of pain.  Discharged with vital signs stable and no signs or symptoms of distress noted.

## 2020-07-06 NOTE — Progress Notes (Signed)
Lauren Ortega, Lauren Ortega   Patient Care Team: Lauren Floras, FNP as PCP - General (Family Medicine) Lauren Mates, RN as Oncology Nurse Navigator (Oncology)  SUMMARY OF ONCOLOGIC HISTORY: Oncology History  Malignant neoplasm of upper-outer quadrant of right female breast ([place])  04/16/2020 Initial Diagnosis   Malignant neoplasm of upper-outer quadrant of right female breast [facility])    Genetic Testing   Negative genetic testing. No pathogenic variants identified on the [facility] Multi-Cancer Panel+RNA. The report date is 05/10/2020.  The Multi-Cancer Panel + RNA offered by [facility] includes sequencing and/or deletion duplication testing of the following 84 genes: AIP, ALK, APC, ATM, AXIN2,BAP1,  BARD1, BLM, BMPR1A, BRCA1, BRCA2, BRIP1, CASR, CDC73, CDH1, CDK4, CDKN1B, CDKN1C, CDKN2A (p14ARF), CDKN2A (p16INK4a), CEBPA, CHEK2, CTNNA1, DICER1, DIS3L2, EGFR (c.2369C>T, p.Thr790Met variant only), EPCAM (Deletion/duplication testing only), FH, FLCN, GATA2, GPC3, GREM1 (Promoter region deletion/duplication testing only), HOXB13 (c.251G>A, p.Gly84Glu), HRAS, KIT, MAX, MEN1, MET, MITF (c.952G>A, p.Glu318Lys variant only), MLH1, MSH2, MSH3, MSH6, MUTYH, NBN, NF1, NF2, NTHL1, PALB2, PDGFRA, PHOX2B, PMS2, POLD1, POLE, POT1, PRKAR1A, PTCH1, PTEN, RAD50, RAD51C, RAD51D, RB1, RECQL4, RET, RUNX1, SDHAF2, SDHA (sequence changes only), SDHB, SDHC, SDHD, SMAD4, SMARCA4, SMARCB1, SMARCE1, STK11, SUFU, TERC, TERT, TMEM127, TP53, TSC1, TSC2, VHL, WRN and WT1.   06/23/2020 -  Chemotherapy    Patient is on Treatment Plan: BREAST DOCETAXEL + CARBOPLATIN + TRASTUZUMAB ([place]) Q21D / TRASTUZUMAB Q21D         CHIEF COMPLIANT: follow-up for right breast cancer, HER2 positive   INTERVAL HISTORY: [name] is a 40 y.o. female here today for follow up of her right breast cancer and recent DVT of left arm. Her last visit was on 05/20/2020.   Today she reports feeling okay.  She reports following chemo on Friday she had body aches, diarrhea, stomach cramps and fever. She reported to urgent care and was diagnosed with influenza. Starting Tuesday she had swelling on her left hand and arm and there is bruising around her port. She has not had diarrhea since yesterday, and she has been treating it with Imodium which has helped. She is no longer having cramps, fever, or body aches, and she denies any nausea or SOB. Her energy levels have improved since the influenza has resolved. She is a Production designer, theatre/television/film which largely requires sitting down. She denies history of blood clots.   REVIEW OF SYSTEMS:   Review of Systems  Constitutional:  Positive for appetite change (50%) and fatigue (75%). Negative for fever.  Respiratory:  Negative for shortness of breath.   Gastrointestinal:  Positive for diarrhea. Negative for nausea.  Musculoskeletal:  Positive for arthralgias (left shoulder 3/10).  All other systems reviewed and are negative.  I have reviewed the past medical history, past surgical history, social history and family history with the patient and they are unchanged from previous note.   ALLERGIES:   has No Known Allergies.   MEDICATIONS:  Current Outpatient Medications  Medication Sig Dispense Refill   Baclofen 5 MG TABS Take 5 mg by mouth 3 (three) times daily as needed. 30 tablet 0   amLODipine (NORVASC) 10 MG tablet Take 10 mg by mouth daily.     CARBOPLATIN IV Inject into the vein every 21 ( twenty-one) days.     clonazePAM (KLONOPIN) 0.25 MG disintegrating tablet Take 0.25 mg by mouth 2 (two) times daily.     DOCETAXEL IV Inject into the vein every 21 ( twenty-one) days.  FERREX 150 150 MG capsule Take 150 mg by mouth daily.     fluconazole (DIFLUCAN) 150 MG tablet Take 150 mg by mouth every 3 (three) days.     lidocaine-prilocaine (EMLA) cream Apply a small amount to port a cath site and cover with plastic wrap 1 hour prior to infusion  appointments 30 g 3   meloxicam (MOBIC) 15 MG tablet Take 15 mg by mouth daily as needed.     oxyCODONE (OXY IR/ROXICODONE) 5 MG immediate release tablet Take 1 tablet (5 mg total) by mouth every 6 (six) hours as needed for moderate pain or severe pain. 25 tablet 0   prochlorperazine (COMPAZINE) 10 MG tablet Take 1 tablet (10 mg total) by mouth every 6 (six) hours as needed (Nausea or vomiting). 30 tablet 1   sertraline (ZOLOFT) 50 MG tablet Take 50 mg by mouth daily.     spironolactone (ALDACTONE) 50 MG tablet Take 50 mg by mouth every morning.     trastuzumab in sodium chloride 0.9 % 250 mL Inject into the vein every 21 ( twenty-one) days.     No current facility-administered medications for this visit.     PHYSICAL EXAMINATION: Performance status (ECOG): 0 - Asymptomatic  Vitals:   07/06/20 1237  BP: 137/77  Pulse: 76  Resp: 18  Temp: (!) 96.8 F (36 C)  SpO2: 100%   Wt Readings from Last 3 Encounters:  07/06/20 136 lb 1.6 oz (61.7 kg)  06/23/20 133 lb 9.6 oz (60.6 kg)  06/12/20 136 lb 9.6 oz (62 kg)   Physical Exam Vitals reviewed.  Constitutional:      Appearance: Normal appearance.  Cardiovascular:     Rate and Rhythm: Normal rate and regular rhythm.     Pulses: Normal pulses.     Heart sounds: Normal heart sounds.  Pulmonary:     Effort: Pulmonary effort is normal.     Breath sounds: Normal breath sounds.  Neurological:     General: No focal deficit present.     Mental Status: She is alert and oriented to person, place, and time.  Psychiatric:        Mood and Affect: Mood normal.        Behavior: Behavior normal.    Breast Exam Chaperone: Lauren Ortega     LABORATORY DATA:  I have reviewed the data as listed CMP Latest Ref Rng & Units 06/23/2020 05/19/2020  Glucose 70 - 99 mg/dL 101(H) 84  BUN 6 - 20 mg/dL 12 12  Creatinine 0.44 - 1.00 mg/dL 0.60 0.66  Sodium 135 - 145 mmol/L 136 135  Potassium 3.5 - 5.1 mmol/L 3.5 4.5  Chloride 98 - 111 mmol/L 103 104   CO2 22 - 32 mmol/L 26 22  Calcium 8.9 - 10.3 mg/dL 9.1 9.5  Total Protein 6.5 - 8.1 g/dL 7.3 -  Total Bilirubin 0.3 - 1.2 mg/dL 0.6 -  Alkaline Phos 38 - 126 U/L 71 -  AST 15 - 41 U/L 21 -  ALT 0 - 44 U/L 15 -   No results found for: ALP379 Lab Results  Component Value Date   WBC 8.0 06/23/2020   HGB 12.5 06/23/2020   HCT 38.6 06/23/2020   MCV 91.9 06/23/2020   PLT 368 06/23/2020   NEUTROABS 5.3 06/23/2020    ASSESSMENT:  1.  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  She had mammogram/ultrasound  on 03/18/2020 done in [place] which showed a 1.4 x 0.9 x 1.5 cm mass in the upper outer quadrant of the right breast. -Biopsy on 03/31/2020 consistent with intermediate to high-grade infiltrating ductal carcinoma of the right breast at 10:30 position, HER-2 3+ positive, Ki-67 40%, ER weak staining in less than 10% of tumor cells, PR negative. -MRI of the breast on 04/20/2020 with 1.3 x 1.5 x 1.4 cm irregular enhancing mass within the upper outer right breast.  No abnormal appearing lymph nodes. - Right breast lumpectomy and SLNB on 05/20/2020- grade 3 IDC, 1.8 cm, invasive carcinoma is less than 1 mm from anterior margin focally and less than 1 mm from the posterior margin broadly.  Margins negative for in situ carcinoma.  Lymphovascular invasion present.  0/5 lymph nodes positive.  There is focal ductal carcinoma within the vascular space in the soft tissue adjacent to the lymph node.  No carcinoma is seen within the lymph nodes.  PT1CPN0. - Genetic testing was negative.   2.  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  Never smoker. -Maternal grandmother had colon cancer.  Maternal grandfather died of non-Hodgkin's lymphoma.  Paternal grandfather died of lung cancer.  Maternal uncle had multiple myeloma.  Mother had basal cell skin  cancer.   PLAN:  1.  Stage I (T1 cN0 M0) right breast IDC, HER2 positive: - She received cycle 1 of [place] on 06/23/2020. - She was diagnosed with flu on 06/26/2020 with body aches and fatigue. - She also had on and off diarrhea and had stomach cramps when she did not have diarrhea.  She took baclofen twice which helped.  Denied any nausea or vomiting. - She will come back next week for cycle 2.   2.  High risk drug monitoring: - 2D echo on 06/02/2020 with EF 55-60%.   3.  Normocytic anemia: - Her hemoglobin on 06/23/2020 was 12.5.  However ferritin was low at 8.  B12 was also mildly low at 168.  We will talk to her about iron supplements and B12 supplements.  4.  Left upper extremity DVT: - She developed worsening left arm swelling since 06/30/2020. -Reviewed results of the left upper extremity Doppler on 07/06/2020 which was positive for acute DVT within the left axillary and peripheral subclavian vein and superficial thrombosis involving left basilic vein. - Recommend anticoagulation throughout the duration of port presence. - We will give her Lovenox 1.5 mg/kg dose today. - We have sent a prescription for Eliquis with loading dose which she will start tomorrow morning.  Orders placed this encounter:  No orders of the defined types were placed in this encounter.   The patient has a good understanding of the overall plan. She agrees with it. She will call with any problems that may develop before the next visit here.  Derek Jack, MD [place] [phone]   I, Lauren Ortega, am acting as a scribe for Dr. Derek Jack.  I, Derek Jack MD, have reviewed the above documentation for accuracy and completeness, and I agree with the above.

## 2020-07-07 ENCOUNTER — Ambulatory Visit (HOSPITAL_COMMUNITY): Payer: PRIVATE HEALTH INSURANCE

## 2020-07-13 ENCOUNTER — Telehealth: Payer: Self-pay | Admitting: Radiology

## 2020-07-13 NOTE — Telephone Encounter (Signed)
[id number] - TREATMENT OF REFRACTORY NAUSEA  07/13/2020     4:30PM  PHONE CALL: Confirmed I was speaking with Lauren Ortega. Informed patient reason for call was to confirm if she had sent 4-day home record from the above mentioned study. Patient confirmed she did send documents last week. Thanked patient for her participation in the above mentioned study and thanked patient for returning documents.   [name], RT(R)(T) Clinical Research Coordinator

## 2020-07-14 ENCOUNTER — Inpatient Hospital Stay (HOSPITAL_COMMUNITY): Payer: PRIVATE HEALTH INSURANCE | Admitting: General Practice

## 2020-07-14 ENCOUNTER — Inpatient Hospital Stay (HOSPITAL_COMMUNITY): Payer: PRIVATE HEALTH INSURANCE

## 2020-07-14 ENCOUNTER — Other Ambulatory Visit: Payer: Self-pay

## 2020-07-14 ENCOUNTER — Inpatient Hospital Stay (HOSPITAL_BASED_OUTPATIENT_CLINIC_OR_DEPARTMENT_OTHER): Payer: PRIVATE HEALTH INSURANCE | Admitting: Hematology and Oncology

## 2020-07-14 VITALS — BP 130/77 | HR 84 | Temp 96.8°F | Resp 18 | Wt 135.4 lb

## 2020-07-14 VITALS — BP 114/69 | HR 88 | Temp 98.0°F | Resp 18

## 2020-07-14 DIAGNOSIS — C50411 Malignant neoplasm of upper-outer quadrant of right female breast: Secondary | ICD-10-CM

## 2020-07-14 DIAGNOSIS — Z5112 Encounter for antineoplastic immunotherapy: Secondary | ICD-10-CM | POA: Diagnosis not present

## 2020-07-14 DIAGNOSIS — Z170 Estrogen receptor positive status [ER+]: Secondary | ICD-10-CM

## 2020-07-14 DIAGNOSIS — Z171 Estrogen receptor negative status [ER-]: Secondary | ICD-10-CM | POA: Diagnosis not present

## 2020-07-14 DIAGNOSIS — Z95828 Presence of other vascular implants and grafts: Secondary | ICD-10-CM

## 2020-07-14 LAB — CBC WITH DIFFERENTIAL/PLATELET
Abs Immature Granulocytes: 0.08 10*3/uL — ABNORMAL HIGH (ref 0.00–0.07)
Basophils Absolute: 0.1 10*3/uL (ref 0.0–0.1)
Basophils Relative: 1 %
Eosinophils Absolute: 0.1 10*3/uL (ref 0.0–0.5)
Eosinophils Relative: 1 %
HCT: 28.4 % — ABNORMAL LOW (ref 36.0–46.0)
Hemoglobin: 9.3 g/dL — ABNORMAL LOW (ref 12.0–15.0)
Immature Granulocytes: 1 %
Lymphocytes Relative: 10 %
Lymphs Abs: 0.8 10*3/uL (ref 0.7–4.0)
MCH: 30.5 pg (ref 26.0–34.0)
MCHC: 32.7 g/dL (ref 30.0–36.0)
MCV: 93.1 fL (ref 80.0–100.0)
Monocytes Absolute: 1.3 10*3/uL — ABNORMAL HIGH (ref 0.1–1.0)
Monocytes Relative: 15 %
Neutro Abs: 6.2 10*3/uL (ref 1.7–7.7)
Neutrophils Relative %: 72 %
Platelets: 441 10*3/uL — ABNORMAL HIGH (ref 150–400)
RBC: 3.05 MIL/uL — ABNORMAL LOW (ref 3.87–5.11)
RDW: 14.2 % (ref 11.5–15.5)
WBC: 8.5 10*3/uL (ref 4.0–10.5)
nRBC: 0 % (ref 0.0–0.2)

## 2020-07-14 LAB — COMPREHENSIVE METABOLIC PANEL
ALT: 25 U/L (ref 0–44)
AST: 24 U/L (ref 15–41)
Albumin: 3.7 g/dL (ref 3.5–5.0)
Alkaline Phosphatase: 83 U/L (ref 38–126)
Anion gap: 6 (ref 5–15)
BUN: 14 mg/dL (ref 6–20)
CO2: 25 mmol/L (ref 22–32)
Calcium: 8.7 mg/dL — ABNORMAL LOW (ref 8.9–10.3)
Chloride: 105 mmol/L (ref 98–111)
Creatinine, Ser: 0.72 mg/dL (ref 0.44–1.00)
GFR, Estimated: >60 mL/min (ref >60)
Glucose, Bld: 100 mg/dL — ABNORMAL HIGH (ref 70–99)
Potassium: 3.8 mmol/L (ref 3.5–5.1)
Sodium: 136 mmol/L (ref 135–145)
Total Bilirubin: 0.5 mg/dL (ref 0.3–1.2)
Total Protein: 6.5 g/dL (ref 6.5–8.1)

## 2020-07-14 LAB — MAGNESIUM: Magnesium: 1.7 mg/dL (ref 1.7–2.4)

## 2020-07-14 MED ORDER — SODIUM CHLORIDE 0.9 % IV SOLN
10.0000 mg | Freq: Once | INTRAVENOUS | Status: AC
  Administered 2020-07-14: 10 mg via INTRAVENOUS
  Filled 2020-07-14: qty 10

## 2020-07-14 MED ORDER — SODIUM CHLORIDE 0.9 % IV SOLN
75.0000 mg/m2 | Freq: Once | INTRAVENOUS | Status: AC
  Administered 2020-07-14: 120 mg via INTRAVENOUS
  Filled 2020-07-14: qty 12

## 2020-07-14 MED ORDER — VITAMIN B-12 1000 MCG PO TABS: 1000.0000 ug | ORAL_TABLET | Freq: Every day | ORAL | 1 refills | Status: DC

## 2020-07-14 MED ORDER — DIPHENHYDRAMINE HCL 25 MG PO CAPS
ORAL_CAPSULE | ORAL | Status: AC
  Filled 2020-07-14: qty 2

## 2020-07-14 MED ORDER — SODIUM CHLORIDE 0.9% FLUSH
10.0000 mL | INTRAVENOUS | Status: DC | PRN
  Administered 2020-07-14: 10 mL

## 2020-07-14 MED ORDER — ACETAMINOPHEN 325 MG PO TABS
ORAL_TABLET | ORAL | Status: AC
  Filled 2020-07-14: qty 2

## 2020-07-14 MED ORDER — SODIUM CHLORIDE 0.9 % IV SOLN
688.2000 mg | Freq: Once | INTRAVENOUS | Status: AC
  Administered 2020-07-14: 690 mg via INTRAVENOUS
  Filled 2020-07-14: qty 69

## 2020-07-14 MED ORDER — DIPHENHYDRAMINE HCL 25 MG PO CAPS
50.0000 mg | ORAL_CAPSULE | Freq: Once | ORAL | Status: AC
  Administered 2020-07-14: 50 mg via ORAL

## 2020-07-14 MED ORDER — PALONOSETRON HCL INJECTION 0.25 MG/5ML
INTRAVENOUS | Status: AC
  Filled 2020-07-14: qty 5

## 2020-07-14 MED ORDER — SODIUM CHLORIDE 0.9 % IV SOLN
150.0000 mg | Freq: Once | INTRAVENOUS | Status: AC
  Administered 2020-07-14: 150 mg via INTRAVENOUS
  Filled 2020-07-14: qty 150

## 2020-07-14 MED ORDER — ACETAMINOPHEN 325 MG PO TABS
650.0000 mg | ORAL_TABLET | Freq: Once | ORAL | Status: AC
Start: 2020-07-14 — End: 2020-07-14
  Administered 2020-07-14: 650 mg via ORAL

## 2020-07-14 MED ORDER — PALONOSETRON HCL INJECTION 0.25 MG/5ML
0.2500 mg | Freq: Once | INTRAVENOUS | Status: AC
  Administered 2020-07-14: 0.25 mg via INTRAVENOUS
  Filled 2020-07-14: qty 5

## 2020-07-14 MED ORDER — HEPARIN SOD (PORK) LOCK FLUSH 100 UNIT/ML IV SOLN
500.0000 [IU] | Freq: Once | INTRAVENOUS | Status: AC | PRN
  Administered 2020-07-14: 500 [IU]

## 2020-07-14 MED ORDER — TRASTUZUMAB-DKST CHEMO 150 MG IV SOLR
6.0000 mg/kg | Freq: Once | INTRAVENOUS | Status: AC
  Administered 2020-07-14: 378 mg via INTRAVENOUS
  Filled 2020-07-14: qty 18

## 2020-07-14 MED ORDER — SODIUM CHLORIDE 0.9 % IV SOLN: Freq: Once | INTRAVENOUS | Status: AC

## 2020-07-14 NOTE — Progress Notes (Signed)
[facility] CSW Progress Notes  Check in visit with patient in infusion.  Overall, she is doing well, has a great support system and positive outlook.  Is able to process any "bumps in the road" with optimism and resilience.  Her children are coping well with her diagnosis and finding positive ways to support her and/or engage in their own productive activities.  She is looking forward to an outdoor music festival in Sept 2022 and is striving to remain well in order to attend this event.  Will check in with her during her 4th infusion.  Edwyna Shell, LCSW Clinical Social Worker Phone:  [phone]

## 2020-07-14 NOTE — Patient Instructions (Signed)
[facility]  Discharge Instructions: Thank you for choosing [facility] to provide your oncology and hematology care.  If you have a lab appointment with the [facility], please come in thru the Main Entrance and check in at the main information desk.  Wear comfortable clothing and clothing appropriate for easy access to any Portacath or PICC line.   We strive to give you quality time with your provider. You may need to reschedule your appointment if you arrive late (15 or more minutes).  Arriving late affects you and other patients whose appointments are after yours.  Also, if you miss three or more appointments without notifying the office, you may be dismissed from the clinic at the provider's discretion.      For prescription refill requests, have your pharmacy contact our office and allow 72 hours for refills to be completed.    Today you received the following chemotherapy and/or immunotherapy agents OGIVRI/Doxetaxel/Carboplatin      To help prevent nausea and vomiting after your treatment, we encourage you to take your nausea medication as directed.  BELOW ARE SYMPTOMS THAT SHOULD BE REPORTED IMMEDIATELY: *FEVER GREATER THAN 100.4 F (38 C) OR HIGHER *CHILLS OR SWEATING *NAUSEA AND VOMITING THAT IS NOT CONTROLLED WITH YOUR NAUSEA MEDICATION *UNUSUAL SHORTNESS OF BREATH *UNUSUAL BRUISING OR BLEEDING *URINARY PROBLEMS (pain or burning when urinating, or frequent urination) *BOWEL PROBLEMS (unusual diarrhea, constipation, pain near the anus) TENDERNESS IN MOUTH AND THROAT WITH OR WITHOUT PRESENCE OF ULCERS (sore throat, sores in mouth, or a toothache) UNUSUAL RASH, SWELLING OR PAIN  UNUSUAL VAGINAL DISCHARGE OR ITCHING   Items with * indicate a potential emergency and should be followed up as soon as possible or go to the Emergency Department if any problems should occur.  Please show the CHEMOTHERAPY ALERT CARD or IMMUNOTHERAPY ALERT CARD at check-in to  the Emergency Department and triage nurse.  Should you have questions after your visit or need to cancel or reschedule your appointment, please contact [facility] [phone]  and follow the prompts.  Office hours are 8:00 a.m. to 4:30 p.m. Monday - Friday. Please note that voicemails left after 4:00 p.m. may not be returned until the following business day.  We are closed weekends and major holidays. You have access to a nurse at all times for urgent questions. Please call the main number to the clinic [phone] and follow the prompts.  For any non-urgent questions, you may also contact your provider using MyChart. We now offer e-Visits for anyone 42 and older to request care online for non-urgent symptoms. For details visit [email or web address].   Also download the MyChart app! Go to the app store, search "MyChart", open the app, select [facility], and log in with your MyChart username and password.  Due to Covid, a mask is required upon entering the hospital/clinic. If you do not have a mask, one will be given to you upon arrival. For doctor visits, patients may have 1 support person aged 42 or older or older with them. For treatment visits, patients cannot have anyone with them due to current Covid guidelines and our immunocompromised population.

## 2020-07-14 NOTE — Progress Notes (Signed)
Lauren Ortega [address] [place] [address]   Patient Care Team: Lauren Floras, FNP as PCP - General (Family Medicine) Lauren Mates, RN as Oncology Nurse Navigator (Oncology)  SUMMARY OF ONCOLOGIC HISTORY: Oncology History  Malignant neoplasm of upper-outer quadrant of right female breast ([facility])  04/16/2020 Initial Diagnosis   Malignant neoplasm of upper-outer quadrant of right female breast [facility])    Genetic Testing   Negative genetic testing. No pathogenic variants identified on the [facility] Multi-Cancer Panel+RNA. The report date is 05/10/2020.  The Multi-Cancer Panel + RNA offered by [facility] includes sequencing and/or deletion duplication testing of the following 84 genes: AIP, ALK, APC, ATM, AXIN2,BAP1,  BARD1, BLM, BMPR1A, BRCA1, BRCA2, BRIP1, CASR, CDC73, CDH1, CDK4, CDKN1B, CDKN1C, CDKN2A (p14ARF), CDKN2A (p16INK4a), CEBPA, CHEK2, CTNNA1, DICER1, DIS3L2, EGFR (c.2369C>T, p.Thr790Met variant only), EPCAM (Deletion/duplication testing only), FH, FLCN, GATA2, GPC3, GREM1 (Promoter region deletion/duplication testing only), HOXB13 (c.251G>A, p.Gly84Glu), HRAS, KIT, MAX, MEN1, MET, MITF (c.952G>A, p.Glu318Lys variant only), MLH1, MSH2, MSH3, MSH6, MUTYH, NBN, NF1, NF2, NTHL1, PALB2, PDGFRA, PHOX2B, PMS2, POLD1, POLE, POT1, PRKAR1A, PTCH1, PTEN, RAD50, RAD51C, RAD51D, RB1, RECQL4, RET, RUNX1, SDHAF2, SDHA (sequence changes only), SDHB, SDHC, SDHD, SMAD4, SMARCA4, SMARCB1, SMARCE1, STK11, SUFU, TERC, TERT, TMEM127, TP53, TSC1, TSC2, VHL, WRN and WT1.   06/23/2020 -  Chemotherapy    Patient is on Treatment Plan: BREAST DOCETAXEL + CARBOPLATIN + TRASTUZUMAB ([place]) Q21D / TRASTUZUMAB Q21D         CHIEF COMPLIANT: follow-up for right breast cancer, HER2 positive   INTERVAL HISTORY: [name] is a 40 y.o. female here today for follow up of her right breast cancer and recent DVT of left arm. Her last visit was on 07/06/2020.   On exam today Lauren Ortega reports  that she has recovered from her flu and that she is no longer having any residual symptoms.  She notes that she predominantly had muscle aches and fever both of which have resolved.  She reports that the Eliquis has been very helpful in improving the tightness and bulging veins of her left upper extremity.  She has however had a markedly heavy.  Which is currently in process.  She started taking the Eliquis on Monday and her menstrual cycle started on Tuesday.  She typically has heavy menstrual cycles but this 1 is heavier than usual.  She does report taking daily iron pills 325 mg p.o. daily.  She otherwise denies any fevers, chills, sweats, nausea, vomiting or diarrhea.  She is willing and able to proceed with chemotherapy treatment today.  REVIEW OF SYSTEMS:   Review of Systems  Constitutional:  Positive for appetite change (50%). Negative for fever. Fatigue: 75%. Respiratory:  Negative for shortness of breath.   Gastrointestinal:  Negative for nausea.  Musculoskeletal:  Arthralgias: left shoulder 3/10.  All other systems reviewed and are negative.  I have reviewed the past medical history, past surgical history, social history and family history with the patient and they are unchanged from previous note.   ALLERGIES:   has No Known Allergies.   MEDICATIONS:  Current Outpatient Medications  Medication Sig Dispense Refill   amLODipine (NORVASC) 10 MG tablet Take 10 mg by mouth daily.     apixaban (ELIQUIS) 5 MG TABS tablet Take 1 tablet (5 mg total) by mouth 2 (two) times daily. 60 tablet 3   APIXABAN (ELIQUIS) VTE STARTER PACK (10MG AND 5MG) Take as directed on package: start with two-70m tablets twice daily for  7 days. On day 8, switch to one-95m tablet twice daily. 1 each 0   Baclofen 5 MG TABS Take 5 mg by mouth 3 (three) times daily as needed. 30 tablet 0   CARBOPLATIN IV Inject into the vein every 21 ( twenty-one) days.     DOCETAXEL IV Inject into the vein every 21 ( twenty-one)  days.     FERREX 150 150 MG capsule Take 150 mg by mouth daily.     folic acid (FOLVITE) 1 MG tablet Take by mouth.     lidocaine-prilocaine (EMLA) cream Apply a small amount to port a cath site and cover with plastic wrap 1 hour prior to infusion appointments 30 g 3   meloxicam (MOBIC) 15 MG tablet Take 15 mg by mouth daily as needed.     oxyCODONE (OXY IR/ROXICODONE) 5 MG immediate release tablet Take 1 tablet (5 mg total) by mouth every 6 (six) hours as needed for moderate pain or severe pain. 25 tablet 0   prochlorperazine (COMPAZINE) 10 MG tablet Take 1 tablet (10 mg total) by mouth every 6 (six) hours as needed (Nausea or vomiting). 30 tablet 1   sertraline (ZOLOFT) 50 MG tablet Take 50 mg by mouth daily.     spironolactone (ALDACTONE) 50 MG tablet Take 50 mg by mouth every morning.     trastuzumab in sodium chloride 0.9 % 250 mL Inject into the vein every 21 ( twenty-one) days.     vitamin B-12 (CYANOCOBALAMIN) 1000 MCG tablet Take 1 tablet (1,000 mcg total) by mouth daily. 90 tablet 1   No current facility-administered medications for this visit.   Facility-Administered Medications Ordered in Other Visits  Medication Dose Route Frequency Provider Last Rate Last Admin   0.9 %  sodium chloride infusion   Intravenous Once Lauren Ortega       acetaminophen (TYLENOL) tablet 650 mg  650 mg Oral Once Lauren Ortega       CARBOplatin (PARAPLATIN) 690 mg in sodium chloride 0.9 % 250 mL chemo infusion  690 mg Intravenous Once Lauren Ortega       dexamethasone (DECADRON) 10 mg in sodium chloride 0.9 % 50 mL IVPB  10 mg Intravenous Once Lauren Ortega       diphenhydrAMINE (BENADRYL) capsule 50 mg  50 mg Oral Once Lauren Ortega       DOCEtaxel (TAXOTERE) 120 mg in sodium chloride 0.9 % 250 mL chemo infusion  75 mg/m2 (Treatment Plan Recorded) Intravenous Once Lauren Ortega       fosaprepitant (EMEND) 150 mg in sodium chloride 0.9 % 145 mL IVPB  150 mg  Intravenous Once DLedell PeoplesIV, Ortega       heparin lock flush 100 unit/mL  500 Units Intracatheter Once PRN Lauren Ortega       palonosetron (ALOXI) injection 0.25 mg  0.25 mg Intravenous Once DLedell PeoplesIV, Ortega       sodium chloride flush (NS) 0.9 % injection 10 mL  10 mL Intracatheter PRN Lauren Ortega       trastuzumab-dkst (OGIVRI) 378 mg in sodium chloride 0.9 % 250 mL chemo infusion  6 mg/kg (Treatment Plan Recorded) Intravenous Once Lauren Ortega         PHYSICAL EXAMINATION: Performance status (ECOG): 0 - Asymptomatic  Vitals:   07/14/20 0825  BP: 130/77  Pulse: 84  Resp: 18  Temp: (!) 96.8 F (36 C)  SpO2: 100%   Wt Readings from Last 3 Encounters:  07/14/20 135 lb 6.4 oz (61.4 kg)  07/06/20 136 lb 1.6 oz (61.7 kg)  06/23/20 133 lb 9.6 oz (60.6 kg)   Physical Exam Vitals reviewed.  Constitutional:      Appearance: Normal appearance.  Cardiovascular:     Rate and Rhythm: Normal rate and regular rhythm.     Pulses: Normal pulses.     Heart sounds: Normal heart sounds.  Pulmonary:     Effort: Pulmonary effort is normal.     Breath sounds: Normal breath sounds.  Neurological:     General: No focal deficit present.     Mental Status: She is alert and oriented to person, place, and time.  Psychiatric:        Mood and Affect: Mood normal.        Behavior: Behavior normal.    Breast Exam Chaperone: Thana Ates     LABORATORY DATA:  I have reviewed the data as listed CMP Latest Ref Rng & Units 07/14/2020 06/23/2020 05/19/2020  Glucose 70 - 99 mg/dL 100(H) 101(H) 84  BUN 6 - 20 mg/dL _0 Creatinine 0.44 - 1.00 mg/dL 0.72 0.60 0.66  Sodium 135 - 145 mmol/L 136 136 135  Potassium 3.5 - 5.1 mmol/L 3.8 3.5 4.5  Chloride 98 - 111 mmol/L 105 103 104  CO2 22 - 32 mmol/L _1 Calcium 8.9 - 10.3 mg/dL 8.7(L) 9.1 9.5  Total Protein 6.5 - 8.1 g/dL 6.5 7.3 -  Total Bilirubin 0.3 - 1.2 mg/dL 0.5 0.6 -  Alkaline Phos 38 - 126 U/L 83  71 -  AST 15 - 41 U/L 24 21 -  ALT 0 - 44 U/L 25 15 -   No results found for: MKL491 Lab Results  Component Value Date   WBC 8.5 07/14/2020   HGB 9.3 (L) 07/14/2020   HCT 28.4 (L) 07/14/2020   MCV 93.1 07/14/2020   PLT 441 (H) 07/14/2020   NEUTROABS 6.2 07/14/2020    ASSESSMENT:  1.  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  She had mammogram/ultrasound on 03/18/2020 done in [place] which showed a 1.4 x 0.9 x 1.5 cm mass in the upper outer quadrant of the right breast. -Biopsy on 03/31/2020 consistent with intermediate to high-grade infiltrating ductal carcinoma of the right breast at 10:30 position, HER-2 3+ positive, Ki-67 40%, ER weak staining in less than 10% of tumor cells, PR negative. -MRI of the breast on 04/20/2020 with 1.3 x 1.5 x 1.4 cm irregular enhancing mass within the upper outer right breast.  No abnormal appearing lymph nodes. - Right breast lumpectomy and SLNB on 05/20/2020- grade 3 IDC, 1.8 cm, invasive carcinoma is less than 1 mm from anterior margin focally and less than 1 mm from the posterior margin broadly.  Margins negative for in situ carcinoma.  Lymphovascular invasion present.  0/5 lymph nodes positive.  There is focal ductal carcinoma within the vascular space in the soft tissue adjacent to the lymph node.  No carcinoma is seen within the lymph nodes.  PT1CPN0. - Genetic testing was negative.   2.  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  Never smoker. -Maternal grandmother had colon cancer.  Maternal grandfather died of  non-Hodgkin's lymphoma.  Paternal grandfather died of lung cancer.  Maternal uncle had multiple myeloma.  Mother had basal cell skin cancer.   PLAN:  1.  Stage I (T1 cN0 M0) right breast IDC, HER2 positive: - She received cycle 1 of [place] on 06/23/2020. - She was diagnosed with flu on  06/26/2020 with body aches and fatigue. These symptoms have resolved.  --today is Cycle 2 Day 1 - She will come back in 3 weeks for cycle 3.   2.  High risk drug monitoring: - 2D echo on 06/02/2020 with EF 55-60%.   3.  Normocytic anemia: - Hgb today is 9.3 in setting of pre-existing iron deficiency and heavy menstrual bleeding --no other overt signs of bleeding. --patient is currently taking PO iron supplementation --will prescribe vitamin b12 1054mg PO daily today --if anemia does not improve consider IV iron therapy  4.  Left upper extremity DVT: - She developed worsening left arm swelling since 06/30/2020. -Reviewed results of the left upper extremity Doppler on 07/06/2020 which was positive for acute DVT within the left axillary and peripheral subclavian vein and superficial thrombosis involving left basilic vein. - Recommend anticoagulation throughout the duration of port presence. - patient is currently taking eliquis as prescribed --heavy menstrual cycle in progress, worsened by eliquis.   Orders placed this encounter:  No orders of the defined types were placed in this encounter.   The patient has a good understanding of the overall plan. She agrees with it. She will call with any problems that may develop before the next visit here.  JLedell Peoples Ortega Department of Hematology/Oncology [facility]ugarc[facility]at W[facility] HospitalPhone: [phone]: [phone]: [email or web address]

## 2020-07-14 NOTE — Progress Notes (Signed)
Patient presents today for OGIVRI/Taxotere/Carboplatin per providers orders.  Vital signs and labs within parameters for treatment.  Patient has no new complaints since last treatment.  Message received from Loma Messing MD, patient okay for treatment.  OGIVRI/Taxotere/Carboplatin infusions given today per MD orders.  Tolerated infusion without adverse affects.  Vital signs stable.  No complaints at this time.  Discharge from clinic ambulatory in stable condition.  Alert and oriented X 3.  Follow up with [facility] as scheduled.

## 2020-07-15 ENCOUNTER — Encounter (HOSPITAL_COMMUNITY): Payer: Self-pay | Admitting: Hematology

## 2020-07-16 ENCOUNTER — Inpatient Hospital Stay (HOSPITAL_COMMUNITY): Payer: PRIVATE HEALTH INSURANCE

## 2020-07-16 ENCOUNTER — Other Ambulatory Visit: Payer: Self-pay

## 2020-07-16 VITALS — BP 119/65 | HR 83 | Temp 98.6°F | Resp 18

## 2020-07-16 DIAGNOSIS — C50411 Malignant neoplasm of upper-outer quadrant of right female breast: Secondary | ICD-10-CM

## 2020-07-16 DIAGNOSIS — Z95828 Presence of other vascular implants and grafts: Secondary | ICD-10-CM

## 2020-07-16 DIAGNOSIS — Z5112 Encounter for antineoplastic immunotherapy: Secondary | ICD-10-CM | POA: Diagnosis not present

## 2020-07-16 DIAGNOSIS — Z170 Estrogen receptor positive status [ER+]: Secondary | ICD-10-CM

## 2020-07-16 MED ORDER — PEGFILGRASTIM-BMEZ 6 MG/0.6ML ~~LOC~~ SOSY
6.0000 mg | PREFILLED_SYRINGE | Freq: Once | SUBCUTANEOUS | Status: AC
  Administered 2020-07-16: 6 mg via SUBCUTANEOUS
  Filled 2020-07-16: qty 0.6

## 2020-07-16 NOTE — Progress Notes (Signed)
.  Lauren Ortega presents today for injection per the provider's orders.  Ziextenzo  administration without incident; injection site WNL; see MAR for injection details.  Patient tolerated procedure well and without incident.  No questions or complaints noted at this time. Pt was stable before and after discharge. Pt was discharged ambulatory in satisfactory condition.

## 2020-07-16 NOTE — Patient Instructions (Signed)
Lauren Ortega at [facility] Discharge Instructions  You received your injection to increase your WBC's. Follow-up as scheduled   Thank you for choosing [facility] at [facility] to provide your oncology and hematology care.  To afford each patient quality time with our provider, please arrive at least 15 minutes before your scheduled appointment time.   If you have a lab appointment with the [facility] please come in thru the Main Entrance and check in at the main information desk.  You need to re-schedule your appointment should you arrive 10 or more minutes late.  We strive to give you quality time with our providers, and arriving late affects you and other patients whose appointments are after yours.  Also, if you no show three or more times for appointments you may be dismissed from the clinic at the providers discretion.     Again, thank you for choosing [facility].  Our hope is that these requests will decrease the amount of time that you wait before being seen by our physicians.       _____________________________________________________________  Should you have questions after your visit to [facility], please contact our office at [phone] and follow the prompts.  Our office hours are 8:00 a.m. and 4:30 p.m. Monday - Friday.  Please note that voicemails left after 4:00 p.m. may not be returned until the following business day.  We are closed weekends and major holidays.  You do have access to a nurse 24-7, just call the main number to the clinic [phone] and do not press any options, hold on the line and a nurse will answer the phone.    For prescription refill requests, have your pharmacy contact our office and allow 72 hours.    Due to Covid, you will need to wear a mask upon entering the hospital. If you do not have a mask, a mask will be given to you at the Main Entrance upon arrival. For doctor visits,  patients may have 1 support person age 57 or older with them. For treatment visits, patients can not have anyone with them due to social distancing guidelines and our immunocompromised population.

## 2020-07-23 ENCOUNTER — Encounter: Payer: Self-pay | Admitting: Radiology

## 2020-07-23 NOTE — Progress Notes (Signed)
[id number] - TREATMENT OF REFRACTORY NAUSEA   07/23/2020    11:15AM  Received four day home record in the mail for the above mentioned study. Will upload into [place].   [name], RT(R)(T) Clinical Research Coordinator

## 2020-07-29 ENCOUNTER — Encounter (HOSPITAL_COMMUNITY): Payer: Self-pay

## 2020-07-29 NOTE — Progress Notes (Signed)
Patient called today reporting LUE swelling and "feeling tight," this had improved after starting eliquis. Dr. Lorenso Courier made aware and recommended elevation and compression of the LUE. I called the patient back and left this as a detailed VM and encouraged her to call the clinic with any further questions or concerns.

## 2020-08-04 NOTE — Progress Notes (Signed)
Lauren Ortega, Lauren Ortega [id number]   CLINIC:  Medical Oncology/Hematology  PCP:  [name], [address] / [place] [id number] [phone]   REASON FOR VISIT:  Follow-up for right breast cancer  PRIOR THERAPY: none  NGS Results: HER2+  CURRENT THERAPY: Docetaxel + Carboplatin + Trastuzumab (TCH) q21d / Trastuzumab q21d  BRIEF ONCOLOGIC HISTORY:  Oncology History  Malignant neoplasm of upper-outer quadrant of right female breast ([place])  04/16/2020 Initial Diagnosis   Malignant neoplasm of upper-outer quadrant of right female breast [place])    Genetic Testing   Negative genetic testing. No pathogenic variants identified on the [facility] Multi-Cancer Panel+RNA. The report date is 05/10/2020.  The Multi-Cancer Panel + RNA offered by [facility] includes sequencing and/or deletion duplication testing of the following 84 genes: AIP, ALK, APC, ATM, AXIN2,BAP1,  BARD1, BLM, BMPR1A, BRCA1, BRCA2, BRIP1, CASR, CDC73, CDH1, CDK4, CDKN1B, CDKN1C, CDKN2A (p14ARF), CDKN2A (p16INK4a), CEBPA, CHEK2, CTNNA1, DICER1, DIS3L2, EGFR (c.2369C>T, p.Thr790Met variant only), EPCAM (Deletion/duplication testing only), FH, FLCN, GATA2, GPC3, GREM1 (Promoter region deletion/duplication testing only), HOXB13 (c.251G>A, p.Gly84Glu), HRAS, KIT, MAX, MEN1, MET, MITF (c.952G>A, p.Glu318Lys variant only), MLH1, MSH2, MSH3, MSH6, MUTYH, NBN, NF1, NF2, NTHL1, PALB2, PDGFRA, PHOX2B, PMS2, POLD1, POLE, POT1, PRKAR1A, PTCH1, PTEN, RAD50, RAD51C, RAD51D, RB1, RECQL4, RET, RUNX1, SDHAF2, SDHA (sequence changes only), SDHB, SDHC, SDHD, SMAD4, SMARCA4, SMARCB1, SMARCE1, STK11, SUFU, TERC, TERT, TMEM127, TP53, TSC1, TSC2, VHL, WRN and WT1.   06/23/2020 -  Chemotherapy    Patient is on Treatment Plan: BREAST DOCETAXEL + CARBOPLATIN + TRASTUZUMAB ([place]) Q21D / TRASTUZUMAB Q21D         CANCER STAGING: Cancer Staging Malignant neoplasm of upper-outer quadrant of right female breast  ([place]) Staging form: Breast, AJCC 8th Edition - Clinical stage from 04/21/2020: Stage IA (cT1c, cN0, cM0, G3, ER+, PR-, HER2+) - Unsigned  Virtual Visit via Video Note  I connected with Lauren Ortega on '@TODAY' @ at 10:30 AM EDT by a video enabled telemedicine application and verified that I am speaking with the correct person using two identifiers.  Location: Patient: Lauren Ortega in the office Provider: Derek Jack, MD at home Other persons present: Lauren Lombard, RN   I discussed the limitations of evaluation and management by telemedicine and the availability of in person appointments. The patient expressed understanding and agreed to proceed.  INTERVAL HISTORY:  Lauren Ortega, a 40 y.o. female, returns for routine follow-up and consideration for next cycle of chemotherapy. Lauren Ortega was last seen on 07/06/20.  Due for cycle #3 of [place] today.   Overall, she tells me she has been feeling pretty well. She denies any recent fevers, infections, and nausea, but reports diarrhea and constipation directly following treatment. She has lost about 3  lbs since her last treatment. She reports changes in her taste buds for the first week and a half after treatment which makes most foods taste unappetizing during that time. Her last menses was on 07/07/20 and lasted for 2 weeks with an unusually heavy flow. She denies any changes in her fingernails, and she reports tolerable dryness of the skin on her hands which she is effectively treating with lotion.   Overall, she feels ready for next cycle of chemo today.   REVIEW OF SYSTEMS:  Review of Systems  Constitutional:  Negative for fever.  Gastrointestinal:  Positive for constipation and diarrhea. Negative for nausea.  Genitourinary:  Positive for menstrual problem (heavy flow; lasts 2 weeks).  All other systems reviewed and are negative.  PAST MEDICAL/SURGICAL HISTORY:  Past Medical History:  Diagnosis Date   Anemia    Anxiety    Breast cancer  ([place]) 03/2020   Family history of colon cancer    Family history of lymphoma    Family history of pancreatic cancer    Family history of skin cancer    Hypertension    Port-A-Cath in place 06/16/2020   Past Surgical History:  Procedure Laterality Date   BREAST LUMPECTOMY WITH RADIOACTIVE SEED AND SENTINEL LYMPH NODE BIOPSY Right 05/20/2020   Procedure: RIGHT BREAST LUMPECTOMY WITH RADIOACTIVE SEED AND SENTINEL LYMPH NODE BIOPSY;  Surgeon: Coralie Keens, MD;  Location: [facility];  Service: General;  Laterality: Right;   PORTACATH PLACEMENT Left 05/20/2020   Procedure: INSERTION PORT-A-CATH;  Surgeon: Coralie Keens, MD;  Location: [facility];  Service: General;  Laterality: Left;   TONSILLECTOMY     TUBAL LIGATION      SOCIAL HISTORY:  Social History   Socioeconomic History   Marital status: Divorced    Spouse name: Not on file   Number of children: Not on file   Years of education: Not on file   Highest education level: Not on file  Occupational History   Not on file  Tobacco Use   Smoking status: Never   Smokeless tobacco: Never  Substance and Sexual Activity   Alcohol use: Yes    Comment: occassionally   Drug use: Never   Sexual activity: Yes    Birth control/protection: Surgical  Other Topics Concern   Not on file  Social History Narrative   Not on file   Social Determinants of Health   Financial Resource Strain: Low Risk    Difficulty of Paying Living Expenses: Not hard at all  Food Insecurity: No Food Insecurity   Worried About Charity fundraiser in the Last Year: Never true   Arboriculturist in the Last Year: Never true  Transportation Needs: No Transportation Needs   Lack of Transportation (Medical): No   Lack of Transportation (Non-Medical): No  Physical Activity: Insufficiently Active   Days of Exercise per Week: 2 days   Minutes of Exercise per Session: 20 min  Stress: No Stress Concern Present   Feeling of Stress  : Not at all  Social Connections: Moderately Integrated   Frequency of Communication with Friends and Family: More than three times a week   Frequency of Social Gatherings with Friends and Family: More than three times a week   Attends Religious Services: 1 to 4 times per year   Active Member of [employer] or Organizations: No   Attends Music therapist: 1 to 4 times per year   Marital Status: Divorced  Human resources officer Violence: Not At Risk   Fear of Current or Ex-Partner: No   Emotionally Abused: No   Physically Abused: No   Sexually Abused: No    FAMILY HISTORY:  Family History  Problem Relation Age of Onset   Basal cell carcinoma Mother 74   Bladder Cancer Maternal Uncle 72   Lymphoma Maternal Uncle 22       [place]s    Pancreatic cancer Maternal Grandmother    Non-Hodgkin's lymphoma Maternal Grandfather    Lung cancer Paternal Grandfather    Colon cancer Other        x8 or 9   Colon cancer Maternal Great-grandfather     CURRENT MEDICATIONS:  Current Outpatient Medications  Medication Sig Dispense Refill   amLODipine (NORVASC) 10 MG tablet Take 10 mg by mouth daily.     apixaban (ELIQUIS) 5 MG TABS tablet Take 1 tablet (5 mg total) by mouth 2 (two) times daily. 60 tablet 3   APIXABAN (ELIQUIS) VTE STARTER PACK (10MG AND 5MG) Take as directed on package: start with two-81m tablets twice daily for 7 days. On day 8, switch to one-57mtablet twice daily. 1 each 0   Baclofen 5 MG TABS Take 5 mg by mouth 3 (three) times daily as needed. 30 tablet 0   CARBOPLATIN IV Inject into the vein every 21 ( twenty-one) days.     DOCETAXEL IV Inject into the vein every 21 ( twenty-one) days.     FERREX 150 150 MG capsule Take 150 mg by mouth daily.     folic acid (FOLVITE) 1 MG tablet Take by mouth.     lidocaine-prilocaine (EMLA) cream Apply a small amount to port a cath site and cover with plastic wrap 1 hour prior to infusion appointments 30 g 3   meloxicam (MOBIC) 15 MG  tablet Take 15 mg by mouth daily as needed.     oxyCODONE (OXY IR/ROXICODONE) 5 MG immediate release tablet Take 1 tablet (5 mg total) by mouth every 6 (six) hours as needed for moderate pain or severe pain. 25 tablet 0   prochlorperazine (COMPAZINE) 10 MG tablet Take 1 tablet (10 mg total) by mouth every 6 (six) hours as needed (Nausea or vomiting). 30 tablet 1   sertraline (ZOLOFT) 50 MG tablet Take 50 mg by mouth daily.     spironolactone (ALDACTONE) 50 MG tablet Take 50 mg by mouth every morning.     trastuzumab in sodium chloride 0.9 % 250 mL Inject into the vein every 21 ( twenty-one) days.     vitamin B-12 (CYANOCOBALAMIN) 1000 MCG tablet Take 1 tablet (1,000 mcg total) by mouth daily. 90 tablet 1   No current facility-administered medications for this visit.    ALLERGIES:  No Known Allergies  Performance status (ECOG): 0 - Asymptomatic  There were no vitals filed for this visit. Wt Readings from Last 3 Encounters:  07/14/20 135 lb 6.4 oz (61.4 kg)  07/06/20 136 lb 1.6 oz (61.7 kg)  06/23/20 133 lb 9.6 oz (60.6 kg)    LABORATORY DATA:  I have reviewed the labs as listed.  CBC Latest Ref Rng & Units 07/14/2020 06/23/2020  WBC 4.0 - 10.5 K/uL 8.5 8.0  Hemoglobin 12.0 - 15.0 g/dL 9.3(L) 12.5  Hematocrit 36.0 - 46.0 % 28.4(L) 38.6  Platelets 150 - 400 K/uL 441(H) 368   CMP Latest Ref Rng & Units 07/14/2020 06/23/2020 05/19/2020  Glucose 70 - 99 mg/dL 100(H) 101(H) 84  BUN 6 - 20 mg/dL '14 12 12  ' Creatinine 0.44 - 1.00 mg/dL 0.72 0.60 0.66  Sodium 135 - 145 mmol/L 136 136 135  Potassium 3.5 - 5.1 mmol/L 3.8 3.5 4.5  Chloride 98 - 111 mmol/L 105 103 104  CO2 22 - 32 mmol/L '25 26 22  ' Calcium 8.9 - 10.3 mg/dL 8.7(L) 9.1 9.5  Total Protein 6.5 - 8.1 g/dL 6.5 7.3 -  Total Bilirubin 0.3 - 1.2 mg/dL 0.5 0.6 -  Alkaline Phos 38 - 126 U/L 83 71 -  AST 15 - 41 U/L 24 21 -  ALT 0 - 44 U/L 25 15 -    DIAGNOSTIC IMAGING:  I have independently reviewed the scans and discussed with the  patient.  US Venous Img Upper Uni Left  Result Date: 07/06/2020 CLINICAL DATA:  Left arm swelling with port catheter in place. EXAM: LEFT UPPER EXTREMITY VENOUS DOPPLER ULTRASOUND TECHNIQUE: Gray-scale sonography with graded compression, as well as color Doppler and duplex ultrasound were performed to evaluate the upper extremity deep venous system from the level of the subclavian vein and including the jugular, axillary, basilic, radial, ulnar and upper cephalic vein. Spectral Doppler was utilized to evaluate flow at rest and with distal augmentation maneuvers. COMPARISON:  None. FINDINGS: Contralateral Subclavian Vein: Respiratory phasicity is normal and symmetric with the symptomatic side. No evidence of thrombus. Normal compressibility. Internal Jugular Vein: No evidence of thrombus. Normal compressibility, respiratory phasicity and response to augmentation. Subclavian Vein: Port catheter seen. Partially occlusive thrombus also evident. There is string like flow on color Doppler imaging. The more peripheral aspect of the subclavian vein demonstrates occlusive thrombus. Axillary Vein: Occlusive thrombus extends into the axillary vein. Cephalic Vein: No evidence of thrombus. Normal compressibility, respiratory phasicity and response to augmentation. Basilic Vein: Occlusive thrombus present within the basilic vein. Brachial Veins: No evidence of thrombus. Normal compressibility, respiratory phasicity and response to augmentation. Radial Veins: No evidence of thrombus. Normal compressibility, respiratory phasicity and response to augmentation. Ulnar Veins: No evidence of thrombus. Normal compressibility, respiratory phasicity and response to augmentation. Venous Reflux:  None visualized. Other Findings:  None visualized. IMPRESSION: 1. Positive for acute occlusive deep venous thrombosis within the left axillary and peripheral subclavian vein. 2. Positive for occlusive superficial venous thrombosis involving the  left basilic vein throughout the upper arm to the elbow. 3. Left subclavian approach port catheter. Nonocclusive thrombus is present around the port catheter within the mid subclavian vein. These results will be called to the ordering clinician or representative by the Radiologist Assistant, and communication documented in the PACS or [employer]. Electronically Signed   By: Jacqulynn Cadet M.D.   On: 07/06/2020 12:13     ASSESSMENT:  1.  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  She had mammogram/ultrasound on 03/18/2020 done in [place] which showed a 1.4 x 0.9 x 1.5 cm mass in the upper outer quadrant of the right breast. -Biopsy on 03/31/2020 consistent with intermediate to high-grade infiltrating ductal carcinoma of the right breast at 10:30 position, HER-2 3+ positive, Ki-67 40%, ER weak staining in less than 10% of tumor cells, PR negative. -MRI of the breast on 04/20/2020 with 1.3 x 1.5 x 1.4 cm irregular enhancing mass within the upper outer right breast.  No abnormal appearing lymph nodes. - Right breast lumpectomy and SLNB on 05/20/2020- grade 3 IDC, 1.8 cm, invasive carcinoma is less than 1 mm from anterior margin focally and less than 1 mm from the posterior margin broadly.  Margins negative for in situ carcinoma.  Lymphovascular invasion present.  0/5 lymph nodes positive.  There is focal ductal carcinoma within the vascular space in the soft tissue adjacent to the lymph node.  No carcinoma is seen within the lymph nodes.  PT1CPN0. - Genetic testing was negative. - 6 cycles of [place] with 1 year of Herceptin recommended.  Cycle 1 of [place] started on 06/23/2020.   2.  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  Never smoker. -Maternal grandmother had colon cancer.  Maternal grandfather died of non-Hodgkin's lymphoma.  Paternal  grandfather died of lung cancer.  Maternal uncle had multiple myeloma.  Mother had basal cell skin cancer.   PLAN:  1.  Stage I (T1 cN0 M0) right breast IDC, HER2 positive: - She has tolerated cycle 2 on 07/14/2020 reasonably well.  Denied any nausea.  She had some dry mouth. - She had constipation from nausea medication after day 1 of last treatment.  She took stool softener and had some diarrhea.  Imodium helps.  She also reported loss of appetite for 1 and half weeks which improved afterwards.  Denies any tingling or numbness in the extremities.  Denies any nail changes.  She denies any aches and pains after G-CSF injection.  She is taking Zyrtec which she will start tomorrow. - She had LMP on 07/07/2020 which lasted about 2 weeks since she was started on Eliquis.  She does not have any menses at this time. - Reviewed her labs which showed white count 5.5 and platelet count 379.  Creatinine was normal. - Proceed with cycle 3 without any dose modifications.  RTC 3 weeks for follow-up.   2.  High risk drug monitoring: - 2D echo on 06/02/2020 with EF 55 to 60%. - Plan to repeat 2D echo prior to next visit in 3 weeks.   3.  Normocytic anemia: -She has normocytic anemia with hemoglobin 10.5. - Labs on 06/23/2020 shows ferritin 8, percent saturation 8.  B12 is 168. - She is continuing iron tablet daily without any major side effects.  4.  Left upper extremity DVT: - Left upper extremity Doppler on 07/06/2020 positive for acute DVT in the left axillary, peripheral subclavian vein and superficial thrombosis involving left basilic vein. - Continue Eliquis twice daily. - She reports occasional discomfort in the left supraclavicular and axillary region with occasional swelling. - I have recommended compression hose. - We will continue anticoagulation until port is removed.   Orders placed this encounter:  No orders of the defined types were placed in this encounter.  I provided 30 minutes of  non-face-to-face time during this encounter.  Derek Jack, MD [facility] [phone]   I, Thana Ates, am acting as a scribe for Dr. Derek Jack.  I, Derek Jack MD, have reviewed the above documentation for accuracy and completeness, and I agree with the above.

## 2020-08-05 ENCOUNTER — Inpatient Hospital Stay (HOSPITAL_BASED_OUTPATIENT_CLINIC_OR_DEPARTMENT_OTHER): Payer: PRIVATE HEALTH INSURANCE | Admitting: Hematology

## 2020-08-05 ENCOUNTER — Other Ambulatory Visit: Payer: Self-pay

## 2020-08-05 ENCOUNTER — Inpatient Hospital Stay (HOSPITAL_COMMUNITY): Payer: PRIVATE HEALTH INSURANCE

## 2020-08-05 ENCOUNTER — Inpatient Hospital Stay (HOSPITAL_COMMUNITY): Payer: PRIVATE HEALTH INSURANCE | Attending: Hematology

## 2020-08-05 VITALS — BP 124/78 | HR 82 | Temp 97.1°F | Resp 18 | Wt 132.0 lb

## 2020-08-05 VITALS — BP 111/65 | HR 78 | Temp 98.1°F | Resp 18

## 2020-08-05 DIAGNOSIS — Z79899 Other long term (current) drug therapy: Secondary | ICD-10-CM | POA: Diagnosis not present

## 2020-08-05 DIAGNOSIS — C50411 Malignant neoplasm of upper-outer quadrant of right female breast: Secondary | ICD-10-CM | POA: Diagnosis present

## 2020-08-05 DIAGNOSIS — Z95828 Presence of other vascular implants and grafts: Secondary | ICD-10-CM

## 2020-08-05 DIAGNOSIS — Z5189 Encounter for other specified aftercare: Secondary | ICD-10-CM | POA: Insufficient documentation

## 2020-08-05 DIAGNOSIS — Z5111 Encounter for antineoplastic chemotherapy: Secondary | ICD-10-CM | POA: Diagnosis present

## 2020-08-05 DIAGNOSIS — Z170 Estrogen receptor positive status [ER+]: Secondary | ICD-10-CM

## 2020-08-05 DIAGNOSIS — Z5112 Encounter for antineoplastic immunotherapy: Secondary | ICD-10-CM | POA: Insufficient documentation

## 2020-08-05 LAB — CBC WITH DIFFERENTIAL/PLATELET
Abs Immature Granulocytes: 0.03 10*3/uL (ref 0.00–0.07)
Basophils Absolute: 0.1 10*3/uL (ref 0.0–0.1)
Basophils Relative: 1 %
Eosinophils Absolute: 0.1 10*3/uL (ref 0.0–0.5)
Eosinophils Relative: 1 %
HCT: 32.3 % — ABNORMAL LOW (ref 36.0–46.0)
Hemoglobin: 10.5 g/dL — ABNORMAL LOW (ref 12.0–15.0)
Immature Granulocytes: 1 %
Lymphocytes Relative: 16 %
Lymphs Abs: 0.9 10*3/uL (ref 0.7–4.0)
MCH: 31.2 pg (ref 26.0–34.0)
MCHC: 32.5 g/dL (ref 30.0–36.0)
MCV: 95.8 fL (ref 80.0–100.0)
Monocytes Absolute: 0.9 10*3/uL (ref 0.1–1.0)
Monocytes Relative: 16 %
Neutro Abs: 3.6 10*3/uL (ref 1.7–7.7)
Neutrophils Relative %: 65 %
Platelets: 379 10*3/uL (ref 150–400)
RBC: 3.37 MIL/uL — ABNORMAL LOW (ref 3.87–5.11)
RDW: 18.4 % — ABNORMAL HIGH (ref 11.5–15.5)
WBC: 5.5 10*3/uL (ref 4.0–10.5)
nRBC: 0 % (ref 0.0–0.2)

## 2020-08-05 LAB — COMPREHENSIVE METABOLIC PANEL
ALT: 32 U/L (ref 0–44)
AST: 39 U/L (ref 15–41)
Albumin: 4.1 g/dL (ref 3.5–5.0)
Alkaline Phosphatase: 84 U/L (ref 38–126)
Anion gap: 9 (ref 5–15)
BUN: 12 mg/dL (ref 6–20)
CO2: 26 mmol/L (ref 22–32)
Calcium: 9.2 mg/dL (ref 8.9–10.3)
Chloride: 101 mmol/L (ref 98–111)
Creatinine, Ser: 0.84 mg/dL (ref 0.44–1.00)
GFR, Estimated: >60 mL/min (ref >60)
Glucose, Bld: 123 mg/dL — ABNORMAL HIGH (ref 70–99)
Potassium: 3.9 mmol/L (ref 3.5–5.1)
Sodium: 136 mmol/L (ref 135–145)
Total Bilirubin: 0.6 mg/dL (ref 0.3–1.2)
Total Protein: 7.3 g/dL (ref 6.5–8.1)

## 2020-08-05 LAB — MAGNESIUM: Magnesium: 1.8 mg/dL (ref 1.7–2.4)

## 2020-08-05 MED ORDER — TRASTUZUMAB-DKST CHEMO 150 MG IV SOLR
6.0000 mg/kg | Freq: Once | INTRAVENOUS | Status: AC
  Administered 2020-08-05: 378 mg via INTRAVENOUS
  Filled 2020-08-05: qty 18

## 2020-08-05 MED ORDER — SODIUM CHLORIDE 0.9 % IV SOLN
662.4000 mg | Freq: Once | INTRAVENOUS | Status: AC
  Administered 2020-08-05: 660 mg via INTRAVENOUS
  Filled 2020-08-05: qty 66

## 2020-08-05 MED ORDER — SODIUM CHLORIDE 0.9 % IV SOLN
150.0000 mg | Freq: Once | INTRAVENOUS | Status: AC
  Administered 2020-08-05: 150 mg via INTRAVENOUS
  Filled 2020-08-05: qty 150

## 2020-08-05 MED ORDER — SODIUM CHLORIDE 0.9 % IV SOLN
75.0000 mg/m2 | Freq: Once | INTRAVENOUS | Status: AC
  Administered 2020-08-05: 120 mg via INTRAVENOUS
  Filled 2020-08-05: qty 12

## 2020-08-05 MED ORDER — PALONOSETRON HCL INJECTION 0.25 MG/5ML
0.2500 mg | Freq: Once | INTRAVENOUS | Status: AC
  Administered 2020-08-05: 0.25 mg via INTRAVENOUS

## 2020-08-05 MED ORDER — HEPARIN SOD (PORK) LOCK FLUSH 100 UNIT/ML IV SOLN
500.0000 [IU] | Freq: Once | INTRAVENOUS | Status: AC | PRN
  Administered 2020-08-05: 500 [IU]

## 2020-08-05 MED ORDER — PALONOSETRON HCL INJECTION 0.25 MG/5ML
INTRAVENOUS | Status: AC
  Filled 2020-08-05: qty 5

## 2020-08-05 MED ORDER — ACETAMINOPHEN 325 MG PO TABS
ORAL_TABLET | ORAL | Status: AC
  Filled 2020-08-05: qty 2

## 2020-08-05 MED ORDER — DIPHENHYDRAMINE HCL 25 MG PO CAPS
ORAL_CAPSULE | ORAL | Status: AC
  Filled 2020-08-05: qty 2

## 2020-08-05 MED ORDER — SODIUM CHLORIDE 0.9 % IV SOLN: Freq: Once | INTRAVENOUS | Status: AC

## 2020-08-05 MED ORDER — DIPHENHYDRAMINE HCL 25 MG PO CAPS
50.0000 mg | ORAL_CAPSULE | Freq: Once | ORAL | Status: AC
  Administered 2020-08-05: 50 mg via ORAL

## 2020-08-05 MED ORDER — SODIUM CHLORIDE 0.9 % IV SOLN
10.0000 mg | Freq: Once | INTRAVENOUS | Status: AC
  Administered 2020-08-05: 10 mg via INTRAVENOUS
  Filled 2020-08-05: qty 10

## 2020-08-05 MED ORDER — ACETAMINOPHEN 325 MG PO TABS
650.0000 mg | ORAL_TABLET | Freq: Once | ORAL | Status: AC
  Administered 2020-08-05: 650 mg via ORAL

## 2020-08-05 MED ORDER — SODIUM CHLORIDE 0.9% FLUSH
10.0000 mL | INTRAVENOUS | Status: DC | PRN
  Administered 2020-08-05: 10 mL

## 2020-08-05 NOTE — Progress Notes (Signed)
Pt tolerated treatment well today without incidence.  Stable during and after infusion.  AVS reviewed.  Vital signs stable prior to discharge.  Discharged in stable condition ambulatory.

## 2020-08-05 NOTE — Progress Notes (Signed)
Patient has been assessed, vital signs and labs have been reviewed by Dr. Katragadda. ANC, Creatinine, LFTs, and Platelets are within treatment parameters per Dr. Katragadda. The patient is good to proceed with treatment at this time. Primary RN and pharmacy aware.  

## 2020-08-05 NOTE — Patient Instructions (Addendum)
Lauren Ortega at [facility] Discharge Instructions  You were seen today by Dr. Delton Coombes. He went over your recent results, and you received your treatment. You will be scheduled for an echocardiogram prior to your next appointment. Dr. Delton Coombes will see you back in 3 weeks for labs and follow up.   Thank you for choosing [facility] at [facility] to provide your oncology and hematology care.  To afford each patient quality time with our provider, please arrive at least 15 minutes before your scheduled appointment time.   If you have a lab appointment with the [facility] please come in thru the Main Entrance and check in at the main information desk  You need to re-schedule your appointment should you arrive 10 or more minutes late.  We strive to give you quality time with our providers, and arriving late affects you and other patients whose appointments are after yours.  Also, if you no show three or more times for appointments you may be dismissed from the clinic at the providers discretion.     Again, thank you for choosing [facility].  Our hope is that these requests will decrease the amount of time that you wait before being seen by our physicians.       _____________________________________________________________  Should you have questions after your visit to [facility], please contact our office at [phone] between the hours of 8:00 a.m. and 4:30 p.m.  Voicemails left after 4:00 p.m. will not be returned until the following business day.  For prescription refill requests, have your pharmacy contact our office and allow 72 hours.    Cancer Center Support Programs:   > Cancer Support Group  2nd Tuesday of the month 1pm-2pm, Journey Room

## 2020-08-05 NOTE — Patient Instructions (Signed)
[facility]  Discharge Instructions: Thank you for choosing [facility] to provide your oncology and hematology care.  If you have a lab appointment with the [facility], please come in thru the Main Entrance and check in at the main information desk.  Wear comfortable clothing and clothing appropriate for easy access to any Portacath or PICC line.   We strive to give you quality time with your provider. You may need to reschedule your appointment if you arrive late (15 or more minutes).  Arriving late affects you and other patients whose appointments are after yours.  Also, if you miss three or more appointments without notifying the office, you may be dismissed from the clinic at the provider's discretion.      For prescription refill requests, have your pharmacy contact our office and allow 72 hours for refills to be completed.    Today you received the following chemotherapy and/or immunotherapy agents [facility]      To help prevent nausea and vomiting after your treatment, we encourage you to take your nausea medication as directed.  BELOW ARE SYMPTOMS THAT SHOULD BE REPORTED IMMEDIATELY: *FEVER GREATER THAN 100.4 F (38 C) OR HIGHER *CHILLS OR SWEATING *NAUSEA AND VOMITING THAT IS NOT CONTROLLED WITH YOUR NAUSEA MEDICATION *UNUSUAL SHORTNESS OF BREATH *UNUSUAL BRUISING OR BLEEDING *URINARY PROBLEMS (pain or burning when urinating, or frequent urination) *BOWEL PROBLEMS (unusual diarrhea, constipation, pain near the anus) TENDERNESS IN MOUTH AND THROAT WITH OR WITHOUT PRESENCE OF ULCERS (sore throat, sores in mouth, or a toothache) UNUSUAL RASH, SWELLING OR PAIN  UNUSUAL VAGINAL DISCHARGE OR ITCHING   Items with * indicate a potential emergency and should be followed up as soon as possible or go to the Emergency Department if any problems should occur.  Please show the CHEMOTHERAPY ALERT CARD or IMMUNOTHERAPY ALERT CARD at check-in to the Emergency Department  and triage nurse.  Should you have questions after your visit or need to cancel or reschedule your appointment, please contact [facility] [phone]  and follow the prompts.  Office hours are 8:00 a.m. to 4:30 p.m. Monday - Friday. Please note that voicemails left after 4:00 p.m. may not be returned until the following business day.  We are closed weekends and major holidays. You have access to a nurse at all times for urgent questions. Please call the main number to the clinic [phone] and follow the prompts.  For any non-urgent questions, you may also contact your provider using MyChart. We now offer e-Visits for anyone 72 and older to request care online for non-urgent symptoms. For details visit [email or web address].   Also download the MyChart app! Go to the app store, search "MyChart", open the app, select [facility], and log in with your MyChart username and password.  Due to Covid, a mask is required upon entering the hospital/clinic. If you do not have a mask, one will be given to you upon arrival. For doctor visits, patients may have 1 support person aged 52 or older with them. For treatment visits, patients cannot have anyone with them due to current Covid guidelines and our immunocompromised population.   Trastuzumab injection for infusion What is this medication? TRASTUZUMAB (tras TOO zoo mab) is a monoclonal antibody. It is used to treatbreast cancer and stomach cancer. This medicine may be used for other purposes; ask your health care provider orpharmacist if you have questions. COMMON BRAND NAME(S): Herceptin, Janae Bridgeman, Ontruzant, Trazimera What should I tell my  care team before I take this medication? They need to know if you have any of these conditions: heart disease heart failure lung or breathing disease, like asthma an unusual or allergic reaction to trastuzumab, benzyl alcohol, or other medications, foods, dyes, or  preservatives pregnant or trying to get pregnant breast-feeding How should I use this medication? This drug is given as an infusion into a vein. It is administered in a hospitalor clinic by a specially trained health care professional. Talk to your pediatrician regarding the use of this medicine in children. Thismedicine is not approved for use in children. Overdosage: If you think you have taken too much of this medicine contact apoison control center or emergency room at once. NOTE: This medicine is only for you. Do not share this medicine with others. What if I miss a dose? It is important not to miss a dose. Call your doctor or health careprofessional if you are unable to keep an appointment. What may interact with this medication? This medicine may interact with the following medications: certain types of chemotherapy, such as daunorubicin, doxorubicin, epirubicin, and idarubicin This list may not describe all possible interactions. Give your health care provider a list of all the medicines, herbs, non-prescription drugs, or dietary supplements you use. Also tell them if you smoke, drink alcohol, or use illegaldrugs. Some items may interact with your medicine. What should I watch for while using this medication? Visit your doctor for checks on your progress. Report any side effects. Continue your course of treatment even though you feel ill unless your doctortells you to stop. Call your doctor or health care professional for advice if you get a fever, chills or sore throat, or other symptoms of a cold or flu. Do not treatyourself. Try to avoid being around people who are sick. You may experience fever, chills and shaking during your first infusion. These effects are usually mild and can be treated with other medicines. Report any side effects during the infusion to your health care professional. Fever andchills usually do not happen with later infusions. Do not become pregnant while taking  this medicine or for 7 months after stopping it. Women should inform their doctor if they wish to become pregnant or think they might be pregnant. Women of child-bearing potential will need to have a negative pregnancy test before starting this medicine. There is a potential for serious side effects to an unborn child. Talk to your health care professional or pharmacist for more information. Do not breast-feed an infantwhile taking this medicine or for 7 months after stopping it. Women must use effective birth control with this medicine. What side effects may I notice from receiving this medication? Side effects that you should report to your doctor or health care professionalas soon as possible: allergic reactions like skin rash, itching or hives, swelling of the face, lips, or tongue chest pain or palpitations cough dizziness feeling faint or lightheaded, falls fever general ill feeling or flu-like symptoms signs of worsening heart failure like breathing problems; swelling in your legs and feet unusually weak or tired Side effects that usually do not require medical attention (report to yourdoctor or health care professional if they continue or are bothersome): bone pain changes in taste diarrhea joint pain nausea/vomiting weight loss This list may not describe all possible side effects. Call your doctor for medical advice about side effects. You may report side effects to FDA at1-800-FDA-1088. Where should I keep my medication? This drug is given in a hospital or  clinic and will not be stored at home. NOTE: This sheet is a summary. It may not cover all possible information. If you have questions about this medicine, talk to your doctor, pharmacist, orhealth care provider.  2022 Elsevier/Gold Standard (2016-01-05 14:37:52)    Docetaxel injection What is this medication? DOCETAXEL (doe se TAX el) is a chemotherapy drug. It targets fast dividing cells, like cancer cells, and causes  these cells to die. This medicine is used to treat many types of cancers like breast cancer, certain stomach cancers,head and neck cancer, lung cancer, and prostate cancer. This medicine may be used for other purposes; ask your health care provider orpharmacist if you have questions. COMMON BRAND NAME(S): Docefrez, Taxotere What should I tell my care team before I take this medication? They need to know if you have any of these conditions: infection (especially a virus infection such as chickenpox, cold sores, or herpes) liver disease low blood counts, like low white cell, platelet, or red cell counts an unusual or allergic reaction to docetaxel, polysorbate 80, other chemotherapy agents, other medicines, foods, dyes, or preservatives pregnant or trying to get pregnant breast-feeding How should I use this medication? This drug is given as an infusion into a vein. It is administered in a hospitalor clinic by a specially trained health care professional. Talk to your pediatrician regarding the use of this medicine in children.Special care may be needed. Overdosage: If you think you have taken too much of this medicine contact apoison control center or emergency room at once. NOTE: This medicine is only for you. Do not share this medicine with others. What if I miss a dose? It is important not to miss your dose. Call your doctor or health careprofessional if you are unable to keep an appointment. What may interact with this medication? Do not take this medicine with any of the following medications: live virus vaccines This medicine may also interact with the following medications: aprepitant certain antibiotics like erythromycin or clarithromycin certain antivirals for HIV or hepatitis certain medicines for fungal infections like fluconazole, itraconazole, ketoconazole, posaconazole, or voriconazole cimetidine ciprofloxacin conivaptan cyclosporine dronedarone fluvoxamine grapefruit  juice imatinib verapamil This list may not describe all possible interactions. Give your health care provider a list of all the medicines, herbs, non-prescription drugs, or dietary supplements you use. Also tell them if you smoke, drink alcohol, or use illegaldrugs. Some items may interact with your medicine. What should I watch for while using this medication? Your condition will be monitored carefully while you are receiving this medicine. You will need important blood work done while you are taking thismedicine. Call your doctor or health care professional for advice if you get a fever, chills or sore throat, or other symptoms of a cold or flu. Do not treat yourself. This drug decreases your body's ability to fight infections. Try toavoid being around people who are sick. Some products may contain alcohol. Ask your health care professional if this medicine contains alcohol. Be sure to tell all health care professionals you are taking this medicine. Certain medicines, like metronidazole and disulfiram, can cause an unpleasant reaction when taken with alcohol. The reaction includes flushing, headache, nausea, vomiting, sweating, and increased thirst. Thereaction can last from 30 minutes to several hours. You may get drowsy or dizzy. Do not drive, use machinery, or do anything that needs mental alertness until you know how this medicine affects you. Do not stand or sit up quickly, especially if you are an older patient. This reduces  the risk of dizzy or fainting spells. Alcohol may interfere with the effect ofthis medicine. Talk to your health care professional about your risk of cancer. You may bemore at risk for certain types of cancer if you take this medicine. Do not become pregnant while taking this medicine or for 6 months after stopping it. Women should inform their doctor if they wish to become pregnant or think they might be pregnant. There is a potential for serious side effects to an unborn  child. Talk to your health care professional or pharmacist for more information. Do not breast-feed an infant while taking this medicine orfor 1 week after stopping it. Males who get this medicine must use a condom during sex with females who can get pregnant. If you get a woman pregnant, the baby could have birth defects. The baby could die before they are born. You will need to continue wearing a condom for 3 months after stopping the medicine. Tell your health care providerright away if your partner becomes pregnant while you are taking this medicine. This may interfere with the ability to father a child. You should talk to yourdoctor or health care professional if you are concerned about your fertility. What side effects may I notice from receiving this medication? Side effects that you should report to your doctor or health care professionalas soon as possible: allergic reactions like skin rash, itching or hives, swelling of the face, lips, or tongue blurred vision breathing problems changes in vision low blood counts - This drug may decrease the number of white blood cells, red blood cells and platelets. You may be at increased risk for infections and bleeding. nausea and vomiting pain, redness or irritation at site where injected pain, tingling, numbness in the hands or feet redness, blistering, peeling, or loosening of the skin, including inside the mouth signs of decreased platelets or bleeding - bruising, pinpoint red spots on the skin, black, tarry stools, nosebleeds signs of decreased red blood cells - unusually weak or tired, fainting spells, lightheadedness signs of infection - fever or chills, cough, sore throat, pain or difficulty passing urine swelling of the ankle, feet, hands Side effects that usually do not require medical attention (report to yourdoctor or health care professional if they continue or are bothersome): constipation diarrhea fingernail or toenail changes hair  loss loss of appetite mouth sores muscle pain This list may not describe all possible side effects. Call your doctor for medical advice about side effects. You may report side effects to FDA at1-800-FDA-1088. Where should I keep my medication? This drug is given in a hospital or clinic and will not be stored at home. NOTE: This sheet is a summary. It may not cover all possible information. If you have questions about this medicine, talk to your doctor, pharmacist, orhealth care provider.  2022 Elsevier/Gold Standard (2018-12-10 19:50:31)   Carboplatin injection What is this medication? CARBOPLATIN (KAR boe pla tin) is a chemotherapy drug. It targets fast dividing cells, like cancer cells, and causes these cells to die. This medicine is usedto treat ovarian cancer and many other cancers. This medicine may be used for other purposes; ask your health care provider orpharmacist if you have questions. COMMON BRAND NAME(S): Paraplatin What should I tell my care team before I take this medication? They need to know if you have any of these conditions: blood disorders hearing problems kidney disease recent or ongoing radiation therapy an unusual or allergic reaction to carboplatin, cisplatin, other chemotherapy, other medicines, foods, dyes,  or preservatives pregnant or trying to get pregnant breast-feeding How should I use this medication? This drug is usually given as an infusion into a vein. It is administered in [place] or clinic by a specially trained health care professional. Talk to your pediatrician regarding the use of this medicine in children.Special care may be needed. Overdosage: If you think you have taken too much of this medicine contact apoison control center or emergency room at once. NOTE: This medicine is only for you. Do not share this medicine with others. What if I miss a dose? It is important not to miss a dose. Call your doctor or health careprofessional if you are  unable to keep an appointment. What may interact with this medication? medicines for seizures medicines to increase blood counts like filgrastim, pegfilgrastim, sargramostim some antibiotics like amikacin, gentamicin, neomycin, streptomycin, tobramycin vaccines Talk to your doctor or health care professional before taking any of thesemedicines: acetaminophen aspirin ibuprofen ketoprofen naproxen This list may not describe all possible interactions. Give your health care provider a list of all the medicines, herbs, non-prescription drugs, or dietary supplements you use. Also tell them if you smoke, drink alcohol, or use illegaldrugs. Some items may interact with your medicine. What should I watch for while using this medication? Your condition will be monitored carefully while you are receiving this medicine. You will need important blood work done while you are taking thismedicine. This drug may make you feel generally unwell. This is not uncommon, as chemotherapy can affect healthy cells as well as cancer cells. Report any side effects. Continue your course of treatment even though you feel ill unless yourdoctor tells you to stop. In some cases, you may be given additional medicines to help with side effects.Follow all directions for their use. Call your doctor or health care professional for advice if you get a fever, chills or sore throat, or other symptoms of a cold or flu. Do not treat yourself. This drug decreases your body's ability to fight infections. Try toavoid being around people who are sick. This medicine may increase your risk to bruise or bleed. Call your doctor orhealth care professional if you notice any unusual bleeding. Be careful brushing and flossing your teeth or using a toothpick because you may get an infection or bleed more easily. If you have any dental work done,tell your dentist you are receiving this medicine. Avoid taking products that contain aspirin,  acetaminophen, ibuprofen, naproxen, or ketoprofen unless instructed by your doctor. These medicines may hide afever. Do not become pregnant while taking this medicine. Women should inform their doctor if they wish to become pregnant or think they might be pregnant. There is a potential for serious side effects to an unborn child. Talk to your health care professional or pharmacist for more information. Do not breast-feed aninfant while taking this medicine. What side effects may I notice from receiving this medication? Side effects that you should report to your doctor or health care professionalas soon as possible: allergic reactions like skin rash, itching or hives, swelling of the face, lips, or tongue signs of infection - fever or chills, cough, sore throat, pain or difficulty passing urine signs of decreased platelets or bleeding - bruising, pinpoint red spots on the skin, black, tarry stools, nosebleeds signs of decreased red blood cells - unusually weak or tired, fainting spells, lightheadedness breathing problems changes in hearing changes in vision chest pain high blood pressure low blood counts - This drug may decrease the number of white  blood cells, red blood cells and platelets. You may be at increased risk for infections and bleeding. nausea and vomiting pain, swelling, redness or irritation at the injection site pain, tingling, numbness in the hands or feet problems with balance, talking, walking trouble passing urine or change in the amount of urine Side effects that usually do not require medical attention (report to yourdoctor or health care professional if they continue or are bothersome): hair loss loss of appetite metallic taste in the mouth or changes in taste This list may not describe all possible side effects. Call your doctor for medical advice about side effects. You may report side effects to FDA at1-800-FDA-1088. Where should I keep my medication? This drug is  given in a hospital or clinic and will not be stored at home. NOTE: This sheet is a summary. It may not cover all possible information. If you have questions about this medicine, talk to your doctor, pharmacist, orhealth care provider.  2022 Elsevier/Gold Standard (2007-04-17 14:38:05)

## 2020-08-05 NOTE — Progress Notes (Signed)
Pt here today for Taxotere, Carbo, and Herceptin. Labs WNL okay for treatment today per Dr. Raliegh Ip.

## 2020-08-07 ENCOUNTER — Other Ambulatory Visit: Payer: Self-pay

## 2020-08-07 ENCOUNTER — Inpatient Hospital Stay (HOSPITAL_COMMUNITY): Payer: PRIVATE HEALTH INSURANCE

## 2020-08-07 ENCOUNTER — Encounter (HOSPITAL_COMMUNITY): Payer: Self-pay

## 2020-08-07 VITALS — BP 127/83 | HR 74 | Temp 98.8°F | Resp 18

## 2020-08-07 DIAGNOSIS — Z95828 Presence of other vascular implants and grafts: Secondary | ICD-10-CM

## 2020-08-07 DIAGNOSIS — C50411 Malignant neoplasm of upper-outer quadrant of right female breast: Secondary | ICD-10-CM

## 2020-08-07 DIAGNOSIS — Z5112 Encounter for antineoplastic immunotherapy: Secondary | ICD-10-CM | POA: Diagnosis not present

## 2020-08-07 MED ORDER — PEGFILGRASTIM-BMEZ 6 MG/0.6ML ~~LOC~~ SOSY
6.0000 mg | PREFILLED_SYRINGE | Freq: Once | SUBCUTANEOUS | Status: AC
  Administered 2020-08-07: 6 mg via SUBCUTANEOUS
  Filled 2020-08-07: qty 0.6

## 2020-08-07 NOTE — Patient Instructions (Signed)
[facility]  Discharge Instructions: Thank you for choosing [facility] to provide your oncology and hematology care.  If you have a lab appointment with the Cancer Center, please come in thru the Main Entrance and check in at the main information desk.  Wear comfortable clothing and clothing appropriate for easy access to any Portacath or PICC line.   We strive to give you quality time with your provider. You may need to reschedule your appointment if you arrive late (15 or more minutes).  Arriving late affects you and other patients whose appointments are after yours.  Also, if you miss three or more appointments without notifying the office, you may be dismissed from the clinic at the provider's discretion.      For prescription refill requests, have your pharmacy contact our office and allow 72 hours for refills to be completed.    Today you received the following: Ziextenzo injection.       To help prevent nausea and vomiting after your treatment, we encourage you to take your nausea medication as directed.  BELOW ARE SYMPTOMS THAT SHOULD BE REPORTED IMMEDIATELY: *FEVER GREATER THAN 100.4 F (38 C) OR HIGHER *CHILLS OR SWEATING *NAUSEA AND VOMITING THAT IS NOT CONTROLLED WITH YOUR NAUSEA MEDICATION *UNUSUAL SHORTNESS OF BREATH *UNUSUAL BRUISING OR BLEEDING *URINARY PROBLEMS (pain or burning when urinating, or frequent urination) *BOWEL PROBLEMS (unusual diarrhea, constipation, pain near the anus) TENDERNESS IN MOUTH AND THROAT WITH OR WITHOUT PRESENCE OF ULCERS (sore throat, sores in mouth, or a toothache) UNUSUAL RASH, SWELLING OR PAIN  UNUSUAL VAGINAL DISCHARGE OR ITCHING   Items with * indicate a potential emergency and should be followed up as soon as possible or go to the Emergency Department if any problems should occur.  Please show the CHEMOTHERAPY ALERT CARD or IMMUNOTHERAPY ALERT CARD at check-in to the Emergency Department and triage  nurse.  Should you have questions after your visit or need to cancel or reschedule your appointment, please contact [facility] [phone]  and follow the prompts.  Office hours are 8:00 a.m. to 4:30 p.m. Monday - Friday. Please note that voicemails left after 4:00 p.m. may not be returned until the following business day.  We are closed weekends and major holidays. You have access to a nurse at all times for urgent questions. Please call the main number to the clinic [phone] and follow the prompts.  For any non-urgent questions, you may also contact your provider using MyChart. We now offer e-Visits for anyone 18 and older to request care online for non-urgent symptoms. For details visit [email or web address].   Also download the MyChart app! Go to the app store, search "MyChart", open the app, select [facility], and log in with your MyChart username and password.  Due to Covid, a mask is required upon entering the hospital/clinic. If you do not have a mask, one will be given to you upon arrival. For doctor visits, patients may have 1 support person aged 18 or older with them. For treatment visits, patients cannot have anyone with them due to current Covid guidelines and our immunocompromised population.  

## 2020-08-07 NOTE — Progress Notes (Signed)
Lauren Ortega presents today for injection per the provider's orders.  Ziextenzo administration without incident; injection site WNL; see MAR for injection details.  Patient tolerated procedure well and without incident and remained stable throughout.  No questions or complaints noted at this time. Discharged ambulatory in stable condition.

## 2020-08-18 ENCOUNTER — Other Ambulatory Visit: Payer: Self-pay

## 2020-08-18 ENCOUNTER — Ambulatory Visit (HOSPITAL_COMMUNITY)
Admission: RE | Admit: 2020-08-18 | Discharge: 2020-08-18 | Disposition: A | Discharge status: Home / Self Care | Payer: PRIVATE HEALTH INSURANCE | Source: Ambulatory Visit | Attending: Hematology | Admitting: Hematology

## 2020-08-18 DIAGNOSIS — C50411 Malignant neoplasm of upper-outer quadrant of right female breast: Secondary | ICD-10-CM | POA: Diagnosis not present

## 2020-08-18 DIAGNOSIS — Z0189 Encounter for other specified special examinations: Secondary | ICD-10-CM | POA: Diagnosis not present

## 2020-08-18 DIAGNOSIS — Z170 Estrogen receptor positive status [ER+]: Secondary | ICD-10-CM | POA: Diagnosis not present

## 2020-08-18 LAB — ECHOCARDIOGRAM COMPLETE
AR max vel: 1.68 cm2
AV Area VTI: 1.68 cm2
AV Area mean vel: 1.75 cm2
AV Mean grad: 6 mmHg
AV Peak grad: 12.5 mmHg
Ao pk vel: 1.77 m/s
Area-P 1/2: 3.63 cm2
Calc EF: 55.3 %
S' Lateral: 3.4 cm
Single Plane A2C EF: 51.3 %
Single Plane A4C EF: 59.2 %

## 2020-08-18 NOTE — Progress Notes (Signed)
*  PRELIMINARY RESULTS* Echocardiogram 2D Echocardiogram has been performed.  Samuel Germany 08/18/2020, 10:29 AM

## 2020-08-25 NOTE — Progress Notes (Signed)
[name], [place] [address]   CLINIC:  Medical Oncology/Hematology  PCP:  [name], [place] [address] / [place] [address] [phone]   REASON FOR VISIT:  Follow-up for right breast cancer  PRIOR THERAPY: none  NGS Results: Her2+  CURRENT THERAPY: Docetaxel + Carboplatin + Trastuzumab (TCH) q21d / Trastuzumab q21d  BRIEF ONCOLOGIC HISTORY:  Oncology History  Malignant neoplasm of upper-outer quadrant of right female breast ([place])  04/16/2020 Initial Diagnosis   Malignant neoplasm of upper-outer quadrant of right female breast [facility])    Genetic Testing   Negative genetic testing. No pathogenic variants identified on the [facility] Multi-Cancer Panel+RNA. The report date is 05/10/2020.  The Multi-Cancer Panel + RNA offered by [facility] includes sequencing and/or deletion duplication testing of the following 84 genes: AIP, ALK, APC, ATM, AXIN2,BAP1,  BARD1, BLM, BMPR1A, BRCA1, BRCA2, BRIP1, CASR, CDC73, CDH1, CDK4, CDKN1B, CDKN1C, CDKN2A (p14ARF), CDKN2A (p16INK4a), CEBPA, CHEK2, CTNNA1, DICER1, DIS3L2, EGFR (c.2369C>T, p.Thr790Met variant only), EPCAM (Deletion/duplication testing only), FH, FLCN, GATA2, GPC3, GREM1 (Promoter region deletion/duplication testing only), HOXB13 (c.251G>A, p.Gly84Glu), HRAS, KIT, MAX, MEN1, MET, MITF (c.952G>A, p.Glu318Lys variant only), MLH1, MSH2, MSH3, MSH6, MUTYH, NBN, NF1, NF2, NTHL1, PALB2, PDGFRA, PHOX2B, PMS2, POLD1, POLE, POT1, PRKAR1A, PTCH1, PTEN, RAD50, RAD51C, RAD51D, RB1, RECQL4, RET, RUNX1, SDHAF2, SDHA (sequence changes only), SDHB, SDHC, SDHD, SMAD4, SMARCA4, SMARCB1, SMARCE1, STK11, SUFU, TERC, TERT, TMEM127, TP53, TSC1, TSC2, VHL, WRN and WT1.   06/23/2020 -  Chemotherapy    Patient is on Treatment Plan: BREAST DOCETAXEL + CARBOPLATIN + TRASTUZUMAB ([place]) Q21D / TRASTUZUMAB Q21D         CANCER STAGING: Cancer Staging Malignant neoplasm of upper-outer quadrant of right female breast  ([place]) Staging form: Breast, AJCC 8th Edition - Clinical stage from 04/21/2020: Stage IA (cT1c, cN0, cM0, G3, ER+, PR-, HER2+) - Unsigned   INTERVAL HISTORY:  [name], a 40 y.o. female, returns for routine follow-up and consideration for next cycle of chemotherapy. [name] was last seen on 08/05/20.  Due for cycle #4 of [place] today.   Overall, she tells me she has been feeling pretty well. She reports chronic back pain which has worsened over the past 2 weeks, but does not prevent her from doing any of her daily activities. She has intermittent tingling in her fingers and toes but reports that it is tolerable, does not cause weakness, and occurs around every other day. Her last menses was 06/14. Her left arm is slightly swollen. She will be seeing a physical therapist next week for the lymphedema in her right arm. Her fingernails have darkened but are not tender, and she denies change to her toenails. For the first week following treatment she has diarrhea and her appetite is reduced, and she denies any nausea or vomiting. She also reports occasional ankle swellings, vaginal dryness and discomfort, and denies orthopnea and SOB.   Overall, she feels ready for next cycle of chemo today.   REVIEW OF SYSTEMS:  Review of Systems  Constitutional:  Negative for appetite change (80%) and fatigue (80%).  Respiratory:  Negative for shortness of breath.   Cardiovascular:  Positive for leg swelling.  Gastrointestinal:  Positive for diarrhea. Negative for nausea and vomiting.  Genitourinary:  Positive for difficulty urinating and dysuria. Negative for vaginal bleeding.        Vaginal dryness  Musculoskeletal:  Positive for back pain (6/10).  Neurological:  Positive for headaches and numbness (tingling fingers and  toes).  Psychiatric/Behavioral:  Positive for sleep disturbance (falling and staying asleep). The patient is nervous/anxious.   All other systems reviewed and are negative.  PAST  MEDICAL/SURGICAL HISTORY:  Past Medical History:  Diagnosis Date   Anemia    Anxiety    Breast cancer ([place]) 03/2020   Family history of colon cancer    Family history of lymphoma    Family history of pancreatic cancer    Family history of skin cancer    Hypertension    Port-A-Cath in place 06/16/2020   Past Surgical History:  Procedure Laterality Date   BREAST LUMPECTOMY WITH RADIOACTIVE SEED AND SENTINEL LYMPH NODE BIOPSY Right 05/20/2020   Procedure: RIGHT BREAST LUMPECTOMY WITH RADIOACTIVE SEED AND SENTINEL LYMPH NODE BIOPSY;  Surgeon: Coralie Keens, MD;  Location: [facility];  Service: General;  Laterality: Right;   PORTACATH PLACEMENT Left 05/20/2020   Procedure: INSERTION PORT-A-CATH;  Surgeon: Coralie Keens, MD;  Location: [facility];  Service: General;  Laterality: Left;   TONSILLECTOMY     TUBAL LIGATION      SOCIAL HISTORY:  Social History   Socioeconomic History   Marital status: Divorced    Spouse name: Not on file   Number of children: Not on file   Years of education: Not on file   Highest education level: Not on file  Occupational History   Not on file  Tobacco Use   Smoking status: Never   Smokeless tobacco: Never  Substance and Sexual Activity   Alcohol use: Yes    Comment: occassionally   Drug use: Never   Sexual activity: Yes    Birth control/protection: Surgical  Other Topics Concern   Not on file  Social History Narrative   Not on file   Social Determinants of Health   Financial Resource Strain: Low Risk    Difficulty of Paying Living Expenses: Not hard at all  Food Insecurity: No Food Insecurity   Worried About Charity fundraiser in the Last Year: Never true   Arboriculturist in the Last Year: Never true  Transportation Needs: No Transportation Needs   Lack of Transportation (Medical): No   Lack of Transportation (Non-Medical): No  Physical Activity: Insufficiently Active   Days of Exercise per Week:  2 days   Minutes of Exercise per Session: 20 min  Stress: No Stress Concern Present   Feeling of Stress : Not at all  Social Connections: Moderately Integrated   Frequency of Communication with Friends and Family: More than three times a week   Frequency of Social Gatherings with Friends and Family: More than three times a week   Attends Religious Services: 1 to 4 times per year   Active Member of [employer] or Organizations: No   Attends Music therapist: 1 to 4 times per year   Marital Status: Divorced  Human resources officer Violence: Not At Risk   Fear of Current or Ex-Partner: No   Emotionally Abused: No   Physically Abused: No   Sexually Abused: No    FAMILY HISTORY:  Family History  Problem Relation Age of Onset   Basal cell carcinoma Mother 43   Bladder Cancer Maternal Uncle 72   Lymphoma Maternal Uncle 7       [place]    Pancreatic cancer Maternal Grandmother    Non-Hodgkin's lymphoma Maternal Grandfather    Lung cancer Paternal Grandfather    Colon cancer Other        x8  or 9   Colon cancer Maternal Great-grandfather     CURRENT MEDICATIONS:  Current Outpatient Medications  Medication Sig Dispense Refill   amLODipine (NORVASC) 10 MG tablet Take 10 mg by mouth daily.     apixaban (ELIQUIS) 5 MG TABS tablet Take 1 tablet (5 mg total) by mouth 2 (two) times daily. 60 tablet 3   APIXABAN (ELIQUIS) VTE STARTER PACK (10MG AND 5MG) Take as directed on package: start with two-39m tablets twice daily for 7 days. On day 8, switch to one-515mtablet twice daily. 1 each 0   Baclofen 5 MG TABS Take 5 mg by mouth 3 (three) times daily as needed. (Patient not taking: Reported on 08/05/2020) 30 tablet 0   CARBOPLATIN IV Inject into the vein every 21 ( twenty-one) days.     DOCETAXEL IV Inject into the vein every 21 ( twenty-one) days.     FERREX 150 150 MG capsule Take 150 mg by mouth daily.     folic acid (FOLVITE) 1 MG tablet Take by mouth.     lidocaine-prilocaine  (EMLA) cream Apply a small amount to port a cath site and cover with plastic wrap 1 hour prior to infusion appointments 30 g 3   meloxicam (MOBIC) 15 MG tablet Take 15 mg by mouth daily as needed. (Patient not taking: Reported on 08/05/2020)     oxyCODONE (OXY IR/ROXICODONE) 5 MG immediate release tablet Take 1 tablet (5 mg total) by mouth every 6 (six) hours as needed for moderate pain or severe pain. (Patient not taking: Reported on 08/05/2020) 25 tablet 0   prochlorperazine (COMPAZINE) 10 MG tablet Take 1 tablet (10 mg total) by mouth every 6 (six) hours as needed (Nausea or vomiting). (Patient not taking: Reported on 08/05/2020) 30 tablet 1   sertraline (ZOLOFT) 50 MG tablet Take 50 mg by mouth daily.     spironolactone (ALDACTONE) 50 MG tablet Take 50 mg by mouth every morning.     trastuzumab in sodium chloride 0.9 % 250 mL Inject into the vein every 21 ( twenty-one) days.     vitamin B-12 (CYANOCOBALAMIN) 1000 MCG tablet Take 1 tablet (1,000 mcg total) by mouth daily. 90 tablet 1   No current facility-administered medications for this visit.    ALLERGIES:  No Known Allergies  PHYSICAL EXAM:  Performance status (ECOG): 0 - Asymptomatic  There were no vitals filed for this visit. Wt Readings from Last 3 Encounters:  08/05/20 132 lb (59.9 kg)  07/14/20 135 lb 6.4 oz (61.4 kg)  07/06/20 136 lb 1.6 oz (61.7 kg)   Physical Exam Vitals reviewed.  Constitutional:      Appearance: Normal appearance.  Cardiovascular:     Rate and Rhythm: Normal rate and regular rhythm.     Pulses: Normal pulses.     Heart sounds: Normal heart sounds.  Pulmonary:     Effort: Pulmonary effort is normal.     Breath sounds: Normal breath sounds.  Musculoskeletal:     Right lower leg: No edema.     Left lower leg: No edema.  Skin:    Comments: Darkened fingernails  Neurological:     General: No focal deficit present.     Mental Status: She is alert and oriented to person, place, and time.   Psychiatric:        Mood and Affect: Mood normal.        Behavior: Behavior normal.    LABORATORY DATA:  I have reviewed the labs as listed.  CBC Latest Ref Rng & Units 08/05/2020 07/14/2020 06/23/2020  WBC 4.0 - 10.5 K/uL 5.5 8.5 8.0  Hemoglobin 12.0 - 15.0 g/dL 10.5(L) 9.3(L) 12.5  Hematocrit 36.0 - 46.0 % 32.3(L) 28.4(L) 38.6  Platelets 150 - 400 K/uL 379 441(H) 368   CMP Latest Ref Rng & Units 08/05/2020 07/14/2020 06/23/2020  Glucose 70 - 99 mg/dL 123(H) 100(H) 101(H)  BUN 6 - 20 mg/dL _0 Creatinine 0.44 - 1.00 mg/dL 0.84 0.72 0.60  Sodium 135 - 145 mmol/L 136 136 136  Potassium 3.5 - 5.1 mmol/L 3.9 3.8 3.5  Chloride 98 - 111 mmol/L 101 105 103  CO2 22 - 32 mmol/L _1 Calcium 8.9 - 10.3 mg/dL 9.2 8.7(L) 9.1  Total Protein 6.5 - 8.1 g/dL 7.3 6.5 7.3  Total Bilirubin 0.3 - 1.2 mg/dL 0.6 0.5 0.6  Alkaline Phos 38 - 126 U/L 84 83 71  AST 15 - 41 U/L 39 24 21  ALT 0 - 44 U/L 32 25 15    DIAGNOSTIC IMAGING:  I have independently reviewed the scans and discussed with the patient. ECHOCARDIOGRAM COMPLETE  Result Date: 08/18/2020    ECHOCARDIOGRAM REPORT   Patient Name:   JARITZA DUIGNAN Date of Exam: 08/18/2020 Medical Rec #:  [id number]  Height:       60.0 in Accession #:    [id number] Weight:       132.0 lb Date of Birth:  1980-04-16  BSA:          1.564 m Patient Age:    51 years   BP:           122/82 mmHg Patient Gender: F          HR:           78 bpm. Exam Location:  [facility] Procedure: 2D Echo, Cardiac Doppler and Color Doppler Indications:    Chemo Z09  History:        Patient has prior history of Echocardiogram examinations, most                 recent 06/02/2020. Risk Factors:Hypertension. Breast cancer ([place])                 (From Hx).  Sonographer:    Alvino Chapel RCS Referring Phys: [phone] [place]  1. Left ventricular ejection fraction, by estimation, is 50 to 55%. The left ventricle has low normal function. The left ventricle demonstrates global  hypokinesis. Left ventricular diastolic parameters were normal. The average left ventricular global longitudinal strain is -16.8 %. The global longitudinal strain is normal.  2. Right ventricular systolic function is normal. The right ventricular size is normal. There is normal pulmonary artery systolic pressure.  3. The mitral valve is normal in structure. Trivial mitral valve regurgitation. No evidence of mitral stenosis.  4. The aortic valve has an indeterminant number of cusps. Aortic valve regurgitation is not visualized. No aortic stenosis is present. FINDINGS  Left Ventricle: Left ventricular ejection fraction, by estimation, is 50 to 55%. The left ventricle has low normal function. The left ventricle demonstrates global hypokinesis. The average left ventricular global longitudinal strain is -16.8 %. The global longitudinal strain is normal. The left ventricular internal cavity size was normal in size. There is no left ventricular hypertrophy. Left ventricular diastolic parameters were normal. Right Ventricle: The right ventricular size is normal. No increase in right ventricular wall thickness. Right ventricular systolic function is normal. There is normal  pulmonary artery systolic pressure. The tricuspid regurgitant velocity is 2.55 m/s, and  with an assumed right atrial pressure of 3 mmHg, the estimated right ventricular systolic pressure is 25.6 mmHg. Left Atrium: Left atrial size was normal in size. Right Atrium: Right atrial size was normal in size. Pericardium: There is no evidence of pericardial effusion. Mitral Valve: The mitral valve is normal in structure. Trivial mitral valve regurgitation. No evidence of mitral valve stenosis. Tricuspid Valve: The tricuspid valve is normal in structure. Tricuspid valve regurgitation is mild . No evidence of tricuspid stenosis. Aortic Valve: The aortic valve has an indeterminant number of cusps. Aortic valve regurgitation is not visualized. No aortic stenosis is  present. Aortic valve mean gradient measures 6.0 mmHg. Aortic valve peak gradient measures 12.5 mmHg. Aortic valve area, by VTI measures 1.68 cm. Pulmonic Valve: The pulmonic valve was not well visualized. Pulmonic valve regurgitation is not visualized. No evidence of pulmonic stenosis. Aorta: The aortic root is normal in size and structure. IAS/Shunts: No atrial level shunt detected by color flow Doppler.  LEFT VENTRICLE PLAX 2D LVIDd:         4.15 cm      Diastology LVIDs:         3.40 cm      LV e' medial:    11.90 cm/s LV PW:         0.90 cm      LV E/e' medial:  8.4 LV IVS:        0.90 cm      LV e' lateral:   15.80 cm/s LVOT diam:     1.90 cm      LV E/e' lateral: 6.3 LV SV:         56 LV SV Index:   36           2D Longitudinal Strain LVOT Area:     2.84 cm     2D Strain GLS Avg:     -16.8 %  LV Volumes (MOD) LV vol d, MOD A2C: 134.0 ml LV vol d, MOD A4C: 102.0 ml LV vol s, MOD A2C: 65.3 ml LV vol s, MOD A4C: 41.6 ml LV SV MOD A2C:     68.7 ml LV SV MOD A4C:     102.0 ml LV SV MOD BP:      66.8 ml RIGHT VENTRICLE RV S prime:     12.90 cm/s TAPSE (M-mode): 2.1 cm LEFT ATRIUM             Index       RIGHT ATRIUM           Index LA diam:        4.00 cm 2.56 cm/m  RA Area:     14.80 cm LA Vol (A2C):   44.8 ml 28.64 ml/m RA Volume:   37.30 ml  23.84 ml/m LA Vol (A4C):   46.5 ml 29.72 ml/m LA Biplane Vol: 47.3 ml 30.23 ml/m  AORTIC VALVE AV Area (Vmax):    1.68 cm AV Area (Vmean):   1.75 cm AV Area (VTI):     1.68 cm AV Vmax:           176.77 cm/s AV Vmean:          114.076 cm/s AV VTI:            0.333 m AV Peak Grad:      12.5 mmHg AV Mean Grad:      6.0 mmHg LVOT Vmax:  105.00 cm/s LVOT Vmean:        70.400 cm/s LVOT VTI:          0.197 m LVOT/AV VTI ratio: 0.59  AORTA Ao Root diam: 2.80 cm MITRAL VALVE                TRICUSPID VALVE MV Area (PHT): 3.63 cm     TR Peak grad:   26.0 mmHg MV Decel Time: 209 msec     TR Vmax:        255.00 cm/s MV E velocity: 100.00 cm/s MV A velocity: 63.30 cm/s    SHUNTS MV E/A ratio:  1.58         Systemic VTI:  0.20 m                             Systemic Diam: 1.90 cm Carlyle Dolly MD Electronically signed by Carlyle Dolly MD Signature Date/Time: 08/18/2020/10:52:19 AM    Final      ASSESSMENT:  1.  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  She had mammogram/ultrasound on 03/18/2020 done in [place] which showed a 1.4 x 0.9 x 1.5 cm mass in the upper outer quadrant of the right breast. -Biopsy on 03/31/2020 consistent with intermediate to high-grade infiltrating ductal carcinoma of the right breast at 10:30 position, HER-2 3+ positive, Ki-67 40%, ER weak staining in less than 10% of tumor cells, PR negative. -MRI of the breast on 04/20/2020 with 1.3 x 1.5 x 1.4 cm irregular enhancing mass within the upper outer right breast.  No abnormal appearing lymph nodes. - Right breast lumpectomy and SLNB on 05/20/2020- grade 3 IDC, 1.8 cm, invasive carcinoma is less than 1 mm from anterior margin focally and less than 1 mm from the posterior margin broadly.  Margins negative for in situ carcinoma.  Lymphovascular invasion present.  0/5 lymph nodes positive.  There is focal ductal carcinoma within the vascular space in the soft tissue adjacent to the lymph node.  No carcinoma is seen within the lymph nodes.  PT1CPN0. - Genetic testing was negative. - 6 cycles of [place] with 1 year of Herceptin recommended.  Cycle 1 of [place] started on 06/23/2020.   2.  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  Never smoker. -Maternal grandmother had colon cancer.  Maternal grandfather died of non-Hodgkin's lymphoma.  Paternal grandfather died of lung cancer.  Maternal uncle had multiple myeloma.  Mother had basal cell skin cancer.  3.  Left upper extremity DVT: - Doppler on 07/06/2020 positive for acute DVT in the left axillary,  peripheral subclavian vein and superficial thrombosis involving left basilic vein. - This is port induced and she was started on Eliquis.   PLAN:  1.  Stage I (T1 cN0 M0) right breast IDC, HER2 positive: - She completed 3 cycles of chemotherapy with [place]. - She is continuing to work.  She has mild neuropathy with tingling and numbness in the fingertips and toes, on and off. - She has diarrhea which is controlled with Imodium, lasts first week and half.  Nails have become darker in the bed. - LMP was on 07/07/2020. - She complained of vaginal dryness.  I have recommended lubricants like Replens, [place] jelly. - Reviewed her labs today which showed CBC was adequate for treatment.  LFTs including  bilirubin was normal.  Proceed with cycle 4 today without any dose modifications. - RTC 3 weeks for follow-up.   2.  High risk drug monitoring: - 2D echo on 08/18/2020 reviewed by me shows EF 50-55%. - Prior echo on 06/02/2020 with EF 55-60%.  There is a drop in 5% EF.  We will closely monitor. - No clinical signs or symptoms of PND or orthopnea.   3.  Normocytic anemia: - Continue iron tablet daily.  No major side effects.  Hemoglobin is stable around 10.1.  4.  Left upper extremity DVT: - Continue Eliquis twice daily.  No bleeding issues. - She still has some swelling of the left upper extremity with dilated veins.  I have recommended compression sleeve.  She is seeing lymphedema clinic next week and will discuss with them.   Orders placed this encounter:  No orders of the defined types were placed in this encounter.    Derek Jack, MD [place] [phone]   I, Thana Ates, am acting as a scribe for Dr. Derek Jack.  I, Derek Jack MD, have reviewed the above documentation for accuracy and completeness, and I agree with the above.

## 2020-08-26 ENCOUNTER — Inpatient Hospital Stay (HOSPITAL_COMMUNITY): Payer: PRIVATE HEALTH INSURANCE

## 2020-08-26 ENCOUNTER — Inpatient Hospital Stay (HOSPITAL_COMMUNITY): Payer: PRIVATE HEALTH INSURANCE | Attending: Hematology

## 2020-08-26 ENCOUNTER — Inpatient Hospital Stay (HOSPITAL_BASED_OUTPATIENT_CLINIC_OR_DEPARTMENT_OTHER): Payer: PRIVATE HEALTH INSURANCE | Admitting: Hematology

## 2020-08-26 ENCOUNTER — Other Ambulatory Visit: Payer: Self-pay

## 2020-08-26 VITALS — BP 127/70 | HR 80 | Temp 97.0°F | Resp 18 | Wt 134.9 lb

## 2020-08-26 VITALS — BP 110/71 | HR 84 | Temp 97.0°F | Resp 18

## 2020-08-26 DIAGNOSIS — Z5111 Encounter for antineoplastic chemotherapy: Secondary | ICD-10-CM | POA: Diagnosis present

## 2020-08-26 DIAGNOSIS — Z5189 Encounter for other specified aftercare: Secondary | ICD-10-CM | POA: Insufficient documentation

## 2020-08-26 DIAGNOSIS — C50411 Malignant neoplasm of upper-outer quadrant of right female breast: Secondary | ICD-10-CM

## 2020-08-26 DIAGNOSIS — Z5112 Encounter for antineoplastic immunotherapy: Secondary | ICD-10-CM | POA: Diagnosis present

## 2020-08-26 DIAGNOSIS — Z170 Estrogen receptor positive status [ER+]: Secondary | ICD-10-CM | POA: Insufficient documentation

## 2020-08-26 DIAGNOSIS — Z95828 Presence of other vascular implants and grafts: Secondary | ICD-10-CM

## 2020-08-26 DIAGNOSIS — Z79899 Other long term (current) drug therapy: Secondary | ICD-10-CM | POA: Diagnosis not present

## 2020-08-26 LAB — CBC WITH DIFFERENTIAL/PLATELET
Abs Immature Granulocytes: 0.05 10*3/uL (ref 0.00–0.07)
Basophils Absolute: 0.1 10*3/uL (ref 0.0–0.1)
Basophils Relative: 1 %
Eosinophils Absolute: 0 10*3/uL (ref 0.0–0.5)
Eosinophils Relative: 1 %
HCT: 31.3 % — ABNORMAL LOW (ref 36.0–46.0)
Hemoglobin: 10.1 g/dL — ABNORMAL LOW (ref 12.0–15.0)
Immature Granulocytes: 1 %
Lymphocytes Relative: 20 %
Lymphs Abs: 0.9 10*3/uL (ref 0.7–4.0)
MCH: 31.9 pg (ref 26.0–34.0)
MCHC: 32.3 g/dL (ref 30.0–36.0)
MCV: 98.7 fL (ref 80.0–100.0)
Monocytes Absolute: 0.8 10*3/uL (ref 0.1–1.0)
Monocytes Relative: 18 %
Neutro Abs: 2.7 10*3/uL (ref 1.7–7.7)
Neutrophils Relative %: 59 %
Platelets: 341 10*3/uL (ref 150–400)
RBC: 3.17 MIL/uL — ABNORMAL LOW (ref 3.87–5.11)
RDW: 19.9 % — ABNORMAL HIGH (ref 11.5–15.5)
WBC: 4.6 10*3/uL (ref 4.0–10.5)
nRBC: 0 % (ref 0.0–0.2)

## 2020-08-26 LAB — COMPREHENSIVE METABOLIC PANEL
ALT: 29 U/L (ref 0–44)
AST: 32 U/L (ref 15–41)
Albumin: 3.9 g/dL (ref 3.5–5.0)
Alkaline Phosphatase: 75 U/L (ref 38–126)
Anion gap: 8 (ref 5–15)
BUN: 14 mg/dL (ref 6–20)
CO2: 26 mmol/L (ref 22–32)
Calcium: 9.1 mg/dL (ref 8.9–10.3)
Chloride: 105 mmol/L (ref 98–111)
Creatinine, Ser: 0.66 mg/dL (ref 0.44–1.00)
GFR, Estimated: >60 mL/min (ref >60)
Glucose, Bld: 81 mg/dL (ref 70–99)
Potassium: 3.8 mmol/L (ref 3.5–5.1)
Sodium: 139 mmol/L (ref 135–145)
Total Bilirubin: 0.7 mg/dL (ref 0.3–1.2)
Total Protein: 7 g/dL (ref 6.5–8.1)

## 2020-08-26 LAB — MAGNESIUM: Magnesium: 1.7 mg/dL (ref 1.7–2.4)

## 2020-08-26 MED ORDER — SODIUM CHLORIDE 0.9% FLUSH
10.0000 mL | INTRAVENOUS | Status: DC | PRN
Start: 2020-08-26 — End: 2020-08-26
  Administered 2020-08-26: 10 mL

## 2020-08-26 MED ORDER — PALONOSETRON HCL INJECTION 0.25 MG/5ML
0.2500 mg | Freq: Once | INTRAVENOUS | Status: AC
  Administered 2020-08-26: 0.25 mg via INTRAVENOUS
  Filled 2020-08-26: qty 5

## 2020-08-26 MED ORDER — SODIUM CHLORIDE 0.9 % IV SOLN
10.0000 mg | Freq: Once | INTRAVENOUS | Status: AC
  Administered 2020-08-26: 10 mg via INTRAVENOUS
  Filled 2020-08-26: qty 10

## 2020-08-26 MED ORDER — TRASTUZUMAB-DKST CHEMO 150 MG IV SOLR
6.0000 mg/kg | Freq: Once | INTRAVENOUS | Status: AC
  Administered 2020-08-26: 378 mg via INTRAVENOUS
  Filled 2020-08-26: qty 18

## 2020-08-26 MED ORDER — HEPARIN SOD (PORK) LOCK FLUSH 100 UNIT/ML IV SOLN
500.0000 [IU] | Freq: Once | INTRAVENOUS | Status: AC | PRN
  Administered 2020-08-26: 500 [IU]

## 2020-08-26 MED ORDER — SODIUM CHLORIDE 0.9 % IV SOLN: Freq: Once | INTRAVENOUS | Status: AC

## 2020-08-26 MED ORDER — FOSAPREPITANT DIMEGLUMINE INJECTION 150 MG
150.0000 mg | Freq: Once | INTRAVENOUS | Status: AC
  Administered 2020-08-26: 150 mg via INTRAVENOUS
  Filled 2020-08-26: qty 150

## 2020-08-26 MED ORDER — SODIUM CHLORIDE 0.9 % IV SOLN
660.0000 mg | Freq: Once | INTRAVENOUS | Status: AC
  Administered 2020-08-26: 660 mg via INTRAVENOUS
  Filled 2020-08-26: qty 66

## 2020-08-26 MED ORDER — DIPHENHYDRAMINE HCL 25 MG PO CAPS
50.0000 mg | ORAL_CAPSULE | Freq: Once | ORAL | Status: AC
  Administered 2020-08-26: 50 mg via ORAL
  Filled 2020-08-26: qty 2

## 2020-08-26 MED ORDER — ACETAMINOPHEN 325 MG PO TABS
650.0000 mg | ORAL_TABLET | Freq: Once | ORAL | Status: AC
  Administered 2020-08-26: 650 mg via ORAL
  Filled 2020-08-26: qty 2

## 2020-08-26 MED ORDER — SODIUM CHLORIDE 0.9 % IV SOLN
75.0000 mg/m2 | Freq: Once | INTRAVENOUS | Status: AC
  Administered 2020-08-26: 120 mg via INTRAVENOUS
  Filled 2020-08-26: qty 12

## 2020-08-26 NOTE — Progress Notes (Signed)
Labs reviewed today at office visit. Will treat today per MD. No new changes reported by patient today.   Treatment given per orders. Patient tolerated it well without problems. Vitals stable and discharged home from clinic ambulatory. Follow up as scheduled.

## 2020-08-26 NOTE — Patient Instructions (Signed)
[facility]  Discharge Instructions: Thank you for choosing [facility] to provide your oncology and hematology care.  If you have a lab appointment with the Cancer Center, please come in thru the Main Entrance and check in at the main information desk.  Wear comfortable clothing and clothing appropriate for easy access to any Portacath or PICC line.   We strive to give you quality time with your provider. You may need to reschedule your appointment if you arrive late (15 or more minutes).  Arriving late affects you and other patients whose appointments are after yours.  Also, if you miss three or more appointments without notifying the office, you may be dismissed from the clinic at the provider's discretion.      For prescription refill requests, have your pharmacy contact our office and allow 72 hours for refills to be completed.        To help prevent nausea and vomiting after your treatment, we encourage you to take your nausea medication as directed.  BELOW ARE SYMPTOMS THAT SHOULD BE REPORTED IMMEDIATELY: *FEVER GREATER THAN 100.4 F (38 C) OR HIGHER *CHILLS OR SWEATING *NAUSEA AND VOMITING THAT IS NOT CONTROLLED WITH YOUR NAUSEA MEDICATION *UNUSUAL SHORTNESS OF BREATH *UNUSUAL BRUISING OR BLEEDING *URINARY PROBLEMS (pain or burning when urinating, or frequent urination) *BOWEL PROBLEMS (unusual diarrhea, constipation, pain near the anus) TENDERNESS IN MOUTH AND THROAT WITH OR WITHOUT PRESENCE OF ULCERS (sore throat, sores in mouth, or a toothache) UNUSUAL RASH, SWELLING OR PAIN  UNUSUAL VAGINAL DISCHARGE OR ITCHING   Items with * indicate a potential emergency and should be followed up as soon as possible or go to the Emergency Department if any problems should occur.  Please show the CHEMOTHERAPY ALERT CARD or IMMUNOTHERAPY ALERT CARD at check-in to the Emergency Department and triage nurse.  Should you have questions after your visit or need to cancel  or reschedule your appointment, please contact [facility] [phone]  and follow the prompts.  Office hours are 8:00 a.m. to 4:30 p.m. Monday - Friday. Please note that voicemails left after 4:00 p.m. may not be returned until the following business day.  We are closed weekends and major holidays. You have access to a nurse at all times for urgent questions. Please call the main number to the clinic [phone] and follow the prompts.  For any non-urgent questions, you may also contact your provider using MyChart. We now offer e-Visits for anyone 18 and older to request care online for non-urgent symptoms. For details visit [email or web address].   Also download the MyChart app! Go to the app store, search "MyChart", open the app, select [facility], and log in with your MyChart username and password.  Due to Covid, a mask is required upon entering the hospital/clinic. If you do not have a mask, one will be given to you upon arrival. For doctor visits, patients may have 1 support person aged 18 or older with them. For treatment visits, patients cannot have anyone with them due to current Covid guidelines and our immunocompromised population.  

## 2020-08-26 NOTE — Patient Instructions (Addendum)
[facility] at [facility] Discharge Instructions  You were seen today by Dr. Katragadda. He went over your recent results and scans, and you received your treatment. Dr. Katragadda will see you back in 3 weeks for labs and follow up.   Thank you for choosing [facility] at [facility] to provide your oncology and hematology care.  To afford each patient quality time with our provider, please arrive at least 15 minutes before your scheduled appointment time.   If you have a lab appointment with the Cancer Center please come in thru the Main Entrance and check in at the main information desk  You need to re-schedule your appointment should you arrive 10 or more minutes late.  We strive to give you quality time with our providers, and arriving late affects you and other patients whose appointments are after yours.  Also, if you no show three or more times for appointments you may be dismissed from the clinic at the providers discretion.     Again, thank you for choosing [facility].  Our hope is that these requests will decrease the amount of time that you wait before being seen by our physicians.       _____________________________________________________________  Should you have questions after your visit to [facility], please contact our office at [phone] between the hours of 8:00 a.m. and 4:30 p.m.  Voicemails left after 4:00 p.m. will not be returned until the following business day.  For prescription refill requests, have your pharmacy contact our office and allow 72 hours.    Cancer Center Support Programs:   > Cancer Support Group  2nd Tuesday of the month 1pm-2pm, Journey Room   

## 2020-08-26 NOTE — Progress Notes (Signed)
Patient has been assessed, vital signs and labs have been reviewed by Dr. Katragadda. ANC, Creatinine, LFTs, and Platelets are within treatment parameters per Dr. Katragadda. The patient is good to proceed with treatment at this time. Primary RN and pharmacy aware.  

## 2020-08-28 ENCOUNTER — Inpatient Hospital Stay (HOSPITAL_COMMUNITY): Payer: PRIVATE HEALTH INSURANCE

## 2020-08-28 ENCOUNTER — Other Ambulatory Visit: Payer: Self-pay

## 2020-08-28 VITALS — BP 130/70 | HR 80 | Temp 97.0°F | Resp 18

## 2020-08-28 DIAGNOSIS — Z95828 Presence of other vascular implants and grafts: Secondary | ICD-10-CM

## 2020-08-28 DIAGNOSIS — Z170 Estrogen receptor positive status [ER+]: Secondary | ICD-10-CM

## 2020-08-28 DIAGNOSIS — Z5112 Encounter for antineoplastic immunotherapy: Secondary | ICD-10-CM | POA: Diagnosis not present

## 2020-08-28 DIAGNOSIS — C50411 Malignant neoplasm of upper-outer quadrant of right female breast: Secondary | ICD-10-CM

## 2020-08-28 MED ORDER — PEGFILGRASTIM-BMEZ 6 MG/0.6ML ~~LOC~~ SOSY
6.0000 mg | PREFILLED_SYRINGE | Freq: Once | SUBCUTANEOUS | Status: AC
  Administered 2020-08-28: 6 mg via SUBCUTANEOUS
  Filled 2020-08-28: qty 0.6

## 2020-08-28 NOTE — Patient Instructions (Signed)
[facility]  Discharge Instructions: Thank you for choosing [facility] to provide your oncology and hematology care.  If you have a lab appointment with the Cancer Center, please come in thru the Main Entrance and check in at the main information desk.  Wear comfortable clothing and clothing appropriate for easy access to any Portacath or PICC line.   We strive to give you quality time with your provider. You may need to reschedule your appointment if you arrive late (15 or more minutes).  Arriving late affects you and other patients whose appointments are after yours.  Also, if you miss three or more appointments without notifying the office, you may be dismissed from the clinic at the provider's discretion.      For prescription refill requests, have your pharmacy contact our office and allow 72 hours for refills to be completed.        To help prevent nausea and vomiting after your treatment, we encourage you to take your nausea medication as directed.  BELOW ARE SYMPTOMS THAT SHOULD BE REPORTED IMMEDIATELY: *FEVER GREATER THAN 100.4 F (38 C) OR HIGHER *CHILLS OR SWEATING *NAUSEA AND VOMITING THAT IS NOT CONTROLLED WITH YOUR NAUSEA MEDICATION *UNUSUAL SHORTNESS OF BREATH *UNUSUAL BRUISING OR BLEEDING *URINARY PROBLEMS (pain or burning when urinating, or frequent urination) *BOWEL PROBLEMS (unusual diarrhea, constipation, pain near the anus) TENDERNESS IN MOUTH AND THROAT WITH OR WITHOUT PRESENCE OF ULCERS (sore throat, sores in mouth, or a toothache) UNUSUAL RASH, SWELLING OR PAIN  UNUSUAL VAGINAL DISCHARGE OR ITCHING   Items with * indicate a potential emergency and should be followed up as soon as possible or go to the Emergency Department if any problems should occur.  Please show the CHEMOTHERAPY ALERT CARD or IMMUNOTHERAPY ALERT CARD at check-in to the Emergency Department and triage nurse.  Should you have questions after your visit or need to cancel  or reschedule your appointment, please contact [facility] [phone]  and follow the prompts.  Office hours are 8:00 a.m. to 4:30 p.m. Monday - Friday. Please note that voicemails left after 4:00 p.m. may not be returned until the following business day.  We are closed weekends and major holidays. You have access to a nurse at all times for urgent questions. Please call the main number to the clinic [phone] and follow the prompts.  For any non-urgent questions, you may also contact your provider using MyChart. We now offer e-Visits for anyone 18 and older to request care online for non-urgent symptoms. For details visit [email or web address].   Also download the MyChart app! Go to the app store, search "MyChart", open the app, select [facility], and log in with your MyChart username and password.  Due to Covid, a mask is required upon entering the hospital/clinic. If you do not have a mask, one will be given to you upon arrival. For doctor visits, patients may have 1 support person aged 18 or older with them. For treatment visits, patients cannot have anyone with them due to current Covid guidelines and our immunocompromised population.  

## 2020-08-28 NOTE — Progress Notes (Signed)
Patient tolerated Ziextenzo injection with no complaints voiced.  Site clean and dry with no bruising or swelling noted.  No complaints of pain.  Discharged with vital signs stable and no signs or symptoms of distress noted.

## 2020-08-31 ENCOUNTER — Other Ambulatory Visit: Payer: Self-pay

## 2020-08-31 ENCOUNTER — Ambulatory Visit: Payer: PRIVATE HEALTH INSURANCE | Attending: Surgery

## 2020-08-31 DIAGNOSIS — C50411 Malignant neoplasm of upper-outer quadrant of right female breast: Secondary | ICD-10-CM | POA: Insufficient documentation

## 2020-08-31 DIAGNOSIS — Z483 Aftercare following surgery for neoplasm: Secondary | ICD-10-CM | POA: Insufficient documentation

## 2020-08-31 DIAGNOSIS — Z170 Estrogen receptor positive status [ER+]: Secondary | ICD-10-CM | POA: Insufficient documentation

## 2020-08-31 DIAGNOSIS — I890 Lymphedema, not elsewhere classified: Secondary | ICD-10-CM | POA: Insufficient documentation

## 2020-08-31 DIAGNOSIS — R293 Abnormal posture: Secondary | ICD-10-CM | POA: Insufficient documentation

## 2020-08-31 NOTE — Therapy (Signed)
[place], [place], [address] Phone: [phone]   Fax:  [phone]  Physical Therapy Treatment  Patient Details  Name: Lauren Ortega MRN: [id number] Date of Birth: 12/27/1980 Referring Provider (PT): Dr. Ninfa Linden   Encounter Date: 08/31/2020   PT End of Session - 08/31/20 0839     Visit Number 2   # unchanged due to screen only   PT Start Time 0830    PT Stop Time 0840    PT Time Calculation (min) 10 min    Activity Tolerance Patient tolerated treatment well    Behavior During Therapy [facility] for tasks assessed/performed             Past Medical History:  Diagnosis Date   Anemia    Anxiety    Breast cancer ([place]) 03/2020   Family history of colon cancer    Family history of lymphoma    Family history of pancreatic cancer    Family history of skin cancer    Hypertension    Port-A-Cath in place 06/16/2020    Past Surgical History:  Procedure Laterality Date   BREAST LUMPECTOMY WITH RADIOACTIVE SEED AND SENTINEL LYMPH NODE BIOPSY Right 05/20/2020   Procedure: RIGHT BREAST LUMPECTOMY WITH RADIOACTIVE SEED AND SENTINEL LYMPH NODE BIOPSY;  Surgeon: Coralie Keens, MD;  Location: [place];  Service: General;  Laterality: Right;   PORTACATH PLACEMENT Left 05/20/2020   Procedure: INSERTION PORT-A-CATH;  Surgeon: Coralie Keens, MD;  Location: [facility];  Service: General;  Laterality: Left;   TONSILLECTOMY     TUBAL LIGATION      There were no vitals filed for this visit.   Subjective Assessment - 08/31/20 0831     Subjective I was diagnosed with 5 clots in my Lt upper arm so my arm has been swelling but we know its from that. My Rt arm (cancer side) though seems to be doing great.    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 Ninfa Linden, HTN, DDD lumbar spine,                     L-DEX FLOWSHEETS - 08/31/20 0800       L-DEX LYMPHEDEMA SCREENING   Measurement Type Unilateral    L-DEX MEASUREMENT EXTREMITY Upper Extremity    POSITION  Standing    DOMINANT SIDE Right    At Risk Side Right    BASELINE SCORE (UNILATERAL) 0.1    L-DEX SCORE (UNILATERAL) -18.6    VALUE CHANGE (UNILAT) -18.7                                    PT Long Term Goals - 06/11/20 0847       PT LONG TERM GOAL #1   Title Pt will return to baseline AROM    Status Achieved      PT LONG TERM GOAL #2   Title Pt will be scheduled for post op SOZO surveillance to detect subclinical lymphedema    Status Achieved                   Plan - 08/31/20 0840     Clinical Impression Statement Pt returns for her 3 month L-Dex screen. Her change from baseline is a larger # than usual of -18.7  due to the increased swelling she has been experiencing from multiple blood clots in her baseline arm (Lt UE). Explained this to pt and that the fact that her Rt arm is so far from baseline that it is assumed she does not have subclinical lymphedema at this time. No treatment required at this time except to cont every 3 month L-Dex screens, unless she notices a problem sooner, which pt is agreeable to.    PT Next Visit Plan Cont every 3 month L-Dex screens for up to 2 years from her SLNB.    Consulted and Agree with Plan of Care Patient             Patient will benefit from skilled therapeutic intervention in order to improve the following deficits and impairments:     Visit Diagnosis: Aftercare following surgery for neoplasm     Problem List Patient Active Problem List   Diagnosis Date Noted   Port-A-Cath in place 06/16/2020   Genetic testing 05/12/2020   Family history of pancreatic cancer    Family history of skin cancer    Family history of lymphoma    Family history of colon cancer    Malignant neoplasm of upper-outer quadrant of right  female breast ([place]) 04/16/2020    Otelia Limes, PTA 08/31/2020, 8:42 AM  [place], [place], [address] Phone: [phone]   Fax:  [phone]  Name: Lauren Ortega MRN: [id number] Date of Birth: [birth year]

## 2020-09-03 ENCOUNTER — Other Ambulatory Visit (HOSPITAL_COMMUNITY): Payer: Self-pay | Admitting: Hematology

## 2020-09-03 ENCOUNTER — Telehealth (HOSPITAL_COMMUNITY): Payer: Self-pay | Admitting: *Deleted

## 2020-09-03 MED ORDER — DIPHENOXYLATE-ATROPINE 2.5-0.025 MG PO TABS: 2.0000 | ORAL_TABLET | Freq: Four times a day (QID) | ORAL | 3 refills | Status: DC | PRN

## 2020-09-03 NOTE — Telephone Encounter (Signed)
Patient called stating that since her treatment last week she is experiencing 6 diarrhea stools periodically in a 3 hour period and increased abdominal pain.  Has taken imodium without relief.

## 2020-09-03 NOTE — Telephone Encounter (Signed)
Patient notified and verbalized understanding. 

## 2020-09-09 ENCOUNTER — Encounter (HOSPITAL_COMMUNITY): Payer: Self-pay | Admitting: Hematology

## 2020-09-09 ENCOUNTER — Encounter (HOSPITAL_COMMUNITY): Payer: Self-pay

## 2020-09-09 ENCOUNTER — Other Ambulatory Visit (HOSPITAL_COMMUNITY): Payer: Self-pay | Admitting: *Deleted

## 2020-09-09 DIAGNOSIS — I82622 Acute embolism and thrombosis of deep veins of left upper extremity: Secondary | ICD-10-CM

## 2020-09-10 ENCOUNTER — Other Ambulatory Visit: Payer: Self-pay

## 2020-09-10 ENCOUNTER — Ambulatory Visit (HOSPITAL_COMMUNITY)
Admission: RE | Admit: 2020-09-10 | Discharge: 2020-09-10 | Disposition: A | Discharge status: Home / Self Care | Payer: PRIVATE HEALTH INSURANCE | Source: Ambulatory Visit | Attending: Hematology | Admitting: Hematology

## 2020-09-10 DIAGNOSIS — I82622 Acute embolism and thrombosis of deep veins of left upper extremity: Secondary | ICD-10-CM | POA: Diagnosis not present

## 2020-09-15 NOTE — Progress Notes (Signed)
[address] [place], [place] [id number]   CLINIC:  Medical Oncology/Hematology  PCP:  [name], [address] / [place] [address] [phone]   REASON FOR VISIT:  Follow-up for right breast cancer  PRIOR THERAPY: none  NGS Results: Her2+  CURRENT THERAPY: Docetaxel + Carboplatin + Trastuzumab (TCH) q21d / Trastuzumab q21d  BRIEF ONCOLOGIC HISTORY:  Oncology History  Malignant neoplasm of upper-outer quadrant of right female breast ([place])  04/16/2020 Initial Diagnosis   Malignant neoplasm of upper-outer quadrant of right female breast [facility])    Genetic Testing   Negative genetic testing. No pathogenic variants identified on the [facility] Multi-Cancer Panel+RNA. The report date is 05/10/2020.  The Multi-Cancer Panel + RNA offered by [facility] includes sequencing and/or deletion duplication testing of the following 84 genes: AIP, ALK, APC, ATM, AXIN2,BAP1,  BARD1, BLM, BMPR1A, BRCA1, BRCA2, BRIP1, CASR, CDC73, CDH1, CDK4, CDKN1B, CDKN1C, CDKN2A (p14ARF), CDKN2A (p16INK4a), CEBPA, CHEK2, CTNNA1, DICER1, DIS3L2, EGFR (c.2369C>T, p.Thr790Met variant only), EPCAM (Deletion/duplication testing only), FH, FLCN, GATA2, GPC3, GREM1 (Promoter region deletion/duplication testing only), HOXB13 (c.251G>A, p.Gly84Glu), HRAS, KIT, MAX, MEN1, MET, MITF (c.952G>A, p.Glu318Lys variant only), MLH1, MSH2, MSH3, MSH6, MUTYH, NBN, NF1, NF2, NTHL1, PALB2, PDGFRA, PHOX2B, PMS2, POLD1, POLE, POT1, PRKAR1A, PTCH1, PTEN, RAD50, RAD51C, RAD51D, RB1, RECQL4, RET, RUNX1, SDHAF2, SDHA (sequence changes only), SDHB, SDHC, SDHD, SMAD4, SMARCA4, SMARCB1, SMARCE1, STK11, SUFU, TERC, TERT, TMEM127, TP53, TSC1, TSC2, VHL, WRN and WT1.   06/23/2020 -  Chemotherapy    Patient is on Treatment Plan: BREAST DOCETAXEL + CARBOPLATIN + TRASTUZUMAB ([place]) Q21D / TRASTUZUMAB Q21D         CANCER STAGING: Cancer Staging Malignant neoplasm of upper-outer quadrant of right female breast  ([place]) Staging form: Breast, AJCC 8th Edition - Clinical stage from 04/21/2020: Stage IA (cT1c, cN0, cM0, G3, ER+, PR-, HER2+) - Unsigned   INTERVAL HISTORY:  Ms. Ineze Ortega, a 40 y.o. female, returns for routine follow-up and consideration for next cycle of chemotherapy. Pina was last seen on 08/26/20.  Due for cycle #5 of Docetaxel + Carboplatin + Trastuzumab today.   Overall, she tells me she has been feeling pretty well. Her left arm is swollen. She reports diarrhea helped by lomotil, fatigue, disturbed sleep, but she denies loss of appetite. She reports pain in her left left leg and foot as well as a bruise on the top and bottom of her left foot. Following treatment she reports occasional tingling and numbness in her left leg and foot for 10 days. She reports worsened SOB even without exertion, and she reports bilateral ankle swelling occasionally at night accompanied by tightness and pain. She reports pain and tightness under her right breast starting a few weeks ago. She denies orthopnea.   Overall, she feels ready for next cycle of chemo today.   REVIEW OF SYSTEMS:  Review of Systems  Constitutional:  Positive for fatigue (75%). Negative for appetite change (75%).  Respiratory:  Positive for shortness of breath.   Cardiovascular:  Positive for chest pain (R breast) and leg swelling (ankles).  Gastrointestinal:  Positive for diarrhea.  Musculoskeletal:  Positive for myalgias (2/10-8/10 constant L leg; body aches at night).  Neurological:  Positive for numbness (w/ treatment; lasts 10 days).  Hematological:  Bruises/bleeds easily (L foot top and bottom).  Psychiatric/Behavioral:  Positive for sleep disturbance.   All other systems reviewed and are negative.  PAST MEDICAL/SURGICAL HISTORY:  Past Medical History:  Diagnosis Date  Anemia    Anxiety    Breast cancer ([place]) 03/2020   Family history of colon cancer    Family history of lymphoma    Family history of pancreatic cancer     Family history of skin cancer    Hypertension    Port-A-Cath in place 06/16/2020   Past Surgical History:  Procedure Laterality Date   BREAST LUMPECTOMY WITH RADIOACTIVE SEED AND SENTINEL LYMPH NODE BIOPSY Right 05/20/2020   Procedure: RIGHT BREAST LUMPECTOMY WITH RADIOACTIVE SEED AND SENTINEL LYMPH NODE BIOPSY;  Surgeon: Coralie Keens, MD;  Location: [facility];  Service: General;  Laterality: Right;   PORTACATH PLACEMENT Left 05/20/2020   Procedure: INSERTION PORT-A-CATH;  Surgeon: Coralie Keens, MD;  Location: [facility];  Service: General;  Laterality: Left;   TONSILLECTOMY     TUBAL LIGATION      SOCIAL HISTORY:  Social History   Socioeconomic History   Marital status: Divorced    Spouse name: Not on file   Number of children: Not on file   Years of education: Not on file   Highest education level: Not on file  Occupational History   Not on file  Tobacco Use   Smoking status: Never   Smokeless tobacco: Never  Substance and Sexual Activity   Alcohol use: Yes    Comment: occassionally   Drug use: Never   Sexual activity: Yes    Birth control/protection: Surgical  Other Topics Concern   Not on file  Social History Narrative   Not on file   Social Determinants of Health   Financial Resource Strain: Low Risk    Difficulty of Paying Living Expenses: Not hard at all  Food Insecurity: No Food Insecurity   Worried About Charity fundraiser in the Last Year: Never true   Arboriculturist in the Last Year: Never true  Transportation Needs: No Transportation Needs   Lack of Transportation (Medical): No   Lack of Transportation (Non-Medical): No  Physical Activity: Insufficiently Active   Days of Exercise per Week: 2 days   Minutes of Exercise per Session: 20 min  Stress: No Stress Concern Present   Feeling of Stress : Not at all  Social Connections: Moderately Integrated   Frequency of Communication with Friends and Family: More  than three times a week   Frequency of Social Gatherings with Friends and Family: More than three times a week   Attends Religious Services: 1 to 4 times per year   Active Member of [employer] or Organizations: No   Attends Music therapist: 1 to 4 times per year   Marital Status: Divorced  Human resources officer Violence: Not At Risk   Fear of Current or Ex-Partner: No   Emotionally Abused: No   Physically Abused: No   Sexually Abused: No    FAMILY HISTORY:  Family History  Problem Relation Age of Onset   Basal cell carcinoma Mother 28   Bladder Cancer Maternal Uncle 72   Lymphoma Maternal Uncle 65       [place]s    Pancreatic cancer Maternal Grandmother    Non-Hodgkin's lymphoma Maternal Grandfather    Lung cancer Paternal Grandfather    Colon cancer Other        x8 or 9   Colon cancer Maternal Great-grandfather     CURRENT MEDICATIONS:  Current Outpatient Medications  Medication Sig Dispense Refill   amLODipine (NORVASC) 10 MG tablet Take 10 mg by mouth daily.  apixaban (ELIQUIS) 5 MG TABS tablet Take 1 tablet (5 mg total) by mouth 2 (two) times daily. 60 tablet 3   Baclofen 5 MG TABS Take 5 mg by mouth 3 (three) times daily as needed. 30 tablet 0   CARBOPLATIN IV Inject into the vein every 21 ( twenty-one) days.     diphenoxylate-atropine (LOMOTIL) 2.5-0.025 MG tablet Take 2 tablets by mouth 4 (four) times daily as needed for diarrhea or loose stools. 30 tablet 3   DOCETAXEL IV Inject into the vein every 21 ( twenty-one) days.     FERREX 150 150 MG capsule Take 150 mg by mouth daily.     folic acid (FOLVITE) 1 MG tablet Take by mouth.     lidocaine-prilocaine (EMLA) cream Apply a small amount to port a cath site and cover with plastic wrap 1 hour prior to infusion appointments 30 g 3   meloxicam (MOBIC) 15 MG tablet Take 15 mg by mouth daily as needed.     oxyCODONE (OXY IR/ROXICODONE) 5 MG immediate release tablet Take 1 tablet (5 mg total) by mouth every 6  (six) hours as needed for moderate pain or severe pain. 25 tablet 0   prochlorperazine (COMPAZINE) 10 MG tablet Take 1 tablet (10 mg total) by mouth every 6 (six) hours as needed (Nausea or vomiting). 30 tablet 1   sertraline (ZOLOFT) 50 MG tablet Take 50 mg by mouth daily.     spironolactone (ALDACTONE) 50 MG tablet Take 50 mg by mouth every morning.     trastuzumab in sodium chloride 0.9 % 250 mL Inject into the vein every 21 ( twenty-one) days.     vitamin B-12 (CYANOCOBALAMIN) 1000 MCG tablet Take 1 tablet (1,000 mcg total) by mouth daily. 90 tablet 1   No current facility-administered medications for this visit.    ALLERGIES:  No Known Allergies  PHYSICAL EXAM:  Performance status (ECOG): 0 - Asymptomatic  There were no vitals filed for this visit. Wt Readings from Last 3 Encounters:  08/26/20 134 lb 14.4 oz (61.2 kg)  08/05/20 132 lb (59.9 kg)  07/14/20 135 lb 6.4 oz (61.4 kg)   Physical Exam Vitals reviewed.  Constitutional:      Appearance: Normal appearance.     Comments: Port-a-cath on left  Cardiovascular:     Rate and Rhythm: Normal rate and regular rhythm.     Pulses: Normal pulses.     Heart sounds: Normal heart sounds.  Pulmonary:     Effort: Pulmonary effort is normal.     Breath sounds: Normal breath sounds.  Chest:  Breasts:    Right: Tenderness present. No inverted nipple or nipple discharge.  Lymphadenopathy:     Upper Body:     Right upper body: No supraclavicular adenopathy.     Left upper body: No supraclavicular adenopathy.  Neurological:     General: No focal deficit present.     Mental Status: She is alert and oriented to person, place, and time.  Psychiatric:        Mood and Affect: Mood normal.        Behavior: Behavior normal.    LABORATORY DATA:  I have reviewed the labs as listed.  CBC Latest Ref Rng & Units 08/26/2020 08/05/2020 07/14/2020  WBC 4.0 - 10.5 K/uL 4.6 5.5 8.5  Hemoglobin 12.0 - 15.0 g/dL 10.1(L) 10.5(L) 9.3(L)  Hematocrit  36.0 - 46.0 % 31.3(L) 32.3(L) 28.4(L)  Platelets 150 - 400 K/uL 341 379 441(H)   CMP Latest  Ref Rng & Units 08/26/2020 08/05/2020 07/14/2020  Glucose 70 - 99 mg/dL 81 123(H) 100(H)  BUN 6 - 20 mg/dL '14 12 14  ' Creatinine 0.44 - 1.00 mg/dL 0.66 0.84 0.72  Sodium 135 - 145 mmol/L 139 136 136  Potassium 3.5 - 5.1 mmol/L 3.8 3.9 3.8  Chloride 98 - 111 mmol/L 105 101 105  CO2 22 - 32 mmol/L '26 26 25  ' Calcium 8.9 - 10.3 mg/dL 9.1 9.2 8.7(L)  Total Protein 6.5 - 8.1 g/dL 7.0 7.3 6.5  Total Bilirubin 0.3 - 1.2 mg/dL 0.7 0.6 0.5  Alkaline Phos 38 - 126 U/L 75 84 83  AST 15 - 41 U/L 32 39 24  ALT 0 - 44 U/L 29 32 25    DIAGNOSTIC IMAGING:  I have independently reviewed the scans and discussed with the patient. US Venous Img Upper Uni Left  Result Date: 09/10/2020 CLINICAL DATA:  LEFT upper extremity swelling EXAM: LEFT UPPER EXTREMITY VENOUS DOPPLER ULTRASOUND TECHNIQUE: Gray-scale sonography with graded compression, as well as color Doppler and duplex ultrasound were performed to evaluate the upper extremity deep venous system from the level of the subclavian vein and including the jugular, axillary, basilic, radial, ulnar and upper cephalic vein. Spectral Doppler was utilized to evaluate flow at rest and with distal augmentation maneuvers. COMPARISON:  Upper extremity venous duplex, 08/05/2020. Chest radiograph, 05/20/2020. FINDINGS: Contralateral Subclavian Vein: Respiratory phasicity is normal and symmetric with the symptomatic side. No evidence of thrombus. Normal compressibility. Internal Jugular Vein: No evidence of thrombus. Normal compressibility, respiratory phasicity and response to augmentation. Subclavian Vein: Interval removal of LEFT subclavian port catheter. No evidence of thrombus. Normal compressibility, respiratory phasicity and response to augmentation. Axillary Vein: No evidence of thrombus. Normal compressibility, respiratory phasicity and response to augmentation. Cephalic Vein: No  evidence of thrombus. Normal compressibility, respiratory phasicity and response to augmentation. Basilic Vein: No evidence of thrombus. Normal compressibility, respiratory phasicity and response to augmentation. Brachial Veins: No evidence of thrombus. Normal compressibility, respiratory phasicity and response to augmentation. Radial Veins: No evidence of thrombus. Normal compressibility, respiratory phasicity and response to augmentation. Ulnar Veins: No evidence of thrombus. Normal compressibility, respiratory phasicity and response to augmentation. Other Findings:  The contralateral RIGHT subclavian vein is patent. No evidence of superficial thrombophlebitis or abnormal fluid collection. IMPRESSION: 1. Interval removal of subclavian port catheter, with resolution of previously-demonstrated of LEFT axillary, subclavian and basilic venous thrombi. 2. No evidence of DVT within the LEFT upper extremity on today's evaluation. Michaelle Birks, MD [facility] Radiology Electronically Signed   By: Michaelle Birks M.D.   On: 09/10/2020 12:12   ECHOCARDIOGRAM COMPLETE  Result Date: 08/18/2020    ECHOCARDIOGRAM REPORT   Patient Name:   Lauren Ortega Date of Exam: 08/18/2020 Medical Rec #:  [id number]  Height:       60.0 in Accession #:    [id number] Weight:       132.0 lb Date of Birth:  12/03/1980  BSA:          1.564 m Patient Age:    40 years   BP:           122/82 mmHg Patient Gender: F          HR:           78 bpm. Exam Location:  [facility] Procedure: 2D Echo, Cardiac Doppler and Color Doppler Indications:    Chemo Z09  History:  Patient has prior history of Echocardiogram examinations, most                 recent 06/02/2020. Risk Factors:Hypertension. Breast cancer ([place])                 (From Hx).  Sonographer:    Alvino Chapel RCS Referring Phys: [id number] [place]  1. Left ventricular ejection fraction, by estimation, is 50 to 55%. The left  ventricle has low normal function. The left ventricle demonstrates global hypokinesis. Left ventricular diastolic parameters were normal. The average left ventricular global longitudinal strain is -16.8 %. The global longitudinal strain is normal.  2. Right ventricular systolic function is normal. The right ventricular size is normal. There is normal pulmonary artery systolic pressure.  3. The mitral valve is normal in structure. Trivial mitral valve regurgitation. No evidence of mitral stenosis.  4. The aortic valve has an indeterminant number of cusps. Aortic valve regurgitation is not visualized. No aortic stenosis is present. FINDINGS  Left Ventricle: Left ventricular ejection fraction, by estimation, is 50 to 55%. The left ventricle has low normal function. The left ventricle demonstrates global hypokinesis. The average left ventricular global longitudinal strain is -16.8 %. The global longitudinal strain is normal. The left ventricular internal cavity size was normal in size. There is no left ventricular hypertrophy. Left ventricular diastolic parameters were normal. Right Ventricle: The right ventricular size is normal. No increase in right ventricular wall thickness. Right ventricular systolic function is normal. There is normal pulmonary artery systolic pressure. The tricuspid regurgitant velocity is 2.55 m/s, and  with an assumed right atrial pressure of 3 mmHg, the estimated right ventricular systolic pressure is 88.4 mmHg. Left Atrium: Left atrial size was normal in size. Right Atrium: Right atrial size was normal in size. Pericardium: There is no evidence of pericardial effusion. Mitral Valve: The mitral valve is normal in structure. Trivial mitral valve regurgitation. No evidence of mitral valve stenosis. Tricuspid Valve: The tricuspid valve is normal in structure. Tricuspid valve regurgitation is mild . No evidence of tricuspid stenosis. Aortic Valve: The aortic valve has an indeterminant number of  cusps. Aortic valve regurgitation is not visualized. No aortic stenosis is present. Aortic valve mean gradient measures 6.0 mmHg. Aortic valve peak gradient measures 12.5 mmHg. Aortic valve area, by VTI measures 1.68 cm. Pulmonic Valve: The pulmonic valve was not well visualized. Pulmonic valve regurgitation is not visualized. No evidence of pulmonic stenosis. Aorta: The aortic root is normal in size and structure. IAS/Shunts: No atrial level shunt detected by color flow Doppler.  LEFT VENTRICLE PLAX 2D LVIDd:         4.15 cm      Diastology LVIDs:         3.40 cm      LV e' medial:    11.90 cm/s LV PW:         0.90 cm      LV E/e' medial:  8.4 LV IVS:        0.90 cm      LV e' lateral:   15.80 cm/s LVOT diam:     1.90 cm      LV E/e' lateral: 6.3 LV SV:         56 LV SV Index:   36           2D Longitudinal Strain LVOT Area:     2.84 cm     2D Strain GLS Avg:     -  16.8 %  LV Volumes (MOD) LV vol d, MOD A2C: 134.0 ml LV vol d, MOD A4C: 102.0 ml LV vol s, MOD A2C: 65.3 ml LV vol s, MOD A4C: 41.6 ml LV SV MOD A2C:     68.7 ml LV SV MOD A4C:     102.0 ml LV SV MOD BP:      66.8 ml RIGHT VENTRICLE RV S prime:     12.90 cm/s TAPSE (M-mode): 2.1 cm LEFT ATRIUM             Index       RIGHT ATRIUM           Index LA diam:        4.00 cm 2.56 cm/m  RA Area:     14.80 cm LA Vol (A2C):   44.8 ml 28.64 ml/m RA Volume:   37.30 ml  23.84 ml/m LA Vol (A4C):   46.5 ml 29.72 ml/m LA Biplane Vol: 47.3 ml 30.23 ml/m  AORTIC VALVE AV Area (Vmax):    1.68 cm AV Area (Vmean):   1.75 cm AV Area (VTI):     1.68 cm AV Vmax:           176.77 cm/s AV Vmean:          114.076 cm/s AV VTI:            0.333 m AV Peak Grad:      12.5 mmHg AV Mean Grad:      6.0 mmHg LVOT Vmax:         105.00 cm/s LVOT Vmean:        70.400 cm/s LVOT VTI:          0.197 m LVOT/AV VTI ratio: 0.59  AORTA Ao Root diam: 2.80 cm MITRAL VALVE                TRICUSPID VALVE MV Area (PHT): 3.63 cm     TR Peak grad:   26.0 mmHg MV Decel Time: 209 msec     TR  Vmax:        255.00 cm/s MV E velocity: 100.00 cm/s MV A velocity: 63.30 cm/s   SHUNTS MV E/A ratio:  1.58         Systemic VTI:  0.20 m                             Systemic Diam: 1.90 cm Carlyle Dolly MD Electronically signed by Carlyle Dolly MD Signature Date/Time: 08/18/2020/10:52:19 AM    Final      ASSESSMENT:  1.  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  She had mammogram/ultrasound on 03/18/2020 done in [place] which showed a 1.4 x 0.9 x 1.5 cm mass in the upper outer quadrant of the right breast. -Biopsy on 03/31/2020 consistent with intermediate to high-grade infiltrating ductal carcinoma of the right breast at 10:30 position, HER-2 3+ positive, Ki-67 40%, ER weak staining in less than 10% of tumor cells, PR negative. -MRI of the breast on 04/20/2020 with 1.3 x 1.5 x 1.4 cm irregular enhancing mass within the upper outer right breast.  No abnormal appearing lymph nodes. - Right breast lumpectomy and SLNB on 05/20/2020- grade 3 IDC, 1.8 cm, invasive carcinoma is less than 1 mm from anterior margin focally and less than 1 mm from the posterior margin broadly.  Margins negative for in situ carcinoma.  Lymphovascular invasion present.  0/5 lymph nodes positive.  There is focal ductal carcinoma within the vascular space in the soft tissue adjacent to the lymph node.  No carcinoma is seen within the lymph nodes.  PT1CPN0. - Genetic testing was negative. - 6 cycles of [place] with 1 year of Herceptin recommended.  Cycle 1 of [place] started on 06/23/2020.   2.  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  Never smoker. -Maternal grandmother had colon cancer.  Maternal grandfather died of non-Hodgkin's lymphoma.  Paternal grandfather died of lung cancer.  Maternal uncle had multiple myeloma.  Mother had basal cell skin cancer.  3.  Left upper extremity  DVT: - Doppler on 07/06/2020 positive for acute DVT in the left axillary, peripheral subclavian vein and superficial thrombosis involving left basilic vein. - This is port induced and she was started on Eliquis.   PLAN:  1.  Stage I (T1 cN0 M0) right breast IDC, HER2 positive: - She has completed 4 cycles of TCH. - She reports diarrhea is well controlled with Lomotil. - She reported tingling or numbness in the periphery of the feet on and off for the first 10 days.  Occasionally hurt and feel tight.  Left is more than right. - Recommend decreasing docetaxel dose by 20% due to neuropathy. - Reviewed labs from today which showed normal white count and platelet count.  LFTs were normal.  Magnesium was low at 1.5.  She will receive 2 g of IV magnesium. - RTC 3 weeks for follow-up.   2.  High risk drug monitoring: - 2D echo on 08/18/2020 reviewed by me shows EF 50-55%. - Prior echo on 06/02/2020 with EF 55-60%. - No clinical signs of PND or orthopnea.   3.  Normocytic anemia: - Continue iron tablet daily.  Hemoglobin is stable around 10.2.  4.  Left upper extremity DVT: - She is taking Eliquis twice daily. - She still noticed some swelling in the left upper extremity with tightness and dilated veins. - We have repeated left upper extremity Doppler on 09/10/2020.  The report says no evidence of DVT in the left upper extremity.  However they reported that there is interval removal of subclavian port catheter whereas the catheter is still present.  Resolution of left axillary, subclavian and basilic vein thrombi reported.  We will ask for revision of the radiology report. - I will make a referral to physical therapy.   Orders placed this encounter:  No orders of the defined types were placed in this encounter.    Derek Jack, MD [place] [phone]   I, Thana Ates, am acting as a scribe for Dr. Derek Jack.  I, Derek Jack MD, have reviewed the  above documentation for accuracy and completeness, and I agree with the above.

## 2020-09-16 ENCOUNTER — Ambulatory Visit (HOSPITAL_COMMUNITY): Payer: PRIVATE HEALTH INSURANCE | Admitting: Hematology

## 2020-09-16 ENCOUNTER — Encounter (HOSPITAL_COMMUNITY): Payer: Self-pay | Admitting: Hematology

## 2020-09-16 ENCOUNTER — Other Ambulatory Visit: Payer: Self-pay

## 2020-09-16 ENCOUNTER — Other Ambulatory Visit (HOSPITAL_COMMUNITY): Payer: Self-pay | Admitting: *Deleted

## 2020-09-16 ENCOUNTER — Inpatient Hospital Stay (HOSPITAL_BASED_OUTPATIENT_CLINIC_OR_DEPARTMENT_OTHER): Payer: PRIVATE HEALTH INSURANCE | Admitting: Hematology

## 2020-09-16 ENCOUNTER — Inpatient Hospital Stay (HOSPITAL_COMMUNITY): Payer: PRIVATE HEALTH INSURANCE

## 2020-09-16 ENCOUNTER — Encounter (HOSPITAL_COMMUNITY): Payer: Self-pay

## 2020-09-16 ENCOUNTER — Other Ambulatory Visit (HOSPITAL_COMMUNITY): Payer: PRIVATE HEALTH INSURANCE

## 2020-09-16 VITALS — BP 107/65 | HR 76 | Temp 96.9°F | Resp 18 | Wt 132.4 lb

## 2020-09-16 VITALS — BP 111/57 | HR 77 | Temp 96.7°F | Resp 18

## 2020-09-16 DIAGNOSIS — Z170 Estrogen receptor positive status [ER+]: Secondary | ICD-10-CM

## 2020-09-16 DIAGNOSIS — Z95828 Presence of other vascular implants and grafts: Secondary | ICD-10-CM

## 2020-09-16 DIAGNOSIS — C50411 Malignant neoplasm of upper-outer quadrant of right female breast: Secondary | ICD-10-CM

## 2020-09-16 DIAGNOSIS — Z5112 Encounter for antineoplastic immunotherapy: Secondary | ICD-10-CM | POA: Diagnosis not present

## 2020-09-16 LAB — COMPREHENSIVE METABOLIC PANEL
ALT: 25 U/L (ref 0–44)
AST: 29 U/L (ref 15–41)
Albumin: 3.9 g/dL (ref 3.5–5.0)
Alkaline Phosphatase: 67 U/L (ref 38–126)
Anion gap: 8 (ref 5–15)
BUN: 15 mg/dL (ref 6–20)
CO2: 25 mmol/L (ref 22–32)
Calcium: 8.9 mg/dL (ref 8.9–10.3)
Chloride: 104 mmol/L (ref 98–111)
Creatinine, Ser: 0.71 mg/dL (ref 0.44–1.00)
GFR, Estimated: >60 mL/min (ref >60)
Glucose, Bld: 123 mg/dL — ABNORMAL HIGH (ref 70–99)
Potassium: 4.1 mmol/L (ref 3.5–5.1)
Sodium: 137 mmol/L (ref 135–145)
Total Bilirubin: 0.3 mg/dL (ref 0.3–1.2)
Total Protein: 7.1 g/dL (ref 6.5–8.1)

## 2020-09-16 LAB — CBC WITH DIFFERENTIAL/PLATELET
Abs Immature Granulocytes: 0.03 10*3/uL (ref 0.00–0.07)
Basophils Absolute: 0 10*3/uL (ref 0.0–0.1)
Basophils Relative: 1 %
Eosinophils Absolute: 0 10*3/uL (ref 0.0–0.5)
Eosinophils Relative: 1 %
HCT: 30.8 % — ABNORMAL LOW (ref 36.0–46.0)
Hemoglobin: 10.2 g/dL — ABNORMAL LOW (ref 12.0–15.0)
Immature Granulocytes: 1 %
Lymphocytes Relative: 19 %
Lymphs Abs: 0.9 10*3/uL (ref 0.7–4.0)
MCH: 33.6 pg (ref 26.0–34.0)
MCHC: 33.1 g/dL (ref 30.0–36.0)
MCV: 101.3 fL — ABNORMAL HIGH (ref 80.0–100.0)
Monocytes Absolute: 0.8 10*3/uL (ref 0.1–1.0)
Monocytes Relative: 16 %
Neutro Abs: 3.1 10*3/uL (ref 1.7–7.7)
Neutrophils Relative %: 62 %
Platelets: 194 10*3/uL (ref 150–400)
RBC: 3.04 MIL/uL — ABNORMAL LOW (ref 3.87–5.11)
RDW: 20 % — ABNORMAL HIGH (ref 11.5–15.5)
WBC: 4.9 10*3/uL (ref 4.0–10.5)
nRBC: 0 % (ref 0.0–0.2)

## 2020-09-16 LAB — MAGNESIUM: Magnesium: 1.5 mg/dL — ABNORMAL LOW (ref 1.7–2.4)

## 2020-09-16 MED ORDER — ACETAMINOPHEN 325 MG PO TABS
650.0000 mg | ORAL_TABLET | Freq: Once | ORAL | Status: AC
  Administered 2020-09-16: 650 mg via ORAL
  Filled 2020-09-16: qty 2

## 2020-09-16 MED ORDER — SODIUM CHLORIDE 0.9 % IV SOLN: Freq: Once | INTRAVENOUS | Status: AC

## 2020-09-16 MED ORDER — PALONOSETRON HCL INJECTION 0.25 MG/5ML
0.2500 mg | Freq: Once | INTRAVENOUS | Status: AC
  Administered 2020-09-16: 0.25 mg via INTRAVENOUS
  Filled 2020-09-16: qty 5

## 2020-09-16 MED ORDER — SODIUM CHLORIDE 0.9 % IV SOLN
150.0000 mg | Freq: Once | INTRAVENOUS | Status: AC
  Administered 2020-09-16: 150 mg via INTRAVENOUS
  Filled 2020-09-16: qty 150

## 2020-09-16 MED ORDER — HEPARIN SOD (PORK) LOCK FLUSH 100 UNIT/ML IV SOLN
500.0000 [IU] | Freq: Once | INTRAVENOUS | Status: AC | PRN
  Administered 2020-09-16: 500 [IU]

## 2020-09-16 MED ORDER — SODIUM CHLORIDE 0.9 % IV SOLN
10.0000 mg | Freq: Once | INTRAVENOUS | Status: AC
  Administered 2020-09-16: 10 mg via INTRAVENOUS
  Filled 2020-09-16: qty 10

## 2020-09-16 MED ORDER — MAGNESIUM SULFATE 2 GM/50ML IV SOLN
2.0000 g | Freq: Once | INTRAVENOUS | Status: AC
  Administered 2020-09-16: 2 g via INTRAVENOUS
  Filled 2020-09-16: qty 50

## 2020-09-16 MED ORDER — TRASTUZUMAB-DKST CHEMO 150 MG IV SOLR
6.0000 mg/kg | Freq: Once | INTRAVENOUS | Status: AC
  Administered 2020-09-16: 378 mg via INTRAVENOUS
  Filled 2020-09-16: qty 18

## 2020-09-16 MED ORDER — SODIUM CHLORIDE 0.9 % IV SOLN
60.0000 mg/m2 | Freq: Once | INTRAVENOUS | Status: AC
  Administered 2020-09-16: 100 mg via INTRAVENOUS
  Filled 2020-09-16: qty 10

## 2020-09-16 MED ORDER — SODIUM CHLORIDE 0.9% FLUSH
10.0000 mL | INTRAVENOUS | Status: DC | PRN
  Administered 2020-09-16: 10 mL

## 2020-09-16 MED ORDER — SODIUM CHLORIDE 0.9 % IV SOLN
688.2000 mg | Freq: Once | INTRAVENOUS | Status: AC
  Administered 2020-09-16: 690 mg via INTRAVENOUS
  Filled 2020-09-16: qty 69

## 2020-09-16 MED ORDER — DIPHENHYDRAMINE HCL 25 MG PO CAPS
50.0000 mg | ORAL_CAPSULE | Freq: Once | ORAL | Status: AC
  Administered 2020-09-16: 50 mg via ORAL
  Filled 2020-09-16: qty 2

## 2020-09-16 MED ORDER — TEMAZEPAM 15 MG PO CAPS: 15.0000 mg | ORAL_CAPSULE | Freq: Every evening | ORAL | 0 refills | Status: DC | PRN

## 2020-09-16 NOTE — Progress Notes (Signed)
Patient has been assessed, vital signs and labs have been reviewed by Dr. Delton Coombes. ANC, Creatinine, LFTs, and Platelets are within treatment parameters per Dr. Delton Coombes. The patient is good to proceed with treatment at this time.  Mag 2 gm IV x 1.  Decrease Taxol by 20 %. Primary RN and pharmacy aware.

## 2020-09-16 NOTE — Patient Instructions (Addendum)
[facility] Discharge Instructions  You were seen today by Dr. Katragadda. He went over your recent results, and you received your treatment. Dr. Katragadda will see you back in 3 weeks for labs and follow up.   Thank you for choosing [facility] at [facility] to provide your oncology and hematology care.  To afford each patient quality time with our provider, please arrive at least 15 minutes before your scheduled appointment time.   If you have a lab appointment with the Cancer Center please come in thru the Main Entrance and check in at the main information desk  You need to re-schedule your appointment should you arrive 10 or more minutes late.  We strive to give you quality time with our providers, and arriving late affects you and other patients whose appointments are after yours.  Also, if you no show three or more times for appointments you may be dismissed from the clinic at the providers discretion.     Again, thank you for choosing [facility].  Our hope is that these requests will decrease the amount of time that you wait before being seen by our physicians.       _____________________________________________________________  Should you have questions after your visit to  Cancer Center, please contact our office at [phone] between the hours of 8:00 a.m. and 4:30 p.m.  Voicemails left after 4:00 p.m. will not be returned until the following business day.  For prescription refill requests, have your pharmacy contact our office and allow 72 hours.    Cancer Center Support Programs:   > Cancer Support Group  2nd Tuesday of the month 1pm-2pm, Journey Room   

## 2020-09-16 NOTE — Patient Instructions (Signed)
[facility]  Discharge Instructions: Thank you for choosing [facility] to provide your oncology and hematology care.  If you have a lab appointment with the [facility], please come in thru the Main Entrance and check in at the main information desk.  Wear comfortable clothing and clothing appropriate for easy access to any Portacath or PICC line.   We strive to give you quality time with your provider. You may need to reschedule your appointment if you arrive late (15 or more minutes).  Arriving late affects you and other patients whose appointments are after yours.  Also, if you miss three or more appointments without notifying the office, you may be dismissed from the clinic at the provider's discretion.      For prescription refill requests, have your pharmacy contact our office and allow 72 hours for refills to be completed.    Today you received the following chemotherapy and/or immunotherapy agents taxotere, herceptin, carboplatin      To help prevent nausea and vomiting after your treatment, we encourage you to take your nausea medication as directed.  BELOW ARE SYMPTOMS THAT SHOULD BE REPORTED IMMEDIATELY: *FEVER GREATER THAN 100.4 F (38 C) OR HIGHER *CHILLS OR SWEATING *NAUSEA AND VOMITING THAT IS NOT CONTROLLED WITH YOUR NAUSEA MEDICATION *UNUSUAL SHORTNESS OF BREATH *UNUSUAL BRUISING OR BLEEDING *URINARY PROBLEMS (pain or burning when urinating, or frequent urination) *BOWEL PROBLEMS (unusual diarrhea, constipation, pain near the anus) TENDERNESS IN MOUTH AND THROAT WITH OR WITHOUT PRESENCE OF ULCERS (sore throat, sores in mouth, or a toothache) UNUSUAL RASH, SWELLING OR PAIN  UNUSUAL VAGINAL DISCHARGE OR ITCHING   Items with * indicate a potential emergency and should be followed up as soon as possible or go to the Emergency Department if any problems should occur.  Please show the CHEMOTHERAPY ALERT CARD or IMMUNOTHERAPY ALERT CARD at check-in  to the Emergency Department and triage nurse.  Should you have questions after your visit or need to cancel or reschedule your appointment, please contact [facility] [phone]  and follow the prompts.  Office hours are 8:00 a.m. to 4:30 p.m. Monday - Friday. Please note that voicemails left after 4:00 p.m. may not be returned until the following business day.  We are closed weekends and major holidays. You have access to a nurse at all times for urgent questions. Please call the main number to the clinic [phone] and follow the prompts.  For any non-urgent questions, you may also contact your provider using MyChart. We now offer e-Visits for anyone 17 and older to request care online for non-urgent symptoms. For details visit [email or web address].   Also download the MyChart app! Go to the app store, search "MyChart", open the app, select [facility], and log in with your MyChart username and password.  Due to Covid, a mask is required upon entering the hospital/clinic. If you do not have a mask, one will be given to you upon arrival. For doctor visits, patients may have 1 support person aged 17 or older with them. For treatment visits, patients cannot have anyone with them due to current Covid guidelines and our immunocompromised population.   Docetaxel injection What is this medication? DOCETAXEL (doe se TAX el) is a chemotherapy drug. It targets fast dividing cells, like cancer cells, and causes these cells to die. This medicine is used to treat many types of cancers like breast cancer, certain stomach cancers,head and neck cancer, lung cancer, and prostate cancer. This medicine may  be used for other purposes; ask your health care provider orpharmacist if you have questions. COMMON BRAND NAME(S): Docefrez, Taxotere What should I tell my care team before I take this medication? They need to know if you have any of these conditions: infection (especially a virus infection  such as chickenpox, cold sores, or herpes) liver disease low blood counts, like low white cell, platelet, or red cell counts an unusual or allergic reaction to docetaxel, polysorbate 80, other chemotherapy agents, other medicines, foods, dyes, or preservatives pregnant or trying to get pregnant breast-feeding How should I use this medication? This drug is given as an infusion into a vein. It is administered in a hospitalor clinic by a specially trained health care professional. Talk to your pediatrician regarding the use of this medicine in children.Special care may be needed. Overdosage: If you think you have taken too much of this medicine contact apoison control center or emergency room at once. NOTE: This medicine is only for you. Do not share this medicine with others. What if I miss a dose? It is important not to miss your dose. Call your doctor or health careprofessional if you are unable to keep an appointment. What may interact with this medication? Do not take this medicine with any of the following medications: live virus vaccines This medicine may also interact with the following medications: aprepitant certain antibiotics like erythromycin or clarithromycin certain antivirals for HIV or hepatitis certain medicines for fungal infections like fluconazole, itraconazole, ketoconazole, posaconazole, or voriconazole cimetidine ciprofloxacin conivaptan cyclosporine dronedarone fluvoxamine grapefruit juice imatinib verapamil This list may not describe all possible interactions. Give your health care provider a list of all the medicines, herbs, non-prescription drugs, or dietary supplements you use. Also tell them if you smoke, drink alcohol, or use illegaldrugs. Some items may interact with your medicine. What should I watch for while using this medication? Your condition will be monitored carefully while you are receiving this medicine. You will need important blood work done  while you are taking thismedicine. Call your doctor or health care professional for advice if you get a fever, chills or sore throat, or other symptoms of a cold or flu. Do not treat yourself. This drug decreases your body's ability to fight infections. Try toavoid being around people who are sick. Some products may contain alcohol. Ask your health care professional if this medicine contains alcohol. Be sure to tell all health care professionals you are taking this medicine. Certain medicines, like metronidazole and disulfiram, can cause an unpleasant reaction when taken with alcohol. The reaction includes flushing, headache, nausea, vomiting, sweating, and increased thirst. Thereaction can last from 30 minutes to several hours. You may get drowsy or dizzy. Do not drive, use machinery, or do anything that needs mental alertness until you know how this medicine affects you. Do not stand or sit up quickly, especially if you are an older patient. This reduces the risk of dizzy or fainting spells. Alcohol may interfere with the effect ofthis medicine. Talk to your health care professional about your risk of cancer. You may bemore at risk for certain types of cancer if you take this medicine. Do not become pregnant while taking this medicine or for 6 months after stopping it. Women should inform their doctor if they wish to become pregnant or think they might be pregnant. There is a potential for serious side effects to an unborn child. Talk to your health care professional or pharmacist for more information. Do not breast-feed an infant  while taking this medicine orfor 1 week after stopping it. Males who get this medicine must use a condom during sex with females who can get pregnant. If you get a woman pregnant, the baby could have birth defects. The baby could die before they are born. You will need to continue wearing a condom for 3 months after stopping the medicine. Tell your health care providerright away if  your partner becomes pregnant while you are taking this medicine. This may interfere with the ability to father a child. You should talk to yourdoctor or health care professional if you are concerned about your fertility. What side effects may I notice from receiving this medication? Side effects that you should report to your doctor or health care professionalas soon as possible: allergic reactions like skin rash, itching or hives, swelling of the face, lips, or tongue blurred vision breathing problems changes in vision low blood counts - This drug may decrease the number of white blood cells, red blood cells and platelets. You may be at increased risk for infections and bleeding. nausea and vomiting pain, redness or irritation at site where injected pain, tingling, numbness in the hands or feet redness, blistering, peeling, or loosening of the skin, including inside the mouth signs of decreased platelets or bleeding - bruising, pinpoint red spots on the skin, black, tarry stools, nosebleeds signs of decreased red blood cells - unusually weak or tired, fainting spells, lightheadedness signs of infection - fever or chills, cough, sore throat, pain or difficulty passing urine swelling of the ankle, feet, hands Side effects that usually do not require medical attention (report to yourdoctor or health care professional if they continue or are bothersome): constipation diarrhea fingernail or toenail changes hair loss loss of appetite mouth sores muscle pain This list may not describe all possible side effects. Call your doctor for medical advice about side effects. You may report side effects to FDA at1-800-FDA-1088. Where should I keep my medication? This drug is given in a hospital or clinic and will not be stored at home. NOTE: This sheet is a summary. It may not cover all possible information. If you have questions about this medicine, talk to your doctor, pharmacist, orhealth care  provider.  2022 Elsevier/Gold Standard (2018-12-10 19:50:31)   Carboplatin injection What is this medication? CARBOPLATIN (KAR boe pla tin) is a chemotherapy drug. It targets fast dividing cells, like cancer cells, and causes these cells to die. This medicine is usedto treat ovarian cancer and many other cancers. This medicine may be used for other purposes; ask your health care provider orpharmacist if you have questions. COMMON BRAND NAME(S): Paraplatin What should I tell my care team before I take this medication? They need to know if you have any of these conditions: blood disorders hearing problems kidney disease recent or ongoing radiation therapy an unusual or allergic reaction to carboplatin, cisplatin, other chemotherapy, other medicines, foods, dyes, or preservatives pregnant or trying to get pregnant breast-feeding How should I use this medication? This drug is usually given as an infusion into a vein. It is administered in [facility] or clinic by a specially trained health care professional. Talk to your pediatrician regarding the use of this medicine in children.Special care may be needed. Overdosage: If you think you have taken too much of this medicine contact apoison control center or emergency room at once. NOTE: This medicine is only for you. Do not share this medicine with others. What if I miss a dose? It is important not  to miss a dose. Call your doctor or health careprofessional if you are unable to keep an appointment. What may interact with this medication? medicines for seizures medicines to increase blood counts like filgrastim, pegfilgrastim, sargramostim some antibiotics like amikacin, gentamicin, neomycin, streptomycin, tobramycin vaccines Talk to your doctor or health care professional before taking any of thesemedicines: acetaminophen aspirin ibuprofen ketoprofen naproxen This list may not describe all possible interactions. Give your health care  provider a list of all the medicines, herbs, non-prescription drugs, or dietary supplements you use. Also tell them if you smoke, drink alcohol, or use illegaldrugs. Some items may interact with your medicine. What should I watch for while using this medication? Your condition will be monitored carefully while you are receiving this medicine. You will need important blood work done while you are taking thismedicine. This drug may make you feel generally unwell. This is not uncommon, as chemotherapy can affect healthy cells as well as cancer cells. Report any side effects. Continue your course of treatment even though you feel ill unless yourdoctor tells you to stop. In some cases, you may be given additional medicines to help with side effects.Follow all directions for their use. Call your doctor or health care professional for advice if you get a fever, chills or sore throat, or other symptoms of a cold or flu. Do not treat yourself. This drug decreases your body's ability to fight infections. Try toavoid being around people who are sick. This medicine may increase your risk to bruise or bleed. Call your doctor orhealth care professional if you notice any unusual bleeding. Be careful brushing and flossing your teeth or using a toothpick because you may get an infection or bleed more easily. If you have any dental work done,tell your dentist you are receiving this medicine. Avoid taking products that contain aspirin, acetaminophen, ibuprofen, naproxen, or ketoprofen unless instructed by your doctor. These medicines may hide afever. Do not become pregnant while taking this medicine. Women should inform their doctor if they wish to become pregnant or think they might be pregnant. There is a potential for serious side effects to an unborn child. Talk to your health care professional or pharmacist for more information. Do not breast-feed aninfant while taking this medicine. What side effects may I notice from  receiving this medication? Side effects that you should report to your doctor or health care professionalas soon as possible: allergic reactions like skin rash, itching or hives, swelling of the face, lips, or tongue signs of infection - fever or chills, cough, sore throat, pain or difficulty passing urine signs of decreased platelets or bleeding - bruising, pinpoint red spots on the skin, black, tarry stools, nosebleeds signs of decreased red blood cells - unusually weak or tired, fainting spells, lightheadedness breathing problems changes in hearing changes in vision chest pain high blood pressure low blood counts - This drug may decrease the number of white blood cells, red blood cells and platelets. You may be at increased risk for infections and bleeding. nausea and vomiting pain, swelling, redness or irritation at the injection site pain, tingling, numbness in the hands or feet problems with balance, talking, walking trouble passing urine or change in the amount of urine Side effects that usually do not require medical attention (report to yourdoctor or health care professional if they continue or are bothersome): hair loss loss of appetite metallic taste in the mouth or changes in taste This list may not describe all possible side effects. Call your doctor for  medical advice about side effects. You may report side effects to FDA at1-800-FDA-1088. Where should I keep my medication? This drug is given in a hospital or clinic and will not be stored at home. NOTE: This sheet is a summary. It may not cover all possible information. If you have questions about this medicine, talk to your doctor, pharmacist, orhealth care provider.  2022 Elsevier/Gold Standard (2007-04-17 14:38:05)   Trastuzumab injection for infusion What is this medication? TRASTUZUMAB (tras TOO zoo mab) is a monoclonal antibody. It is used to treatbreast cancer and stomach cancer. This medicine may be used for  other purposes; ask your health care provider orpharmacist if you have questions. COMMON BRAND NAME(S): Herceptin, Janae Bridgeman, Ontruzant, Trazimera What should I tell my care team before I take this medication? They need to know if you have any of these conditions: heart disease heart failure lung or breathing disease, like asthma an unusual or allergic reaction to trastuzumab, benzyl alcohol, or other medications, foods, dyes, or preservatives pregnant or trying to get pregnant breast-feeding How should I use this medication? This drug is given as an infusion into a vein. It is administered in a hospitalor clinic by a specially trained health care professional. Talk to your pediatrician regarding the use of this medicine in children. Thismedicine is not approved for use in children. Overdosage: If you think you have taken too much of this medicine contact apoison control center or emergency room at once. NOTE: This medicine is only for you. Do not share this medicine with others. What if I miss a dose? It is important not to miss a dose. Call your doctor or health careprofessional if you are unable to keep an appointment. What may interact with this medication? This medicine may interact with the following medications: certain types of chemotherapy, such as daunorubicin, doxorubicin, epirubicin, and idarubicin This list may not describe all possible interactions. Give your health care provider a list of all the medicines, herbs, non-prescription drugs, or dietary supplements you use. Also tell them if you smoke, drink alcohol, or use illegaldrugs. Some items may interact with your medicine. What should I watch for while using this medication? Visit your doctor for checks on your progress. Report any side effects. Continue your course of treatment even though you feel ill unless your doctortells you to stop. Call your doctor or health care professional for advice if you get a  fever, chills or sore throat, or other symptoms of a cold or flu. Do not treatyourself. Try to avoid being around people who are sick. You may experience fever, chills and shaking during your first infusion. These effects are usually mild and can be treated with other medicines. Report any side effects during the infusion to your health care professional. Fever andchills usually do not happen with later infusions. Do not become pregnant while taking this medicine or for 7 months after stopping it. Women should inform their doctor if they wish to become pregnant or think they might be pregnant. Women of child-bearing potential will need to have a negative pregnancy test before starting this medicine. There is a potential for serious side effects to an unborn child. Talk to your health care professional or pharmacist for more information. Do not breast-feed an infantwhile taking this medicine or for 7 months after stopping it. Women must use effective birth control with this medicine. What side effects may I notice from receiving this medication? Side effects that you should report to your doctor or health  care professionalas soon as possible: allergic reactions like skin rash, itching or hives, swelling of the face, lips, or tongue chest pain or palpitations cough dizziness feeling faint or lightheaded, falls fever general ill feeling or flu-like symptoms signs of worsening heart failure like breathing problems; swelling in your legs and feet unusually weak or tired Side effects that usually do not require medical attention (report to yourdoctor or health care professional if they continue or are bothersome): bone pain changes in taste diarrhea joint pain nausea/vomiting weight loss This list may not describe all possible side effects. Call your doctor for medical advice about side effects. You may report side effects to FDA at1-800-FDA-1088. Where should I keep my medication? This drug is  given in a hospital or clinic and will not be stored at home. NOTE: This sheet is a summary. It may not cover all possible information. If you have questions about this medicine, talk to your doctor, pharmacist, orhealth care provider.  2022 Elsevier/Gold Standard (2016-01-05 14:37:52)

## 2020-09-16 NOTE — Progress Notes (Signed)
Pt here for D1C5 of ogiviri, taxotere, and carboplatin.  Magnesium 1.5 today.  Vital signs WNL for treatment today.  Still having left arm swelling since diagnosis with blood clot.  Good for treatment today. Mag 2 gm IV x 1- Decreased Taxol by 20 %.  Pt wanted something to help her sleep at night, Dr Raliegh Ip sent in Restoril 15 mg.  Tolerated treatment well today without incidence.  Vital signs stable prior to discharge.  Stable during and after treatment.  AVS reviewed.  Discharged in stable condition ambulatory.

## 2020-09-17 ENCOUNTER — Encounter (HOSPITAL_COMMUNITY): Payer: Self-pay | Admitting: Hematology

## 2020-09-18 ENCOUNTER — Inpatient Hospital Stay (HOSPITAL_COMMUNITY): Payer: PRIVATE HEALTH INSURANCE

## 2020-09-18 ENCOUNTER — Other Ambulatory Visit: Payer: Self-pay

## 2020-09-18 ENCOUNTER — Encounter (HOSPITAL_COMMUNITY): Payer: Self-pay

## 2020-09-18 VITALS — BP 121/66 | HR 73 | Temp 98.2°F | Resp 18

## 2020-09-18 DIAGNOSIS — Z170 Estrogen receptor positive status [ER+]: Secondary | ICD-10-CM

## 2020-09-18 DIAGNOSIS — Z5112 Encounter for antineoplastic immunotherapy: Secondary | ICD-10-CM | POA: Diagnosis not present

## 2020-09-18 DIAGNOSIS — Z95828 Presence of other vascular implants and grafts: Secondary | ICD-10-CM

## 2020-09-18 MED ORDER — PEGFILGRASTIM-BMEZ 6 MG/0.6ML ~~LOC~~ SOSY
6.0000 mg | PREFILLED_SYRINGE | Freq: Once | SUBCUTANEOUS | Status: AC
  Administered 2020-09-18: 6 mg via SUBCUTANEOUS
  Filled 2020-09-18: qty 0.6

## 2020-09-18 NOTE — Patient Instructions (Signed)
[facility]  Discharge Instructions: Thank you for choosing [facility] to provide your oncology and hematology care.  If you have a lab appointment with the [facility], please come in thru the Main Entrance and check in at the main information desk.  Wear comfortable clothing and clothing appropriate for easy access to any Portacath or PICC line.   We strive to give you quality time with your provider. You may need to reschedule your appointment if you arrive late (15 or more minutes).  Arriving late affects you and other patients whose appointments are after yours.  Also, if you miss three or more appointments without notifying the office, you may be dismissed from the clinic at the provider's discretion.      For prescription refill requests, have your pharmacy contact our office and allow 72 hours for refills to be completed.    Today you received zelstenzo.     To help prevent nausea and vomiting after your treatment, we encourage you to take your nausea medication as directed.  BELOW ARE SYMPTOMS THAT SHOULD BE REPORTED IMMEDIATELY: *FEVER GREATER THAN 100.4 F (38 C) OR HIGHER *CHILLS OR SWEATING *NAUSEA AND VOMITING THAT IS NOT CONTROLLED WITH YOUR NAUSEA MEDICATION *UNUSUAL SHORTNESS OF BREATH *UNUSUAL BRUISING OR BLEEDING *URINARY PROBLEMS (pain or burning when urinating, or frequent urination) *BOWEL PROBLEMS (unusual diarrhea, constipation, pain near the anus) TENDERNESS IN MOUTH AND THROAT WITH OR WITHOUT PRESENCE OF ULCERS (sore throat, sores in mouth, or a toothache) UNUSUAL RASH, SWELLING OR PAIN  UNUSUAL VAGINAL DISCHARGE OR ITCHING   Items with * indicate a potential emergency and should be followed up as soon as possible or go to the Emergency Department if any problems should occur.  Please show the CHEMOTHERAPY ALERT CARD or IMMUNOTHERAPY ALERT CARD at check-in to the Emergency Department and triage nurse.  Should you have questions  after your visit or need to cancel or reschedule your appointment, please contact [facility] [phone]  and follow the prompts.  Office hours are 8:00 a.m. to 4:30 p.m. Monday - Friday. Please note that voicemails left after 4:00 p.m. may not be returned until the following business day.  We are closed weekends and major holidays. You have access to a nurse at all times for urgent questions. Please call the main number to the clinic [phone] and follow the prompts.  For any non-urgent questions, you may also contact your provider using MyChart. We now offer e-Visits for anyone 39 and older to request care online for non-urgent symptoms. For details visit [email or web address].   Also download the MyChart app! Go to the app store, search "MyChart", open the app, select [facility], and log in with your MyChart username and password.  Due to Covid, a mask is required upon entering the hospital/clinic. If you do not have a mask, one will be given to you upon arrival. For doctor visits, patients may have 1 support person aged 42 or older with them. For treatment visits, patients cannot have anyone with them due to current Covid guidelines and our immunocompromised population.

## 2020-09-18 NOTE — Progress Notes (Signed)
Patient tolerated injection with no complaints voiced.  Site clean and dry with no bruising or swelling noted at site.  See MAR for details.  Band aid applied.  Patient stable during and after injection.  Vss with discharge and left in satisfactory condition with no s/s of distress noted.  

## 2020-09-21 ENCOUNTER — Other Ambulatory Visit: Payer: Self-pay

## 2020-09-21 ENCOUNTER — Ambulatory Visit: Payer: PRIVATE HEALTH INSURANCE

## 2020-09-21 DIAGNOSIS — R293 Abnormal posture: Secondary | ICD-10-CM

## 2020-09-21 DIAGNOSIS — I890 Lymphedema, not elsewhere classified: Secondary | ICD-10-CM | POA: Diagnosis present

## 2020-09-21 DIAGNOSIS — Z170 Estrogen receptor positive status [ER+]: Secondary | ICD-10-CM | POA: Diagnosis present

## 2020-09-21 DIAGNOSIS — C50411 Malignant neoplasm of upper-outer quadrant of right female breast: Secondary | ICD-10-CM

## 2020-09-21 DIAGNOSIS — Z483 Aftercare following surgery for neoplasm: Secondary | ICD-10-CM | POA: Diagnosis present

## 2020-09-21 NOTE — Therapy (Signed)
[place], [place], [address] Phone: [phone]   Fax:  [phone]  Physical Therapy Evaluation  Patient Details  Name: Lauren Ortega MRN: [id number] Date of Birth: 02-02-1980 Referring Provider (PT): Dr. Ninfa Linden   Encounter Date: 09/21/2020   PT End of Session - 09/21/20 1223     Visit Number 1    Number of Visits 18    Date for PT Re-Evaluation 11/02/20    PT Start Time 1005    PT Stop Time 1045    PT Time Calculation (min) 40 min    Activity Tolerance Patient tolerated treatment well    Behavior During Therapy [facility] for tasks assessed/performed             Past Medical History:  Diagnosis Date   Anemia    Anxiety    Breast cancer ([place]) 03/2020   Family history of colon cancer    Family history of lymphoma    Family history of pancreatic cancer    Family history of skin cancer    Hypertension    Port-A-Cath in place 06/16/2020    Past Surgical History:  Procedure Laterality Date   BREAST LUMPECTOMY WITH RADIOACTIVE SEED AND SENTINEL LYMPH NODE BIOPSY Right 05/20/2020   Procedure: RIGHT BREAST LUMPECTOMY WITH RADIOACTIVE SEED AND SENTINEL LYMPH NODE BIOPSY;  Surgeon: Coralie Keens, MD;  Location: [place];  Service: General;  Laterality: Right;   PORTACATH PLACEMENT Left 05/20/2020   Procedure: INSERTION PORT-A-CATH;  Surgeon: Coralie Keens, MD;  Location: [place];  Service: General;  Laterality: Left;   TONSILLECTOMY     TUBAL LIGATION      There were no vitals filed for this visit.    Subjective Assessment - 09/21/20 1007     Subjective Left UE started swelling in June from the porta cath.  they put her on Eliquis and the swelling came down.  From the 18th of August through most recent chemo swelling became worse. She had another doppler which showed that clots have resolved. Her arm fatigues alot even with driving.  She still has alot of  visible collateral veins at the chest.  Arm sometimes swells from the upper arm to the hand but does not look that bad this am. She wants to reduce the swelling in the left arm. 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 Ninfa Linden, HTN, DDD lumbar spine, Developed multiple bloodclots in LUE due to portacath which are now resolved.  Swelling from bloodclots has not resolved.    Currently in Pain? Yes    Pain Score 2     Pain Location Shoulder    Pain Orientation Left    Pain Descriptors / Indicators Aching    Pain Onset 1 to 4 weeks ago    Pain Frequency Intermittent    Aggravating Factors  arm feels heavy with driving, clothes don't fit upper arm    Multiple Pain Sites No                [facility] PT Assessment - 09/21/20 0001       Assessment   Medical Diagnosis Lt. upper extremity lymphedema    Referring Provider (PT) Dr. Ninfa Linden    Onset Date/Surgical Date 05/20/20    Hand Dominance Right      Precautions  Precaution Comments lymphedema      Restrictions   Weight Bearing Restrictions No      Balance Screen   Has the patient fallen in the past 6 months No    Has the patient had a decrease in activity level because of a fear of falling?  No    Is the patient reluctant to leave their home because of a fear of falling?  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  Pt advised to try wearing her sports bra      AROM   Right Shoulder Flexion --   WNL   Right Shoulder ABduction --   WNL   Left Shoulder Flexion --   WNL   Left Shoulder ABduction --   WNL              LYMPHEDEMA/ONCOLOGY QUESTIONNAIRE - 09/21/20 0001       Surgeries   Lumpectomy Date 05/20/20    Number Lymph Nodes Removed 5      Treatment   Active Chemotherapy Treatment Yes    Past Chemotherapy Treatment No    Active Radiation Treatment No    Past Radiation Treatment No    Current Hormone Treatment No    Past Hormone Therapy No      What other symptoms do you have   Are you Having Heaviness or Tightness Yes    Are you having Pain Yes   left shoulder   Are you having pitting edema Yes    Is it Hard or Difficult finding clothes that fit Yes    Do you have infections No      Right Upper Extremity Lymphedema   15 cm Proximal to Olecranon Process 30 cm    10 cm Proximal to Olecranon Process 27.1 cm    Olecranon Process 23.3 cm    15 cm Proximal to Ulnar Styloid Process 22.3 cm    10 cm Proximal to Ulnar Styloid Process 19.8 cm    Just Proximal to Ulnar Styloid Process 14.4 cm    Across Hand at [employer] 18.1 cm    At [place] of 2nd Digit 6 cm      Left Upper Extremity Lymphedema   15 cm Proximal to Olecranon Process 30.8 cm    10 cm Proximal to Olecranon Process 28.9 cm    Olecranon Process 23.1 cm    15 cm Proximal to Ulnar Styloid Process 23.5 cm    10 cm Proximal to Ulnar Styloid Process 21.1 cm    Just Proximal to Ulnar Styloid Process 14.8 cm    Across Hand at [employer] 18.5 cm    At [place] of 2nd Digit 5.7 cm                     Objective measurements completed on examination: See above findings.  PT Long Term Goals - 09/21/20 1232       PT LONG TERM GOAL #1   Title Pt will be independent is self MLD to reduce Left UE swelling    Time 6    Period Weeks    Status New    Target Date  11/02/20      PT LONG TERM GOAL #2   Title Pt will have reduction in swelling of left arm by 1-2 cm at 15 cm prox to olecranon and 15 cm prox to ulna    Period Weeks    Status New    Target Date 11/02/20      PT LONG TERM GOAL #3   Title Pt will be fit for appropriate compression garments for UE swelling    Time 6    Period Weeks    Status New    Target Date 11/02/20      PT LONG TERM GOAL #4   Title Pt will be independent in self management of lymphedema    Time 6    Period Weeks    Status New    Target Date 11/02/20      PT LONG TERM GOAL #5   Title Pt will note decreased cording at right breast by 50% or greater    Time 6    Period Weeks    Status New    Target Date 11/02/20                    Plan - 09/21/20 1225     Clinical Impression Statement Pt developed blood clots in the left UE after getting her portacath on the left.  She was placed on Eliquis and blood clots have since resolved but swelling has not.  she had a right breast lumpectomy with 5 negative nodes on 05/20/2020 and she is now noticing some cording at the right lateral breast and has a very small area of fibrosis at the right upper breast.  She will benefit from complete decongestive physical therapy on the left to reduce swelling and from manual techniques on the right for cording and MLD prn.  She will try wearing her sports bra but may benefit from a compression bra    Stability/Clinical Decision Making Stable/Uncomplicated    Clinical Decision Making Low    Rehab Potential Good    PT Frequency 3x / week    PT Duration 6 weeks    PT Treatment/Interventions ADLs/Self Care Home Management;Therapeutic exercise;Manual techniques;Orthotic Fit/Training;Patient/family education;Manual lymph drainage;Compression bandaging;Taping    PT Next Visit Plan MLD, bandaging, exs, MFR for cording prn, assess benefit of sports bra    Consulted and Agree with Plan of Care Patient             Patient will  benefit from skilled therapeutic intervention in order to improve the following deficits and impairments:  Decreased knowledge of precautions, Increased edema, Decreased strength, Pain, Impaired UE functional use  Visit Diagnosis: Secondary lymphedema  Aftercare following surgery for neoplasm  Abnormal posture  Malignant neoplasm of upper-outer quadrant of right breast in female, estrogen receptor positive ([place])     Problem List Patient Active Problem List   Diagnosis Date Noted   Port-A-Cath in place 06/16/2020   Genetic testing 05/12/2020   Family history of pancreatic cancer    Family history of skin cancer    Family history of lymphoma    Family history of colon cancer    Malignant neoplasm of upper-outer quadrant  of right female breast [facility]) 04/16/2020    Claris Pong 09/21/2020, 12:38 PM  [place], [place], [address] Phone: [phone]   Fax:  [phone]  Name: Lauren Ortega MRN: [id number] Date of Birth: December 06, 1980 Cheral Almas, PT 09/21/20 12:39 PM

## 2020-09-24 ENCOUNTER — Encounter (HOSPITAL_COMMUNITY): Payer: Self-pay

## 2020-09-25 ENCOUNTER — Other Ambulatory Visit (HOSPITAL_COMMUNITY): Payer: Self-pay

## 2020-09-25 MED ORDER — MAGNESIUM 400 MG PO TABS: 1.0000 | ORAL_TABLET | Freq: Two times a day (BID) | ORAL | 0 refills | Status: DC

## 2020-09-25 NOTE — Telephone Encounter (Deleted)
Patient sent MyChart message concerning Magnesium being sent in by Dr. Delton Coombes. Note from Dr. Delton Coombes- Please send magnesium '400mg'$  bid. Thanks

## 2020-10-06 ENCOUNTER — Other Ambulatory Visit: Payer: Self-pay

## 2020-10-06 ENCOUNTER — Ambulatory Visit (HOSPITAL_COMMUNITY): Payer: PRIVATE HEALTH INSURANCE | Attending: Family Medicine | Admitting: Physical Therapy

## 2020-10-06 DIAGNOSIS — I890 Lymphedema, not elsewhere classified: Secondary | ICD-10-CM | POA: Diagnosis not present

## 2020-10-06 DIAGNOSIS — Z483 Aftercare following surgery for neoplasm: Secondary | ICD-10-CM | POA: Insufficient documentation

## 2020-10-06 DIAGNOSIS — R293 Abnormal posture: Secondary | ICD-10-CM | POA: Insufficient documentation

## 2020-10-06 DIAGNOSIS — C50411 Malignant neoplasm of upper-outer quadrant of right female breast: Secondary | ICD-10-CM | POA: Insufficient documentation

## 2020-10-06 DIAGNOSIS — Z170 Estrogen receptor positive status [ER+]: Secondary | ICD-10-CM | POA: Diagnosis present

## 2020-10-06 NOTE — Therapy (Signed)
[place], [place], [address] Phone: [phone]   Fax:  [phone]  Physical Therapy Treatment  Patient Details  Name: Lauren Ortega MRN: [id number] Date of Birth: [birth year] Referring Provider (PT): Dr. Ninfa Linden   Encounter Date: 10/06/2020   PT End of Session - 10/06/20 1157     Visit Number 2    Number of Visits 18    Date for PT Re-Evaluation 11/02/20    PT Start Time 0840    PT Stop Time 0920    PT Time Calculation (min) 40 min    Activity Tolerance Patient tolerated treatment well    Behavior During Therapy [facility] for tasks assessed/performed             Past Medical History:  Diagnosis Date   Anemia    Anxiety    Breast cancer ([place]) 03/2020   Family history of colon cancer    Family history of lymphoma    Family history of pancreatic cancer    Family history of skin cancer    Hypertension    Port-A-Cath in place 06/16/2020    Past Surgical History:  Procedure Laterality Date   BREAST LUMPECTOMY WITH RADIOACTIVE SEED AND SENTINEL LYMPH NODE BIOPSY Right 05/20/2020   Procedure: RIGHT BREAST LUMPECTOMY WITH RADIOACTIVE SEED AND SENTINEL LYMPH NODE BIOPSY;  Surgeon: Coralie Keens, MD;  Location: [place];  Service: General;  Laterality: Right;   PORTACATH PLACEMENT Left 05/20/2020   Procedure: INSERTION PORT-A-CATH;  Surgeon: Coralie Keens, MD;  Location: [place];  Service: General;  Laterality: Left;   TONSILLECTOMY     TUBAL LIGATION      There were no vitals filed for this visit.   Subjective Assessment - 10/06/20 1204     Subjective Pt states she was evaluated at [facility] and they gave her a sock that contains some compression to wear on her Lt UE.  States she continues to have some tightness in her Lt UE but reports no pain.    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 Ninfa Linden, HTN, DDD lumbar spine, Developed multiple bloodclots in LUE due to portacath which are now resolved.  Swelling from bloodclots has not resolved.                   LYMPHEDEMA/ONCOLOGY QUESTIONNAIRE - 10/06/20 0001       Right Upper Extremity Lymphedema   15 cm Proximal to Olecranon Process 29 cm   was 30 cm on 8/29   10 cm Proximal to Olecranon Process 27 cm   was 27.1   Olecranon Process 23.3 cm   was 23.3   15 cm Proximal to Ulnar Styloid Process 22.3 cm   was 22.3   10 cm Proximal to Ulnar Styloid Process 20 cm   was 19.8   Just Proximal to Ulnar Styloid Process 14.4 cm   was 14.4   Across Hand at [employer] 17.2 cm   was 18.1   At [place] of 2nd Digit 6 cm   was 6     Left Upper Extremity Lymphedema   15 cm Proximal to Olecranon Process 31 cm   was 30.8   10 cm Proximal to Olecranon Process 29.3 cm   as 28.9   Olecranon Process 24.3 cm   was 23.1   15 cm Proximal to Ulnar Styloid Process  24.5 cm   was 23.5   10 cm Proximal to Ulnar Styloid Process 22.5 cm   was 21.1   Just Proximal to Ulnar Styloid Process 15.3 cm   was 14.8   Across Hand at [employer] 18.6 cm   was 18.5   At [facility] of 2nd Digit 5.6 cm   was 5.7                       [facility] Adult PT Treatment/Exercise - 10/06/20 0001       Manual Therapy   Manual Therapy Manual Lymphatic Drainage (MLD);Other (comment)    Manual therapy comments completed in supine for Lt UE (did not have time for posterior)    Manual Lymphatic Drainage (MLD) For Lt UE with Lt axillary-inguinal anastomosis avoiding Rt axillary nodes.  Short neck, deep and superficial abdominal in supine only today due to time    Other Manual Therapy self manual instructions and measurements completed for Lt UE                     PT Education - 10/06/20 1154     Education Details sefl massage and compression garments.  Information on Elastic therapy and what to obtain for LT UE. Goal and POC  review    Person(s) Educated Patient    Methods Explanation;Demonstration    Comprehension Verbalized understanding;Returned demonstration                 PT Long Term Goals - 09/21/20 1232       PT LONG TERM GOAL #1   Title Pt will be independent is self MLD to reduce Left UE swelling    Time 6    Period Weeks    Status New    Target Date 11/02/20      PT LONG TERM GOAL #2   Title Pt will have reduction in swelling of left arm by 1-2 cm at 15 cm prox to olecranon and 15 cm prox to ulna    Period Weeks    Status New    Target Date 11/02/20      PT LONG TERM GOAL #3   Title Pt will be fit for appropriate compression garments for UE swelling    Time 6    Period Weeks    Status New    Target Date 11/02/20      PT LONG TERM GOAL #4   Title Pt will be independent in self management of lymphedema    Time 6    Period Weeks    Status New    Target Date 11/02/20      PT LONG TERM GOAL #5   Title Pt will note decreased cording at right breast by 50% or greater    Time 6    Period Weeks    Status New    Target Date 11/02/20                   Plan - 10/06/20 1155     Clinical Impression Statement Pt presents to PT today following outpatient evaluation at [facility] outpatient cancer center. Goals and POC moving forward explained to patient.  Discussed education of self-massage for Lt UE and given information to obtain a Lt UE sleeve and gauntlet as she may be able to maintain current level of edema in Lt  UE along with self-massage.  Initiated manual lymph drainage with instructions to only push fluid towards  Lt inguinal and abdominal area.  Pt with lymph nodes removed from Rt axillary when had her surgery for Rt breast cancer so do not want to overload this area.  Educated on bandaging and we would resort to this if the measurements are not showing improvements.   Lt UE remeasured today with slight increase of approx. 1 cm in arm, not change in hand or second digit.  Pt  reports of general weakness in Lt UE and would like to begin strengthening his as well.    Stability/Clinical Decision Making Stable/Uncomplicated    Rehab Potential Good    PT Frequency 3x / week    PT Duration 6 weeks    PT Treatment/Interventions ADLs/Self Care Home Management;Therapeutic exercise;Manual techniques;Orthotic Fit/Training;Patient/family education;Manual lymph drainage;Compression bandaging;Taping    PT Next Visit Plan Continue with MLD and instructions for self massage.  F/U on Lt UE sleeve and begin bandaging if needed according to weekly measurements and efffectivness of garment.  Begin LE UE strengthening. MFR for cording prn at Rt breast and axillary region.  F/U on sports bra    Consulted and Agree with Plan of Care Patient             Patient will benefit from skilled therapeutic intervention in order to improve the following deficits and impairments:  Decreased knowledge of precautions, Increased edema, Decreased strength, Pain, Impaired UE functional use  Visit Diagnosis: Secondary lymphedema     Problem List Patient Active Problem List   Diagnosis Date Noted   Port-A-Cath in place 06/16/2020   Genetic testing 05/12/2020   Family history of pancreatic cancer    Family history of skin cancer    Family history of lymphoma    Family history of colon cancer    Malignant neoplasm of upper-outer quadrant of right female breast ([place]) 04/16/2020   Teena Irani, PTA/CLT [phone]  Teena Irani, PTA 10/06/2020, 12:04 PM  [place], [place], [address] Phone: [phone]   Fax:  [phone]  Name: Lauren Ortega MRN: [id number] Date of Birth: Jul 26, 1980

## 2020-10-06 NOTE — Progress Notes (Signed)
[place], [place] [address]   CLINIC:  Medical Oncology/Hematology  PCP:  [name], [address] [address] [place] [address] [phone]   REASON FOR VISIT:  Follow-up for right breast cancer  PRIOR THERAPY: none  NGS Results: Her2+  CURRENT THERAPY: Docetaxel + Carboplatin + Trastuzumab (TCH) q21d / Trastuzumab q21d  BRIEF ONCOLOGIC HISTORY:  Oncology History  Malignant neoplasm of upper-outer quadrant of right female breast ([place])  04/16/2020 Initial Diagnosis   Malignant neoplasm of upper-outer quadrant of right female breast [facility])    Genetic Testing   Negative genetic testing. No pathogenic variants identified on the [facility] Multi-Cancer Panel+RNA. The report date is 05/10/2020.  The Multi-Cancer Panel + RNA offered by [facility] includes sequencing and/or deletion duplication testing of the following 84 genes: AIP, ALK, APC, ATM, AXIN2,BAP1,  BARD1, BLM, BMPR1A, BRCA1, BRCA2, BRIP1, CASR, CDC73, CDH1, CDK4, CDKN1B, CDKN1C, CDKN2A (p14ARF), CDKN2A (p16INK4a), CEBPA, CHEK2, CTNNA1, DICER1, DIS3L2, EGFR (c.2369C>T, p.Thr790Met variant only), EPCAM (Deletion/duplication testing only), FH, FLCN, GATA2, GPC3, GREM1 (Promoter region deletion/duplication testing only), HOXB13 (c.251G>A, p.Gly84Glu), HRAS, KIT, MAX, MEN1, MET, MITF (c.952G>A, p.Glu318Lys variant only), MLH1, MSH2, MSH3, MSH6, MUTYH, NBN, NF1, NF2, NTHL1, PALB2, PDGFRA, PHOX2B, PMS2, POLD1, POLE, POT1, PRKAR1A, PTCH1, PTEN, RAD50, RAD51C, RAD51D, RB1, RECQL4, RET, RUNX1, SDHAF2, SDHA (sequence changes only), SDHB, SDHC, SDHD, SMAD4, SMARCA4, SMARCB1, SMARCE1, STK11, SUFU, TERC, TERT, TMEM127, TP53, TSC1, TSC2, VHL, WRN and WT1.   06/23/2020 -  Chemotherapy    Patient is on Treatment Plan: BREAST DOCETAXEL + CARBOPLATIN + TRASTUZUMAB ([facility]) Q21D / TRASTUZUMAB Q21D         CANCER STAGING: Cancer Staging Malignant neoplasm of upper-outer quadrant of right female breast  ([place]) Staging form: Breast, AJCC 8th Edition - Clinical stage from 04/21/2020: Stage IA (cT1c, cN0, cM0, G3, ER+, PR-, HER2+) - Unsigned   INTERVAL HISTORY:  [name], a 40 y.o. female, returns for routine follow-up and consideration for next cycle of chemotherapy. Lyly was last seen on 09/16/2020.  Due for cycle #6 of Docetaxel + Carboplatin + Trastuzumab today.   Overall, she tells me she has been feeling pretty well. She reports intermittent numbness in her toes for 10 days following treatment. She reports improved sleep and energy following starting Magnesium. She denies orthopnea, and she reports decreased swelling of her left arm. She reports diarrhea for 10 day following treatment that is helped by Lomotil. She denies nausea and vomiting. Her last menses was 06/14. She reports swelling in her legs and abdomen, and she reports her fingernails are sore and changing color.   Overall, she feels ready for next cycle of chemo today.   REVIEW OF SYSTEMS:  Review of Systems  Constitutional:  Negative for appetite change (90%) and fatigue (75%).  Cardiovascular:  Positive for leg swelling.  Gastrointestinal:  Positive for diarrhea. Negative for nausea and vomiting.  Musculoskeletal:  Positive for myalgias (L arm).  Neurological:  Positive for headaches (sinus) and numbness.  Psychiatric/Behavioral:  Negative for sleep disturbance.   All other systems reviewed and are negative.  PAST MEDICAL/SURGICAL HISTORY:  Past Medical History:  Diagnosis Date   Anemia    Anxiety    Breast cancer ([place]) 03/2020   Family history of colon cancer    Family history of lymphoma    Family history of pancreatic cancer    Family history of skin cancer    Hypertension    Port-A-Cath in place 06/16/2020  Past Surgical History:  Procedure Laterality Date   BREAST LUMPECTOMY WITH RADIOACTIVE SEED AND SENTINEL LYMPH NODE BIOPSY Right 05/20/2020   Procedure: RIGHT BREAST LUMPECTOMY WITH RADIOACTIVE  SEED AND SENTINEL LYMPH NODE BIOPSY;  Surgeon: Coralie Keens, MD;  Location: [facility];  Service: General;  Laterality: Right;   PORTACATH PLACEMENT Left 05/20/2020   Procedure: INSERTION PORT-A-CATH;  Surgeon: Coralie Keens, MD;  Location: [facility];  Service: General;  Laterality: Left;   TONSILLECTOMY     TUBAL LIGATION      SOCIAL HISTORY:  Social History   Socioeconomic History   Marital status: Divorced    Spouse name: Not on file   Number of children: Not on file   Years of education: Not on file   Highest education level: Not on file  Occupational History   Not on file  Tobacco Use   Smoking status: Never   Smokeless tobacco: Never  Substance and Sexual Activity   Alcohol use: Yes    Comment: occassionally   Drug use: Never   Sexual activity: Yes    Birth control/protection: Surgical  Other Topics Concern   Not on file  Social History Narrative   Not on file   Social Determinants of Health   Financial Resource Strain: Low Risk    Difficulty of Paying Living Expenses: Not hard at all  Food Insecurity: No Food Insecurity   Worried About Charity fundraiser in the Last Year: Never true   Ran Out of Food in the Last Year: Never true  Transportation Needs: No Transportation Needs   Lack of Transportation (Medical): No   Lack of Transportation (Non-Medical): No  Physical Activity: Insufficiently Active   Days of Exercise per Week: 2 days   Minutes of Exercise per Session: 20 min  Stress: No Stress Concern Present   Feeling of Stress : Not at all  Social Connections: Moderately Integrated   Frequency of Communication with Friends and Family: More than three times a week   Frequency of Social Gatherings with Friends and Family: More than three times a week   Attends Religious Services: 1 to 4 times per year   Active Member of [employer] or Organizations: No   Attends Music therapist: 1 to 4 times per year   Marital  Status: Divorced  Human resources officer Violence: Not At Risk   Fear of Current or Ex-Partner: No   Emotionally Abused: No   Physically Abused: No   Sexually Abused: No    FAMILY HISTORY:  Family History  Problem Relation Age of Onset   Basal cell carcinoma Mother 41   Bladder Cancer Maternal Uncle 72   Lymphoma Maternal Uncle 40       Waldenstroms    Pancreatic cancer Maternal Grandmother    Non-Hodgkin's lymphoma Maternal Grandfather    Lung cancer Paternal Grandfather    Colon cancer Other        x8 or 9   Colon cancer Maternal Great-grandfather     CURRENT MEDICATIONS:  Current Outpatient Medications  Medication Sig Dispense Refill   amLODipine (NORVASC) 10 MG tablet Take 10 mg by mouth daily.     apixaban (ELIQUIS) 5 MG TABS tablet Take 1 tablet (5 mg total) by mouth 2 (two) times daily. 60 tablet 3   Baclofen 5 MG TABS Take 5 mg by mouth 3 (three) times daily as needed. 30 tablet 0   CARBOPLATIN IV Inject into the vein every 21 ( twenty-one)  days.     diphenoxylate-atropine (LOMOTIL) 2.5-0.025 MG tablet Take 2 tablets by mouth 4 (four) times daily as needed for diarrhea or loose stools. 30 tablet 3   DOCETAXEL IV Inject into the vein every 21 ( twenty-one) days.     FERREX 150 150 MG capsule Take 150 mg by mouth daily.     folic acid (FOLVITE) 1 MG tablet Take by mouth.     lidocaine-prilocaine (EMLA) cream Apply a small amount to port a cath site and cover with plastic wrap 1 hour prior to infusion appointments 30 g 3   Magnesium 400 MG TABS Take 1 tablet by mouth in the morning and at bedtime. 60 tablet 0   meloxicam (MOBIC) 15 MG tablet Take 15 mg by mouth daily as needed.     oxyCODONE (OXY IR/ROXICODONE) 5 MG immediate release tablet Take 1 tablet (5 mg total) by mouth every 6 (six) hours as needed for moderate pain or severe pain. 25 tablet 0   prochlorperazine (COMPAZINE) 10 MG tablet Take 1 tablet (10 mg total) by mouth every 6 (six) hours as needed (Nausea or  vomiting). 30 tablet 1   spironolactone (ALDACTONE) 50 MG tablet Take 50 mg by mouth every morning.     temazepam (RESTORIL) 15 MG capsule Take 1 capsule (15 mg total) by mouth at bedtime as needed for sleep. 30 capsule 0   trastuzumab in sodium chloride 0.9 % 250 mL Inject into the vein every 21 ( twenty-one) days.     vitamin B-12 (CYANOCOBALAMIN) 1000 MCG tablet Take 1 tablet (1,000 mcg total) by mouth daily. 90 tablet 1   No current facility-administered medications for this visit.    ALLERGIES:  No Known Allergies  PHYSICAL EXAM:  Performance status (ECOG): 0 - Asymptomatic  There were no vitals filed for this visit. Wt Readings from Last 3 Encounters:  09/16/20 132 lb 6.4 oz (60.1 kg)  08/26/20 134 lb 14.4 oz (61.2 kg)  08/05/20 132 lb (59.9 kg)   Physical Exam Vitals reviewed.  Constitutional:      Appearance: Normal appearance.  Cardiovascular:     Rate and Rhythm: Normal rate and regular rhythm.     Pulses: Normal pulses.     Heart sounds: Normal heart sounds.  Pulmonary:     Effort: Pulmonary effort is normal.     Breath sounds: Normal breath sounds.  Neurological:     General: No focal deficit present.     Mental Status: She is alert and oriented to person, place, and time.  Psychiatric:        Mood and Affect: Mood normal.        Behavior: Behavior normal.    LABORATORY DATA:  I have reviewed the labs as listed.  CBC Latest Ref Rng & Units 09/16/2020 08/26/2020 08/05/2020  WBC 4.0 - 10.5 K/uL 4.9 4.6 5.5  Hemoglobin 12.0 - 15.0 g/dL 10.2(L) 10.1(L) 10.5(L)  Hematocrit 36.0 - 46.0 % 30.8(L) 31.3(L) 32.3(L)  Platelets 150 - 400 K/uL 194 341 379   CMP Latest Ref Rng & Units 09/16/2020 08/26/2020 08/05/2020  Glucose 70 - 99 mg/dL 123(H) 81 123(H)  BUN 6 - 20 mg/dL '15 14 12  ' Creatinine 0.44 - 1.00 mg/dL 0.71 0.66 0.84  Sodium 135 - 145 mmol/L 137 139 136  Potassium 3.5 - 5.1 mmol/L 4.1 3.8 3.9  Chloride 98 - 111 mmol/L 104 105 101  CO2 22 - 32 mmol/L '25 26 26   ' Calcium 8.9 - 10.3 mg/dL  8.9 9.1 9.2  Total Protein 6.5 - 8.1 g/dL 7.1 7.0 7.3  Total Bilirubin 0.3 - 1.2 mg/dL 0.3 0.7 0.6  Alkaline Phos 38 - 126 U/L 67 75 84  AST 15 - 41 U/L 29 32 39  ALT 0 - 44 U/L 25 29 32    DIAGNOSTIC IMAGING:  I have independently reviewed the scans and discussed with the patient. US Venous Img Upper Uni Left  Addendum Date: 09/18/2020   ADDENDUM REPORT: 09/18/2020 08:03 ADDENDUM: Correction; Subclavian port catheter remains in place. 1. No evidence of DVT within the LEFT upper extremity on today's evaluation. 2. Resolution of previously demonstrated LEFT axillary, subclavian and basilic vein thrombi. Electronically Signed   By: Michaelle Birks M.D.   On: 09/18/2020 08:03   Result Date: 09/18/2020 CLINICAL DATA:  LEFT upper extremity swelling EXAM: LEFT UPPER EXTREMITY VENOUS DOPPLER ULTRASOUND TECHNIQUE: Gray-scale sonography with graded compression, as well as color Doppler and duplex ultrasound were performed to evaluate the upper extremity deep venous system from the level of the subclavian vein and including the jugular, axillary, basilic, radial, ulnar and upper cephalic vein. Spectral Doppler was utilized to evaluate flow at rest and with distal augmentation maneuvers. COMPARISON:  Upper extremity venous duplex, 08/05/2020. Chest radiograph, 05/20/2020. FINDINGS: Contralateral Subclavian Vein: Respiratory phasicity is normal and symmetric with the symptomatic side. No evidence of thrombus. Normal compressibility. Internal Jugular Vein: No evidence of thrombus. Normal compressibility, respiratory phasicity and response to augmentation. Subclavian Vein: Interval removal of LEFT subclavian port catheter. No evidence of thrombus. Normal compressibility, respiratory phasicity and response to augmentation. Axillary Vein: No evidence of thrombus. Normal compressibility, respiratory phasicity and response to augmentation. Cephalic Vein: No evidence of thrombus. Normal  compressibility, respiratory phasicity and response to augmentation. Basilic Vein: No evidence of thrombus. Normal compressibility, respiratory phasicity and response to augmentation. Brachial Veins: No evidence of thrombus. Normal compressibility, respiratory phasicity and response to augmentation. Radial Veins: No evidence of thrombus. Normal compressibility, respiratory phasicity and response to augmentation. Ulnar Veins: No evidence of thrombus. Normal compressibility, respiratory phasicity and response to augmentation. Other Findings:  The contralateral RIGHT subclavian vein is patent. No evidence of superficial thrombophlebitis or abnormal fluid collection. IMPRESSION: 1. Interval removal of subclavian port catheter, with resolution of previously-demonstrated of LEFT axillary, subclavian and basilic venous thrombi. 2. No evidence of DVT within the LEFT upper extremity on today's evaluation. Michaelle Birks, MD [facility] Radiology Electronically Signed: By: Michaelle Birks M.D. On: 09/10/2020 12:12     ASSESSMENT:  1.  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  She had mammogram/ultrasound on 03/18/2020 done in [place] which showed a 1.4 x 0.9 x 1.5 cm mass in the upper outer quadrant of the right breast. -Biopsy on 03/31/2020 consistent with intermediate to high-grade infiltrating ductal carcinoma of the right breast at 10:30 position, HER-2 3+ positive, Ki-67 40%, ER weak staining in less than 10% of tumor cells, PR negative. -MRI of the breast on 04/20/2020 with 1.3 x 1.5 x 1.4 cm irregular enhancing mass within the upper outer right breast.  No abnormal appearing lymph nodes. - Right breast lumpectomy and SLNB on 05/20/2020- grade 3 IDC, 1.8 cm, invasive carcinoma is less than 1 mm from anterior margin focally and less than 1 mm from the posterior margin  broadly.  Margins negative for in situ carcinoma.  Lymphovascular invasion present.  0/5 lymph nodes positive.  There is focal ductal carcinoma within the vascular space in the soft tissue adjacent to the lymph node.  No carcinoma is seen within the lymph nodes.  PT1CPN0. - Genetic testing was negative. - 6 cycles of [place] from 06/23/2020 through 10/07/2020.   2.  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  Never smoker. -Maternal grandmother had colon cancer.  Maternal grandfather died of non-Hodgkin's lymphoma.  Paternal grandfather died of lung cancer.  Maternal uncle had multiple myeloma.  Mother had basal cell skin cancer.  3.  Left upper extremity DVT: - Doppler on 07/06/2020 positive for acute DVT in the left axillary, peripheral subclavian vein and superficial thrombosis involving left basilic vein. - This is port induced and she was started on Eliquis.   PLAN:  1.  Stage I (T1 cN0 M0) right breast IDC, HER2 positive: - She has completed 5 cycles of TCH. - She had diarrhea on and off.  Lomotil helped. - Reviewed labs which showed normal LFTs and electrolytes.  CBC was grossly normal. - She will proceed with cycle 6 today.  Docetaxel will be dose reduced by 20%. - We will make a referral to radiation oncology in [place]. - She has ER staining which was weakly positive.  We will consider tamoxifen with or without GnRH analog.  Last menstrual period was 07/07/2020. - RTC 3 weeks for follow-up with labs and treatment Herceptin.   2.  High risk drug monitoring: - No clinical signs or symptoms of PND or orthopnea.  2D echo on 08/18/2020 shows EF 50-55%.  Prior echo showed EF 55-60%.  We will closely monitor.   3.  Normocytic anemia: - Continue iron tablet daily.  Hemoglobin is 9.6, from myelosuppression.  4.  Left upper extremity DVT: - Continue Eliquis twice daily. - Recent Doppler on 09/10/2020 did not show any evidence of DVT. - She  started physical therapy yesterday.  5.  Hypomagnesemia: - Magnesium today is 1.8.  Continue magnesium twice daily.  6.  Peripheral neuropathy: - She has numbness in the outer toes of both feet, on and off and stable. - We have dose reduced docetaxel by 20% during cycle 5 and 6.  No neuropathic pains.   Orders placed this encounter:  No orders of the defined types were placed in this encounter.    Derek Jack, MD [facility] [phone]   I, Brendolyn Patty, am acting as a scribe for Dr. Derek Jack.  I, Derek Jack MD, have reviewed the above documentation for accuracy and completeness, and I agree with the above.

## 2020-10-07 ENCOUNTER — Inpatient Hospital Stay (HOSPITAL_COMMUNITY): Payer: PRIVATE HEALTH INSURANCE

## 2020-10-07 ENCOUNTER — Inpatient Hospital Stay (HOSPITAL_COMMUNITY): Payer: PRIVATE HEALTH INSURANCE | Attending: Hematology

## 2020-10-07 ENCOUNTER — Inpatient Hospital Stay (HOSPITAL_BASED_OUTPATIENT_CLINIC_OR_DEPARTMENT_OTHER): Payer: PRIVATE HEALTH INSURANCE | Admitting: Hematology

## 2020-10-07 VITALS — BP 104/64 | HR 83 | Temp 97.7°F | Resp 18

## 2020-10-07 DIAGNOSIS — Z95828 Presence of other vascular implants and grafts: Secondary | ICD-10-CM

## 2020-10-07 DIAGNOSIS — Z170 Estrogen receptor positive status [ER+]: Secondary | ICD-10-CM

## 2020-10-07 DIAGNOSIS — Z5112 Encounter for antineoplastic immunotherapy: Secondary | ICD-10-CM | POA: Diagnosis not present

## 2020-10-07 DIAGNOSIS — I82622 Acute embolism and thrombosis of deep veins of left upper extremity: Secondary | ICD-10-CM | POA: Insufficient documentation

## 2020-10-07 DIAGNOSIS — Z5111 Encounter for antineoplastic chemotherapy: Secondary | ICD-10-CM | POA: Insufficient documentation

## 2020-10-07 DIAGNOSIS — Z5189 Encounter for other specified aftercare: Secondary | ICD-10-CM | POA: Insufficient documentation

## 2020-10-07 DIAGNOSIS — Z09 Encounter for follow-up examination after completed treatment for conditions other than malignant neoplasm: Secondary | ICD-10-CM

## 2020-10-07 DIAGNOSIS — Z79899 Other long term (current) drug therapy: Secondary | ICD-10-CM | POA: Diagnosis not present

## 2020-10-07 DIAGNOSIS — C50411 Malignant neoplasm of upper-outer quadrant of right female breast: Secondary | ICD-10-CM

## 2020-10-07 LAB — CBC WITH DIFFERENTIAL/PLATELET
Abs Immature Granulocytes: 0.02 10*3/uL (ref 0.00–0.07)
Basophils Absolute: 0 10*3/uL (ref 0.0–0.1)
Basophils Relative: 1 %
Eosinophils Absolute: 0 10*3/uL (ref 0.0–0.5)
Eosinophils Relative: 1 %
HCT: 29.1 % — ABNORMAL LOW (ref 36.0–46.0)
Hemoglobin: 9.6 g/dL — ABNORMAL LOW (ref 12.0–15.0)
Immature Granulocytes: 0 %
Lymphocytes Relative: 15 %
Lymphs Abs: 0.9 10*3/uL (ref 0.7–4.0)
MCH: 36 pg — ABNORMAL HIGH (ref 26.0–34.0)
MCHC: 33 g/dL (ref 30.0–36.0)
MCV: 109 fL — ABNORMAL HIGH (ref 80.0–100.0)
Monocytes Absolute: 0.9 10*3/uL (ref 0.1–1.0)
Monocytes Relative: 15 %
Neutro Abs: 3.9 10*3/uL (ref 1.7–7.7)
Neutrophils Relative %: 68 %
Platelets: 155 10*3/uL (ref 150–400)
RBC: 2.67 MIL/uL — ABNORMAL LOW (ref 3.87–5.11)
RDW: 19.4 % — ABNORMAL HIGH (ref 11.5–15.5)
WBC: 5.7 10*3/uL (ref 4.0–10.5)
nRBC: 0 % (ref 0.0–0.2)

## 2020-10-07 LAB — COMPREHENSIVE METABOLIC PANEL
ALT: 26 U/L (ref 0–44)
AST: 29 U/L (ref 15–41)
Albumin: 3.9 g/dL (ref 3.5–5.0)
Alkaline Phosphatase: 67 U/L (ref 38–126)
Anion gap: 7 (ref 5–15)
BUN: 12 mg/dL (ref 6–20)
CO2: 26 mmol/L (ref 22–32)
Calcium: 9.1 mg/dL (ref 8.9–10.3)
Chloride: 104 mmol/L (ref 98–111)
Creatinine, Ser: 0.63 mg/dL (ref 0.44–1.00)
GFR, Estimated: >60 mL/min (ref >60)
Glucose, Bld: 98 mg/dL (ref 70–99)
Potassium: 3.8 mmol/L (ref 3.5–5.1)
Sodium: 137 mmol/L (ref 135–145)
Total Bilirubin: 0.5 mg/dL (ref 0.3–1.2)
Total Protein: 6.8 g/dL (ref 6.5–8.1)

## 2020-10-07 LAB — MAGNESIUM: Magnesium: 1.8 mg/dL (ref 1.7–2.4)

## 2020-10-07 MED ORDER — PALONOSETRON HCL INJECTION 0.25 MG/5ML
0.2500 mg | Freq: Once | INTRAVENOUS | Status: AC
  Administered 2020-10-07: 0.25 mg via INTRAVENOUS
  Filled 2020-10-07: qty 5

## 2020-10-07 MED ORDER — ACETAMINOPHEN 325 MG PO TABS
650.0000 mg | ORAL_TABLET | Freq: Once | ORAL | Status: AC
  Administered 2020-10-07: 650 mg via ORAL
  Filled 2020-10-07: qty 2

## 2020-10-07 MED ORDER — SODIUM CHLORIDE 0.9 % IV SOLN
688.2000 mg | Freq: Once | INTRAVENOUS | Status: AC
  Administered 2020-10-07: 690 mg via INTRAVENOUS
  Filled 2020-10-07: qty 69

## 2020-10-07 MED ORDER — SODIUM CHLORIDE 0.9 % IV SOLN: Freq: Once | INTRAVENOUS | Status: AC

## 2020-10-07 MED ORDER — SODIUM CHLORIDE 0.9 % IV SOLN
10.0000 mg | Freq: Once | INTRAVENOUS | Status: AC
  Administered 2020-10-07: 10 mg via INTRAVENOUS
  Filled 2020-10-07: qty 1

## 2020-10-07 MED ORDER — FOSAPREPITANT DIMEGLUMINE INJECTION 150 MG
150.0000 mg | Freq: Once | INTRAVENOUS | Status: AC
  Administered 2020-10-07: 150 mg via INTRAVENOUS
  Filled 2020-10-07: qty 150

## 2020-10-07 MED ORDER — TRASTUZUMAB-DKST CHEMO 150 MG IV SOLR
6.0000 mg/kg | Freq: Once | INTRAVENOUS | Status: AC
  Administered 2020-10-07: 378 mg via INTRAVENOUS
  Filled 2020-10-07: qty 18

## 2020-10-07 MED ORDER — HEPARIN SOD (PORK) LOCK FLUSH 100 UNIT/ML IV SOLN
500.0000 [IU] | Freq: Once | INTRAVENOUS | Status: AC | PRN
  Administered 2020-10-07: 500 [IU]

## 2020-10-07 MED ORDER — DIPHENHYDRAMINE HCL 25 MG PO CAPS
50.0000 mg | ORAL_CAPSULE | Freq: Once | ORAL | Status: AC
  Administered 2020-10-07: 50 mg via ORAL
  Filled 2020-10-07: qty 2

## 2020-10-07 MED ORDER — SODIUM CHLORIDE 0.9 % IV SOLN
60.0000 mg/m2 | Freq: Once | INTRAVENOUS | Status: AC
  Administered 2020-10-07: 100 mg via INTRAVENOUS
  Filled 2020-10-07: qty 10

## 2020-10-07 MED ORDER — SODIUM CHLORIDE 0.9% FLUSH
10.0000 mL | INTRAVENOUS | Status: DC | PRN
  Administered 2020-10-07: 10 mL

## 2020-10-07 NOTE — Progress Notes (Signed)
Patient has been assessed, vital signs and labs have been reviewed by Dr. Katragadda. ANC, Creatinine, LFTs, and Platelets are within treatment parameters per Dr. Katragadda. The patient is good to proceed with treatment at this time. Primary RN and pharmacy aware.  

## 2020-10-07 NOTE — Patient Instructions (Signed)
[address]  Discharge Instructions: Thank you for choosing [facility] to provide your oncology and hematology care.  If you have a lab appointment with the [facility], please come in thru the Main Entrance and check in at the main information desk.  Wear comfortable clothing and clothing appropriate for easy access to any Portacath or PICC line.   We strive to give you quality time with your provider. You may need to reschedule your appointment if you arrive late (15 or more minutes).  Arriving late affects you and other patients whose appointments are after yours.  Also, if you miss three or more appointments without notifying the office, you may be dismissed from the clinic at the provider's discretion.      For prescription refill requests, have your pharmacy contact our office and allow 72 hours for refills to be completed.    Today you received the following chemotherapy and/or immunotherapy agents herceptin, taxotere, and carboplatin      To help prevent nausea and vomiting after your treatment, we encourage you to take your nausea medication as directed.  BELOW ARE SYMPTOMS THAT SHOULD BE REPORTED IMMEDIATELY: *FEVER GREATER THAN 100.4 F (38 C) OR HIGHER *CHILLS OR SWEATING *NAUSEA AND VOMITING THAT IS NOT CONTROLLED WITH YOUR NAUSEA MEDICATION *UNUSUAL SHORTNESS OF BREATH *UNUSUAL BRUISING OR BLEEDING *URINARY PROBLEMS (pain or burning when urinating, or frequent urination) *BOWEL PROBLEMS (unusual diarrhea, constipation, pain near the anus) TENDERNESS IN MOUTH AND THROAT WITH OR WITHOUT PRESENCE OF ULCERS (sore throat, sores in mouth, or a toothache) UNUSUAL RASH, SWELLING OR PAIN  UNUSUAL VAGINAL DISCHARGE OR ITCHING   Items with * indicate a potential emergency and should be followed up as soon as possible or go to the Emergency Department if any problems should occur.  Please show the CHEMOTHERAPY ALERT CARD or IMMUNOTHERAPY ALERT CARD at  check-in to the Emergency Department and triage nurse.  Should you have questions after your visit or need to cancel or reschedule your appointment, please contact [facility] [phone]  and follow the prompts.  Office hours are 8:00 a.m. to 4:30 p.m. Monday - Friday. Please note that voicemails left after 4:00 p.m. may not be returned until the following business day.  We are closed weekends and major holidays. You have access to a nurse at all times for urgent questions. Please call the main number to the clinic [phone] and follow the prompts.  For any non-urgent questions, you may also contact your provider using MyChart. We now offer e-Visits for anyone 56 and older to request care online for non-urgent symptoms. For details visit [email or web address].   Also download the MyChart app! Go to the app store, search "MyChart", open the app, select [facility], and log in with your MyChart username and password.  Due to Covid, a mask is required upon entering the hospital/clinic. If you do not have a mask, one will be given to you upon arrival. For doctor visits, patients may have 1 support person aged 80 or older with them. For treatment visits, patients cannot have anyone with them due to current Covid guidelines and our immunocompromised population.   Trastuzumab injection for infusion What is this medication? TRASTUZUMAB (tras TOO zoo mab) is a monoclonal antibody. It is used to treat breast cancer and stomach cancer. This medicine may be used for other purposes; ask your health care provider or pharmacist if you have questions. COMMON BRAND NAME(S): Herceptin, Belenda Cruise, Ogivri, Chalmers Guest  What should I tell my care team before I take this medication? They need to know if you have any of these conditions: heart disease heart failure lung or breathing disease, like asthma an unusual or allergic reaction to trastuzumab, benzyl alcohol, or other  medications, foods, dyes, or preservatives pregnant or trying to get pregnant breast-feeding How should I use this medication? This drug is given as an infusion into a vein. It is administered in a hospital or clinic by a specially trained health care professional. Talk to your pediatrician regarding the use of this medicine in children. This medicine is not approved for use in children. Overdosage: If you think you have taken too much of this medicine contact a poison control center or emergency room at once. NOTE: This medicine is only for you. Do not share this medicine with others. What if I miss a dose? It is important not to miss a dose. Call your doctor or health care professional if you are unable to keep an appointment. What may interact with this medication? This medicine may interact with the following medications: certain types of chemotherapy, such as daunorubicin, doxorubicin, epirubicin, and idarubicin This list may not describe all possible interactions. Give your health care provider a list of all the medicines, herbs, non-prescription drugs, or dietary supplements you use. Also tell them if you smoke, drink alcohol, or use illegal drugs. Some items may interact with your medicine. What should I watch for while using this medication? Visit your doctor for checks on your progress. Report any side effects. Continue your course of treatment even though you feel ill unless your doctor tells you to stop. Call your doctor or health care professional for advice if you get a fever, chills or sore throat, or other symptoms of a cold or flu. Do not treat yourself. Try to avoid being around people who are sick. You may experience fever, chills and shaking during your first infusion. These effects are usually mild and can be treated with other medicines. Report any side effects during the infusion to your health care professional. Fever and chills usually do not happen with later infusions. Do  not become pregnant while taking this medicine or for 7 months after stopping it. Women should inform their doctor if they wish to become pregnant or think they might be pregnant. Women of child-bearing potential will need to have a negative pregnancy test before starting this medicine. There is a potential for serious side effects to an unborn child. Talk to your health care professional or pharmacist for more information. Do not breast-feed an infant while taking this medicine or for 7 months after stopping it. Women must use effective birth control with this medicine. What side effects may I notice from receiving this medication? Side effects that you should report to your doctor or health care professional as soon as possible: allergic reactions like skin rash, itching or hives, swelling of the face, lips, or tongue chest pain or palpitations cough dizziness feeling faint or lightheaded, falls fever general ill feeling or flu-like symptoms signs of worsening heart failure like breathing problems; swelling in your legs and feet unusually weak or tired Side effects that usually do not require medical attention (report to your doctor or health care professional if they continue or are bothersome): bone pain changes in taste diarrhea joint pain nausea/vomiting weight loss This list may not describe all possible side effects. Call your doctor for medical advice about side effects. You may report side effects to  FDA at 1-800-FDA-1088. Where should I keep my medication? This drug is given in a hospital or clinic and will not be stored at home. NOTE: This sheet is a summary. It may not cover all possible information. If you have questions about this medicine, talk to your doctor, pharmacist, or health care provider.  2022 Elsevier/Gold Standard (2016-01-05 14:37:52)   Docetaxel injection What is this medication? DOCETAXEL (doe se TAX el) is a chemotherapy drug. It targets fast dividing  cells, like cancer cells, and causes these cells to die. This medicine is used to treat many types of cancers like breast cancer, certain stomach cancers, head and neck cancer, lung cancer, and prostate cancer. This medicine may be used for other purposes; ask your health care provider or pharmacist if you have questions. COMMON BRAND NAME(S): Docefrez, Taxotere What should I tell my care team before I take this medication? They need to know if you have any of these conditions: infection (especially a virus infection such as chickenpox, cold sores, or herpes) liver disease low blood counts, like low white cell, platelet, or red cell counts an unusual or allergic reaction to docetaxel, polysorbate 80, other chemotherapy agents, other medicines, foods, dyes, or preservatives pregnant or trying to get pregnant breast-feeding How should I use this medication? This drug is given as an infusion into a vein. It is administered in a hospital or clinic by a specially trained health care professional. Talk to your pediatrician regarding the use of this medicine in children. Special care may be needed. Overdosage: If you think you have taken too much of this medicine contact a poison control center or emergency room at once. NOTE: This medicine is only for you. Do not share this medicine with others. What if I miss a dose? It is important not to miss your dose. Call your doctor or health care professional if you are unable to keep an appointment. What may interact with this medication? Do not take this medicine with any of the following medications: live virus vaccines This medicine may also interact with the following medications: aprepitant certain antibiotics like erythromycin or clarithromycin certain antivirals for HIV or hepatitis certain medicines for fungal infections like fluconazole, itraconazole, ketoconazole, posaconazole, or  voriconazole cimetidine ciprofloxacin conivaptan cyclosporine dronedarone fluvoxamine grapefruit juice imatinib verapamil This list may not describe all possible interactions. Give your health care provider a list of all the medicines, herbs, non-prescription drugs, or dietary supplements you use. Also tell them if you smoke, drink alcohol, or use illegal drugs. Some items may interact with your medicine. What should I watch for while using this medication? Your condition will be monitored carefully while you are receiving this medicine. You will need important blood work done while you are taking this medicine. Call your doctor or health care professional for advice if you get a fever, chills or sore throat, or other symptoms of a cold or flu. Do not treat yourself. This drug decreases your body's ability to fight infections. Try to avoid being around people who are sick. Some products may contain alcohol. Ask your health care professional if this medicine contains alcohol. Be sure to tell all health care professionals you are taking this medicine. Certain medicines, like metronidazole and disulfiram, can cause an unpleasant reaction when taken with alcohol. The reaction includes flushing, headache, nausea, vomiting, sweating, and increased thirst. The reaction can last from 30 minutes to several hours. You may get drowsy or dizzy. Do not drive, use machinery, or do anything that  needs mental alertness until you know how this medicine affects you. Do not stand or sit up quickly, especially if you are an older patient. This reduces the risk of dizzy or fainting spells. Alcohol may interfere with the effect of this medicine. Talk to your health care professional about your risk of cancer. You may be more at risk for certain types of cancer if you take this medicine. Do not become pregnant while taking this medicine or for 6 months after stopping it. Women should inform their doctor if they wish to  become pregnant or think they might be pregnant. There is a potential for serious side effects to an unborn child. Talk to your health care professional or pharmacist for more information. Do not breast-feed an infant while taking this medicine or for 1 week after stopping it. Males who get this medicine must use a condom during sex with females who can get pregnant. If you get a woman pregnant, the baby could have birth defects. The baby could die before they are born. You will need to continue wearing a condom for 3 months after stopping the medicine. Tell your health care provider right away if your partner becomes pregnant while you are taking this medicine. This may interfere with the ability to father a child. You should talk to your doctor or health care professional if you are concerned about your fertility. What side effects may I notice from receiving this medication? Side effects that you should report to your doctor or health care professional as soon as possible: allergic reactions like skin rash, itching or hives, swelling of the face, lips, or tongue blurred vision breathing problems changes in vision low blood counts - This drug may decrease the number of white blood cells, red blood cells and platelets. You may be at increased risk for infections and bleeding. nausea and vomiting pain, redness or irritation at site where injected pain, tingling, numbness in the hands or feet redness, blistering, peeling, or loosening of the skin, including inside the mouth signs of decreased platelets or bleeding - bruising, pinpoint red spots on the skin, black, tarry stools, nosebleeds signs of decreased red blood cells - unusually weak or tired, fainting spells, lightheadedness signs of infection - fever or chills, cough, sore throat, pain or difficulty passing urine swelling of the ankle, feet, hands Side effects that usually do not require medical attention (report to your doctor or health  care professional if they continue or are bothersome): constipation diarrhea fingernail or toenail changes hair loss loss of appetite mouth sores muscle pain This list may not describe all possible side effects. Call your doctor for medical advice about side effects. You may report side effects to FDA at 1-800-FDA-1088. Where should I keep my medication? This drug is given in a hospital or clinic and will not be stored at home. NOTE: This sheet is a summary. It may not cover all possible information. If you have questions about this medicine, talk to your doctor, pharmacist, or health care provider.  2022 Elsevier/Gold Standard (2018-12-10 19:50:31)   Carboplatin injection What is this medication? CARBOPLATIN (KAR boe pla tin) is a chemotherapy drug. It targets fast dividing cells, like cancer cells, and causes these cells to die. This medicine is used to treat ovarian cancer and many other cancers. This medicine may be used for other purposes; ask your health care provider or pharmacist if you have questions. COMMON BRAND NAME(S): Paraplatin What should I tell my care team before I take  this medication? They need to know if you have any of these conditions: blood disorders hearing problems kidney disease recent or ongoing radiation therapy an unusual or allergic reaction to carboplatin, cisplatin, other chemotherapy, other medicines, foods, dyes, or preservatives pregnant or trying to get pregnant breast-feeding How should I use this medication? This drug is usually given as an infusion into a vein. It is administered in a hospital or clinic by a specially trained health care professional. Talk to your pediatrician regarding the use of this medicine in children. Special care may be needed. Overdosage: If you think you have taken too much of this medicine contact a poison control center or emergency room at once. NOTE: This medicine is only for you. Do not share this medicine with  others. What if I miss a dose? It is important not to miss a dose. Call your doctor or health care professional if you are unable to keep an appointment. What may interact with this medication? medicines for seizures medicines to increase blood counts like filgrastim, pegfilgrastim, sargramostim some antibiotics like amikacin, gentamicin, neomycin, streptomycin, tobramycin vaccines Talk to your doctor or health care professional before taking any of these medicines: acetaminophen aspirin ibuprofen ketoprofen naproxen This list may not describe all possible interactions. Give your health care provider a list of all the medicines, herbs, non-prescription drugs, or dietary supplements you use. Also tell them if you smoke, drink alcohol, or use illegal drugs. Some items may interact with your medicine. What should I watch for while using this medication? Your condition will be monitored carefully while you are receiving this medicine. You will need important blood work done while you are taking this medicine. This drug may make you feel generally unwell. This is not uncommon, as chemotherapy can affect healthy cells as well as cancer cells. Report any side effects. Continue your course of treatment even though you feel ill unless your doctor tells you to stop. In some cases, you may be given additional medicines to help with side effects. Follow all directions for their use. Call your doctor or health care professional for advice if you get a fever, chills or sore throat, or other symptoms of a cold or flu. Do not treat yourself. This drug decreases your body's ability to fight infections. Try to avoid being around people who are sick. This medicine may increase your risk to bruise or bleed. Call your doctor or health care professional if you notice any unusual bleeding. Be careful brushing and flossing your teeth or using a toothpick because you may get an infection or bleed more easily. If you  have any dental work done, tell your dentist you are receiving this medicine. Avoid taking products that contain aspirin, acetaminophen, ibuprofen, naproxen, or ketoprofen unless instructed by your doctor. These medicines may hide a fever. Do not become pregnant while taking this medicine. Women should inform their doctor if they wish to become pregnant or think they might be pregnant. There is a potential for serious side effects to an unborn child. Talk to your health care professional or pharmacist for more information. Do not breast-feed an infant while taking this medicine. What side effects may I notice from receiving this medication? Side effects that you should report to your doctor or health care professional as soon as possible: allergic reactions like skin rash, itching or hives, swelling of the face, lips, or tongue signs of infection - fever or chills, cough, sore throat, pain or difficulty passing urine signs of decreased platelets  or bleeding - bruising, pinpoint red spots on the skin, black, tarry stools, nosebleeds signs of decreased red blood cells - unusually weak or tired, fainting spells, lightheadedness breathing problems changes in hearing changes in vision chest pain high blood pressure low blood counts - This drug may decrease the number of white blood cells, red blood cells and platelets. You may be at increased risk for infections and bleeding. nausea and vomiting pain, swelling, redness or irritation at the injection site pain, tingling, numbness in the hands or feet problems with balance, talking, walking trouble passing urine or change in the amount of urine Side effects that usually do not require medical attention (report to your doctor or health care professional if they continue or are bothersome): hair loss loss of appetite metallic taste in the mouth or changes in taste This list may not describe all possible side effects. Call your doctor for medical  advice about side effects. You may report side effects to FDA at 1-800-FDA-1088. Where should I keep my medication? This drug is given in a hospital or clinic and will not be stored at home. NOTE: This sheet is a summary. It may not cover all possible information. If you have questions about this medicine, talk to your doctor, pharmacist, or health care provider.  2022 Elsevier/Gold Standard (2007-04-17 14:38:05)

## 2020-10-07 NOTE — Patient Instructions (Addendum)
[facility] at [facility] Discharge Instructions  You were seen today by Dr. Delton Coombes. He went over your recent results, and you received your treatment. You will be referred for radiation therapy in [place]. Dr. Delton Coombes will see you back in 3 weeks for labs and follow up.   Thank you for choosing [facility] at [facility] to provide your oncology and hematology care.  To afford each patient quality time with our provider, please arrive at least 15 minutes before your scheduled appointment time.   If you have a lab appointment with the [facility] please come in thru the Main Entrance and check in at the main information desk  You need to re-schedule your appointment should you arrive 10 or more minutes late.  We strive to give you quality time with our providers, and arriving late affects you and other patients whose appointments are after yours.  Also, if you no show three or more times for appointments you may be dismissed from the clinic at the providers discretion.     Again, thank you for choosing [facility].  Our hope is that these requests will decrease the amount of time that you wait before being seen by our physicians.       _____________________________________________________________  Should you have questions after your visit to [facility], please contact our office at [phone] between the hours of 8:00 a.m. and 4:30 p.m.  Voicemails left after 4:00 p.m. will not be returned until the following business day.  For prescription refill requests, have your pharmacy contact our office and allow 72 hours.    Cancer Center Support Programs:   > Cancer Support Group  2nd Tuesday of the month 1pm-2pm, Journey Room

## 2020-10-07 NOTE — Progress Notes (Signed)
Pt here for D1C6 of ogiviri, taxotere, and carbo. Labs and vital signs WNL for treatment today. Okay for treatment today.   Toelrated teratment well today wityout incidence.  AVS reviewed.  Vital signs stable prior to discharge.  Stable Discharged in stable condition ambulatory.

## 2020-10-08 ENCOUNTER — Other Ambulatory Visit: Payer: Self-pay

## 2020-10-08 ENCOUNTER — Encounter (HOSPITAL_COMMUNITY): Payer: Self-pay | Admitting: Lab

## 2020-10-08 ENCOUNTER — Encounter (HOSPITAL_COMMUNITY): Payer: Self-pay

## 2020-10-08 ENCOUNTER — Ambulatory Visit (HOSPITAL_COMMUNITY): Payer: PRIVATE HEALTH INSURANCE

## 2020-10-08 ENCOUNTER — Telehealth (HOSPITAL_COMMUNITY): Payer: Self-pay | Admitting: *Deleted

## 2020-10-08 DIAGNOSIS — C50411 Malignant neoplasm of upper-outer quadrant of right female breast: Secondary | ICD-10-CM

## 2020-10-08 DIAGNOSIS — I890 Lymphedema, not elsewhere classified: Secondary | ICD-10-CM | POA: Diagnosis not present

## 2020-10-08 DIAGNOSIS — Z170 Estrogen receptor positive status [ER+]: Secondary | ICD-10-CM

## 2020-10-08 DIAGNOSIS — R293 Abnormal posture: Secondary | ICD-10-CM

## 2020-10-08 DIAGNOSIS — Z483 Aftercare following surgery for neoplasm: Secondary | ICD-10-CM

## 2020-10-08 NOTE — Therapy (Signed)
[place], [place], [address] Phone: [phone]   Fax:  [phone]  Physical Therapy Treatment  Patient Details  Name: Lauren Ortega MRN: [id number] Date of Birth: Jan 06, 1981 Referring Provider (PT): Dr. Ninfa Linden   Encounter Date: 10/08/2020   PT End of Session - 10/08/20 0841     Visit Number 3    Number of Visits 18    Date for PT Re-Evaluation 11/02/20    PT Start Time 0833    PT Stop Time 0913    PT Time Calculation (min) 40 min    Activity Tolerance Patient tolerated treatment well    Behavior During Therapy [facility] for tasks assessed/performed             Past Medical History:  Diagnosis Date   Anemia    Anxiety    Breast cancer ([place]) 03/2020   Family history of colon cancer    Family history of lymphoma    Family history of pancreatic cancer    Family history of skin cancer    Hypertension    Port-A-Cath in place 06/16/2020    Past Surgical History:  Procedure Laterality Date   BREAST LUMPECTOMY WITH RADIOACTIVE SEED AND SENTINEL LYMPH NODE BIOPSY Right 05/20/2020   Procedure: RIGHT BREAST LUMPECTOMY WITH RADIOACTIVE SEED AND SENTINEL LYMPH NODE BIOPSY;  Surgeon: Coralie Keens, MD;  Location: [place];  Service: General;  Laterality: Right;   PORTACATH PLACEMENT Left 05/20/2020   Procedure: INSERTION PORT-A-CATH;  Surgeon: Coralie Keens, MD;  Location: [facility] [facility] [place];  Service: General;  Laterality: Left;   TONSILLECTOMY     TUBAL LIGATION      There were no vitals filed for this visit.   Subjective Assessment - 10/08/20 0838     Subjective Pt stated she feels the massage was helpful, able to see bones at wrist.  Does have some questions concerning manual and working during the day not sure when able to get measured for sleeve.    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 Ninfa Linden, HTN, DDD lumbar spine, Developed multiple bloodclots in LUE due to portacath which are now resolved.  Swelling from bloodclots has not resolved.    Patient Stated Goals get information from providers    Currently in Pain? Yes    Pain Score 3     Pain Location Arm    Pain Orientation Left    Pain Descriptors / Indicators Tightness;Sore    Pain Onset 1 to 4 weeks ago    Pain Frequency Intermittent                               [facility] Adult PT Treatment/Exercise - 10/08/20 0001       Posture/Postural Control   Posture/Postural Control Postural limitations    Postural Limitations Rounded Shoulders;Forward head      Exercises   Exercises Shoulder      Shoulder Exercises: Standing   Other Standing Exercises Standing tall bicep curl to wback and then over head press with 1#      Shoulder Exercises: Stretch   Corner Stretch 3 reps;30 seconds      Manual Therapy   Manual Therapy Manual Lymphatic Drainage (MLD);Other (comment)    Manual therapy comments Manual complete separate than rest of tx    Manual Lymphatic  Drainage (MLD) For Lt UE anterior and posterior in supine then sidelying    Other Manual Therapy self manual instrcutions for decognestive and cording on Rt breast                          PT Long Term Goals - 09/21/20 1232       PT LONG TERM GOAL #1   Title Pt will be independent is self MLD to reduce Left UE swelling    Time 6    Period Weeks    Status New    Target Date 11/02/20      PT LONG TERM GOAL #2   Title Pt will have reduction in swelling of left arm by 1-2 cm at 15 cm prox to olecranon and 15 cm prox to ulna    Period Weeks    Status New    Target Date 11/02/20      PT LONG TERM GOAL #3   Title Pt will be fit for appropriate compression garments for UE swelling    Time 6    Period Weeks    Status New    Target Date 11/02/20      PT LONG TERM GOAL #4   Title Pt will be independent in self  management of lymphedema    Time 6    Period Weeks    Status New    Target Date 11/02/20      PT LONG TERM GOAL #5   Title Pt will note decreased cording at right breast by 50% or greater    Time 6    Period Weeks    Status New    Target Date 11/02/20                   Plan - 10/08/20 1015     Clinical Impression Statement Manual decongestive technqiues complete anterior and postuerior for Lt UE from Lt axillary to Lt inguinal anastomsis.  Pt educated on self manual techniques with handout given.  Manual scar tissue massage complete to address cording and instructed to complete Rt axillary and Rt breast, may need to review technqiue as pt as tendency to push vs stretch.  Added strengthening exercises and pec stretch to HEP with handout given.    Stability/Clinical Decision Making Stable/Uncomplicated    Clinical Decision Making Low    Rehab Potential Good    PT Frequency 3x / week    PT Duration 6 weeks    PT Treatment/Interventions ADLs/Self Care Home Management;Therapeutic exercise;Manual techniques;Orthotic Fit/Training;Patient/family education;Manual lymph drainage;Compression bandaging;Taping    PT Next Visit Plan Continue with MLD and instructions for self massage.  F/U on Lt UE sleeve and begin bandaging if needed according to weekly measurements and efffectivness of garment.  Begin LE UE strengthening. MFR for cording prn at Rt breast and axillary region.  F/U on sports bra    PT Home Exercise Plan 9/15: self manual, wback, pec strech    Consulted and Agree with Plan of Care Patient             Patient will benefit from skilled therapeutic intervention in order to improve the following deficits and impairments:  Decreased knowledge of precautions, Increased edema, Decreased strength, Pain, Impaired UE functional use  Visit Diagnosis: Secondary lymphedema  Aftercare following surgery for neoplasm  Abnormal posture  Malignant neoplasm of upper-outer quadrant  of right breast in female, estrogen receptor positive ([place])     Problem  List Patient Active Problem List   Diagnosis Date Noted   Port-A-Cath in place 06/16/2020   Genetic testing 05/12/2020   Family history of pancreatic cancer    Family history of skin cancer    Family history of lymphoma    Family history of colon cancer    Malignant neoplasm of upper-outer quadrant of right female breast ([place]) 04/16/2020   Ihor Austin, LPTA/CLT; CBIS [phone]  Aldona Lento, PTA 10/08/2020, 10:24 AM  [place], [place], [address] Phone: [phone]   Fax:  [phone]  Name: Lauren Ortega MRN: [id number] Date of Birth: 07-10-1980

## 2020-10-08 NOTE — Telephone Encounter (Signed)
Opened in error

## 2020-10-08 NOTE — Progress Notes (Unsigned)
Referral sent to [facility].  Records faxed on 9/15

## 2020-10-09 ENCOUNTER — Inpatient Hospital Stay (HOSPITAL_COMMUNITY): Payer: PRIVATE HEALTH INSURANCE

## 2020-10-09 ENCOUNTER — Encounter (HOSPITAL_COMMUNITY): Payer: Self-pay

## 2020-10-09 VITALS — BP 116/65 | HR 76 | Temp 98.2°F | Resp 18

## 2020-10-09 DIAGNOSIS — Z95828 Presence of other vascular implants and grafts: Secondary | ICD-10-CM

## 2020-10-09 DIAGNOSIS — Z5112 Encounter for antineoplastic immunotherapy: Secondary | ICD-10-CM | POA: Diagnosis not present

## 2020-10-09 DIAGNOSIS — C50411 Malignant neoplasm of upper-outer quadrant of right female breast: Secondary | ICD-10-CM

## 2020-10-09 DIAGNOSIS — Z170 Estrogen receptor positive status [ER+]: Secondary | ICD-10-CM

## 2020-10-09 MED ORDER — PEGFILGRASTIM-BMEZ 6 MG/0.6ML ~~LOC~~ SOSY
6.0000 mg | PREFILLED_SYRINGE | Freq: Once | SUBCUTANEOUS | Status: AC
  Administered 2020-10-09: 6 mg via SUBCUTANEOUS
  Filled 2020-10-09: qty 0.6

## 2020-10-09 NOTE — Patient Instructions (Signed)
[facility]  Discharge Instructions: Thank you for choosing [facility] to provide your oncology and hematology care.  If you have a lab appointment with the Cancer Center, please come in thru the Main Entrance and check in at the main information desk.  Wear comfortable clothing and clothing appropriate for easy access to any Portacath or PICC line.   We strive to give you quality time with your provider. You may need to reschedule your appointment if you arrive late (15 or more minutes).  Arriving late affects you and other patients whose appointments are after yours.  Also, if you miss three or more appointments without notifying the office, you may be dismissed from the clinic at the provider's discretion.      For prescription refill requests, have your pharmacy contact our office and allow 72 hours for refills to be completed.    Today you received the following chemotherapy and/or immunotherapy agents Ziextenzo. Return as scheduled. To help prevent nausea and vomiting after your treatment, we encourage you to take your nausea medication as directed.  BELOW ARE SYMPTOMS THAT SHOULD BE REPORTED IMMEDIATELY: *FEVER GREATER THAN 100.4 F (38 C) OR HIGHER *CHILLS OR SWEATING *NAUSEA AND VOMITING THAT IS NOT CONTROLLED WITH YOUR NAUSEA MEDICATION *UNUSUAL SHORTNESS OF BREATH *UNUSUAL BRUISING OR BLEEDING *URINARY PROBLEMS (pain or burning when urinating, or frequent urination) *BOWEL PROBLEMS (unusual diarrhea, constipation, pain near the anus) TENDERNESS IN MOUTH AND THROAT WITH OR WITHOUT PRESENCE OF ULCERS (sore throat, sores in mouth, or a toothache) UNUSUAL RASH, SWELLING OR PAIN  UNUSUAL VAGINAL DISCHARGE OR ITCHING   Items with * indicate a potential emergency and should be followed up as soon as possible or go to the Emergency Department if any problems should occur.  Please show the CHEMOTHERAPY ALERT CARD or IMMUNOTHERAPY ALERT CARD at check-in to the  Emergency Department and triage nurse.  Should you have questions after your visit or need to cancel or reschedule your appointment, please contact [facility] [phone]  and follow the prompts.  Office hours are 8:00 a.m. to 4:30 p.m. Monday - Friday. Please note that voicemails left after 4:00 p.m. may not be returned until the following business day.  We are closed weekends and major holidays. You have access to a nurse at all times for urgent questions. Please call the main number to the clinic [phone] and follow the prompts.  For any non-urgent questions, you may also contact your provider using MyChart. We now offer e-Visits for anyone 18 and older to request care online for non-urgent symptoms. For details visit [email or web address].   Also download the MyChart app! Go to the app store, search "MyChart", open the app, select [facility], and log in with your MyChart username and password.  Due to Covid, a mask is required upon entering the hospital/clinic. If you do not have a mask, one will be given to you upon arrival. For doctor visits, patients may have 1 support person aged 18 or older with them. For treatment visits, patients cannot have anyone with them due to current Covid guidelines and our immunocompromised population.  

## 2020-10-09 NOTE — Progress Notes (Signed)
Patient tolerated injection with no complaints voiced. Site clean and dry with no bruising or swelling noted at site. See MAR for details. Band aid applied.  Patient stable during and after injection. VSS with discharge and left in satisfactory condition with no s/s of distress noted.  

## 2020-10-12 ENCOUNTER — Ambulatory Visit (HOSPITAL_COMMUNITY): Payer: PRIVATE HEALTH INSURANCE | Admitting: Physical Therapy

## 2020-10-12 ENCOUNTER — Encounter (HOSPITAL_COMMUNITY): Payer: Self-pay | Admitting: Physical Therapy

## 2020-10-12 ENCOUNTER — Other Ambulatory Visit: Payer: Self-pay

## 2020-10-12 DIAGNOSIS — I890 Lymphedema, not elsewhere classified: Secondary | ICD-10-CM | POA: Diagnosis not present

## 2020-10-12 NOTE — Therapy (Signed)
[place], [place], [address] Phone: [phone]   Fax:  [phone]  Physical Therapy Treatment  Patient Details  Name: Lauren Ortega MRN: [id number] Date of Birth: 04-Mar-1980 Referring Provider (PT): Dr. Ninfa Linden   Encounter Date: 10/12/2020   PT End of Session - 10/12/20 0915     Visit Number 4    Number of Visits 18    Date for PT Re-Evaluation 11/02/20    PT Start Time 0832    PT Stop Time 0912    PT Time Calculation (min) 40 min    Activity Tolerance Patient tolerated treatment well    Behavior During Therapy [facility] for tasks assessed/performed             Past Medical History:  Diagnosis Date   Anemia    Anxiety    Breast cancer ([place]) 03/2020   Family history of colon cancer    Family history of lymphoma    Family history of pancreatic cancer    Family history of skin cancer    Hypertension    Port-A-Cath in place 06/16/2020    Past Surgical History:  Procedure Laterality Date   BREAST LUMPECTOMY WITH RADIOACTIVE SEED AND SENTINEL LYMPH NODE BIOPSY Right 05/20/2020   Procedure: RIGHT BREAST LUMPECTOMY WITH RADIOACTIVE SEED AND SENTINEL LYMPH NODE BIOPSY;  Surgeon: Coralie Keens, MD;  Location: [facility];  Service: General;  Laterality: Right;   PORTACATH PLACEMENT Left 05/20/2020   Procedure: INSERTION PORT-A-CATH;  Surgeon: Coralie Keens, MD;  Location: [facility];  Service: General;  Laterality: Left;   TONSILLECTOMY     TUBAL LIGATION      There were no vitals filed for this visit.   Subjective Assessment - 10/12/20 0913     Subjective Pt states she has been doing the self manual, showed therapist who noted pt was going to deep and to fast.  Reviewed proper technique with pt.  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 Ninfa Linden, HTN, DDD lumbar spine, Developed multiple bloodclots in LUE due to portacath which are now resolved.  Swelling from bloodclots has not resolved.    Patient Stated Goals get information from providers    Pain Onset 1 to 4 weeks ago             NO pain                   [facility] Adult PT Treatment/Exercise - 10/12/20 0001       Manual Therapy   Manual Therapy Manual Lymphatic Drainage (MLD);Other (comment)    Manual therapy comments Manual complete separate than rest of tx    Manual Lymphatic Drainage (MLD) For Lt UE anterior and posterior in prone postion    Other Manual Therapy self manual instrcutions for decognestive and cording on Rt breast                     PT Education - 10/12/20 0915     Education Details reviewed self massage techniques.    Person(s) Educated Patient    Comprehension Verbalized understanding                 PT Long Term Goals - 10/12/20 0918       PT LONG TERM GOAL #1  Title Pt will be independent is self MLD to reduce Left UE swelling    Time 6    Period Weeks    Status Partially Met      PT LONG TERM GOAL #2   Title Pt will have reduction in swelling of left arm by 1-2 cm at 15 cm prox to olecranon and 15 cm prox to ulna    Period Weeks    Status On-going      PT LONG TERM GOAL #3   Title Pt will be fit for appropriate compression garments for UE swelling    Time 6    Period Weeks    Status On-going      PT LONG TERM GOAL #4   Title Pt will be independent in self management of lymphedema    Time 6    Period Weeks    Status Partially Met      PT LONG TERM GOAL #5   Title Pt will note decreased cording at right breast by 50% or greater    Time 6    Period Weeks    Status Partially Met                   Plan - 10/12/20 0916     Clinical Impression Statement Noted congestion in sub axillary region, extra time spent in this area as well as explaining to  pt that she, too should spend extra time her.  Educated pt that she may want to wear a compression bra or large stand compression bra.    Stability/Clinical Decision Making Stable/Uncomplicated    Rehab Potential Good    PT Frequency 3x / week    PT Duration 6 weeks    PT Treatment/Interventions ADLs/Self Care Home Management;Therapeutic exercise;Manual techniques;Orthotic Fit/Training;Patient/family education;Manual lymph drainage;Compression bandaging;Taping    PT Next Visit Plan Continue with MLD and instructions for self massage.  F/U on Lt UE sleeve and begin bandaging if needed according to weekly measurements and efffectivness of garment.  Measure on Friday as pt will not be able to come in on [place] 9/15: self manual, wback, pec strech    Consulted and Agree with Plan of Care Patient             Patient will benefit from skilled therapeutic intervention in order to improve the following deficits and impairments:  Decreased knowledge of precautions, Increased edema, Decreased strength, Pain, Impaired UE functional use  Visit Diagnosis: Lymphedema, not elsewhere classified     Problem List Patient Active Problem List   Diagnosis Date Noted   Port-A-Cath in place 06/16/2020   Genetic testing 05/12/2020   Family history of pancreatic cancer    Family history of skin cancer    Family history of lymphoma    Family history of colon cancer    Malignant neoplasm of upper-outer quadrant of right female breast ([place]) 04/16/2020    Rayetta Humphrey, PT CLT [phone]  10/12/2020, [place], [place], [address] Phone: [phone]   Fax:  [phone]  Name: Lauren Ortega MRN: [id number] Date of Birth: [birth year]

## 2020-10-14 ENCOUNTER — Encounter (HOSPITAL_COMMUNITY): Payer: PRIVATE HEALTH INSURANCE | Admitting: Physical Therapy

## 2020-10-16 ENCOUNTER — Ambulatory Visit (HOSPITAL_COMMUNITY): Payer: PRIVATE HEALTH INSURANCE

## 2020-10-16 ENCOUNTER — Other Ambulatory Visit: Payer: Self-pay

## 2020-10-16 DIAGNOSIS — C50411 Malignant neoplasm of upper-outer quadrant of right female breast: Secondary | ICD-10-CM

## 2020-10-16 DIAGNOSIS — R293 Abnormal posture: Secondary | ICD-10-CM

## 2020-10-16 DIAGNOSIS — Z170 Estrogen receptor positive status [ER+]: Secondary | ICD-10-CM

## 2020-10-16 DIAGNOSIS — Z483 Aftercare following surgery for neoplasm: Secondary | ICD-10-CM

## 2020-10-16 DIAGNOSIS — I890 Lymphedema, not elsewhere classified: Secondary | ICD-10-CM | POA: Diagnosis not present

## 2020-10-16 NOTE — Therapy (Signed)
[place], [place], [address] Phone: [phone]   Fax:  [phone]  Physical Therapy Treatment  Patient Details  Name: Lauren Ortega MRN: [id number] Date of Birth: 12/23/1980 Referring Provider (PT): Dr. Ninfa Linden   Encounter Date: 10/16/2020   PT End of Session - 10/16/20 0846     Visit Number 5    Number of Visits 18    Date for PT Re-Evaluation 11/02/20    PT Start Time 0836    PT Stop Time 0914    PT Time Calculation (min) 38 min    Activity Tolerance Patient tolerated treatment well    Behavior During Therapy [facility] for tasks assessed/performed             Past Medical History:  Diagnosis Date   Anemia    Anxiety    Breast cancer ([place]) 03/2020   Family history of colon cancer    Family history of lymphoma    Family history of pancreatic cancer    Family history of skin cancer    Hypertension    Port-A-Cath in place 06/16/2020    Past Surgical History:  Procedure Laterality Date   BREAST LUMPECTOMY WITH RADIOACTIVE SEED AND SENTINEL LYMPH NODE BIOPSY Right 05/20/2020   Procedure: RIGHT BREAST LUMPECTOMY WITH RADIOACTIVE SEED AND SENTINEL LYMPH NODE BIOPSY;  Surgeon: Coralie Keens, MD;  Location: [facility];  Service: General;  Laterality: Right;   PORTACATH PLACEMENT Left 05/20/2020   Procedure: INSERTION PORT-A-CATH;  Surgeon: Coralie Keens, MD;  Location: [facility];  Service: General;  Laterality: Left;   TONSILLECTOMY     TUBAL LIGATION      There were no vitals filed for this visit.   Subjective Assessment - 10/16/20 0848     Subjective Reports she is feeling good, has been doing self manual at home.    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 Ninfa Linden, HTN, DDD lumbar spine, Developed multiple bloodclots in LUE due to portacath which are now resolved.   Swelling from bloodclots has not resolved.    Patient Stated Goals get information from providers    Currently in Pain? No/denies                   LYMPHEDEMA/ONCOLOGY QUESTIONNAIRE - 10/16/20 0001       Right Upper Extremity Lymphedema   15 cm Proximal to Olecranon Process 28 cm   initial eval on 09/21/20 was: 30   10 cm Proximal to Olecranon Process 26.8 cm   initial eval on 09/21/20 was: 27.1   Olecranon Process 22.6 cm   initial eval on 09/21/20 was: 23.3   15 cm Proximal to Ulnar Styloid Process 21.8 cm   initial eval on 09/21/20 was: 22.3   10 cm Proximal to Ulnar Styloid Process 18.9 cm   initial eval on 09/21/20 was:19.8   Just Proximal to Ulnar Styloid Process 14 cm   initial eval on 09/21/20 was: 14.4   Across Hand at [employer] 17.3 cm   initial eval on 09/21/20 was: 18.1   At [facility] of 2nd Digit 5.6 cm   initial eval on 09/21/20 was: 6     Left Upper Extremity Lymphedema   15 cm Proximal to Olecranon Process 29.2 cm   initial eval on 09/21/20 was: 30.8   10 cm Proximal to Olecranon Process 27.4 cm  initial eval on 09/21/20 was: 29.3   Olecranon Process 23.1 cm   initial eval on 09/21/20 was: 23.1   15 cm Proximal to Ulnar Styloid Process 23.8 cm   initial eval on 09/21/20 was: 23.5   10 cm Proximal to Ulnar Styloid Process 20.2 cm   initial eval on 09/21/20 was: 21.1   Just Proximal to Ulnar Styloid Process 14.6 cm   initial eval on 09/21/20 was: 15.3   Across Hand at [employer] 18.1 cm   initial eval on 09/21/20 was: 18.5   At [place] of 2nd Digit 5.6 cm   initial eval on 09/21/20 was: 5.7                                PT Education - 10/16/20 1013     Education Details Reviewed self massage techniques, encouraged to feel stretch vs press into muscualture    Person(s) Educated Patient    Methods Explanation;Demonstration    Comprehension Verbalized understanding                 PT Long Term Goals - 10/16/20 0905       PT LONG TERM  GOAL #1   Title Pt will be independent is self MLD to reduce Left UE swelling    Baseline 10/16/20: Reports compliance wiht self MLD daily without questions    Status Achieved      PT LONG TERM GOAL #2   Title Pt will have reduction in swelling of left arm by 1-2 cm at 15 cm prox to olecranon and 15 cm prox to ulna    Baseline 10/16/20:  reduced proximal olecranon 1.8, increased proximal ulna    Status On-going      PT LONG TERM GOAL #3   Title Pt will be fit for appropriate compression garments for UE swelling      PT LONG TERM GOAL #4   Title Pt will be independent in self management of lymphedema      PT LONG TERM GOAL #5   Title Pt will note decreased cording at right breast by 50% or greater    Baseline 10/16/20:  Reports decreased cording by 50%, no longer painful in ribcage, does still feel in breast    Status Achieved                   Plan - 10/16/20 1001     Clinical Impression Statement Measurements taken with overall reduction proximal olecranon, some increased proximal ulna.  Reviewed self manual, pt tendency to press too hard, encouraged to feel stretch vs push down into musculature.  Most congestion in subaxillary region, extra time spent in this area.  Reviewed goals, encouraged pt to purchase sleeve.  Pt wishes to continue 2 more session to assure she has correct manual technqiues and assure no increased volume.    Stability/Clinical Decision Making Stable/Uncomplicated    Clinical Decision Making Low    Rehab Potential Good    PT Frequency 3x / week    PT Duration 6 weeks    PT Treatment/Interventions ADLs/Self Care Home Management;Therapeutic exercise;Manual techniques;Orthotic Fit/Training;Patient/family education;Manual lymph drainage;Compression bandaging;Taping    PT Next Visit Plan Measure Wednesday for probable DC.  Review self manual for proper technique.  F/U with Lt UE sleeve.    PT Home Exercise Plan 9/15: self manual, wback, pec strech     Consulted and Agree with Plan of  Care Patient             Patient will benefit from skilled therapeutic intervention in order to improve the following deficits and impairments:  Decreased knowledge of precautions, Increased edema, Decreased strength, Pain, Impaired UE functional use  Visit Diagnosis: Secondary lymphedema  Aftercare following surgery for neoplasm  Abnormal posture  Malignant neoplasm of upper-outer quadrant of right breast in female, estrogen receptor positive ([place])     Problem List Patient Active Problem List   Diagnosis Date Noted   Port-A-Cath in place 06/16/2020   Genetic testing 05/12/2020   Family history of pancreatic cancer    Family history of skin cancer    Family history of lymphoma    Family history of colon cancer    Malignant neoplasm of upper-outer quadrant of right female breast ([place]) 04/16/2020   Ihor Austin, LPTA/CLT; CBIS [phone] Aldona Lento, PTA 10/16/2020, 10:14 AM  [place], [place], [address] Phone: [phone]   Fax:  [phone]  Name: Lauren Ortega MRN: [id number] Date of Birth: 1980-07-09

## 2020-10-17 ENCOUNTER — Other Ambulatory Visit (HOSPITAL_COMMUNITY): Payer: Self-pay | Admitting: Hematology

## 2020-10-18 ENCOUNTER — Encounter (HOSPITAL_COMMUNITY): Payer: Self-pay | Admitting: Hematology

## 2020-10-20 ENCOUNTER — Ambulatory Visit (HOSPITAL_COMMUNITY): Payer: PRIVATE HEALTH INSURANCE

## 2020-10-20 ENCOUNTER — Encounter (HOSPITAL_COMMUNITY): Payer: Self-pay

## 2020-10-20 ENCOUNTER — Other Ambulatory Visit: Payer: Self-pay

## 2020-10-20 DIAGNOSIS — I890 Lymphedema, not elsewhere classified: Secondary | ICD-10-CM | POA: Diagnosis not present

## 2020-10-20 DIAGNOSIS — R293 Abnormal posture: Secondary | ICD-10-CM

## 2020-10-20 DIAGNOSIS — Z483 Aftercare following surgery for neoplasm: Secondary | ICD-10-CM

## 2020-10-20 DIAGNOSIS — C50411 Malignant neoplasm of upper-outer quadrant of right female breast: Secondary | ICD-10-CM

## 2020-10-20 DIAGNOSIS — Z170 Estrogen receptor positive status [ER+]: Secondary | ICD-10-CM

## 2020-10-20 NOTE — Therapy (Signed)
[address] [place], [place], [address] Phone: [phone]   Fax:  [phone]  Physical Therapy Treatment  Patient Details  Name: Lauren Ortega MRN: [id number] Date of Birth: [birth year] Referring Provider (PT): Dr. Ninfa Linden   Encounter Date: 10/20/2020   PT End of Session - 10/20/20 0912     Visit Number 6    Number of Visits 18    Date for PT Re-Evaluation 11/02/20    PT Start Time 0845   late arrival   PT Stop Time 0912    PT Time Calculation (min) 27 min    Activity Tolerance Patient tolerated treatment well    Behavior During Therapy [facility] for tasks assessed/performed             Past Medical History:  Diagnosis Date   Anemia    Anxiety    Breast cancer ([place]) 03/2020   Family history of colon cancer    Family history of lymphoma    Family history of pancreatic cancer    Family history of skin cancer    Hypertension    Port-A-Cath in place 06/16/2020    Past Surgical History:  Procedure Laterality Date   BREAST LUMPECTOMY WITH RADIOACTIVE SEED AND SENTINEL LYMPH NODE BIOPSY Right 05/20/2020   Procedure: RIGHT BREAST LUMPECTOMY WITH RADIOACTIVE SEED AND SENTINEL LYMPH NODE BIOPSY;  Surgeon: Coralie Keens, MD;  Location: [place];  Service: General;  Laterality: Right;   PORTACATH PLACEMENT Left 05/20/2020   Procedure: INSERTION PORT-A-CATH;  Surgeon: Coralie Keens, MD;  Location: [place];  Service: General;  Laterality: Left;   TONSILLECTOMY     TUBAL LIGATION      There were no vitals filed for this visit.   Subjective Assessment - 10/20/20 0848     Subjective Reports she has order sleeve online, continues to do self manual at home.  No reports of pain currently.    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 Ninfa Linden, HTN, DDD lumbar spine, Developed multiple bloodclots  in LUE due to portacath which are now resolved.  Swelling from bloodclots has not resolved.    Patient Stated Goals get information from providers    Currently in Pain? No/denies                               [facility] Adult PT Treatment/Exercise - 10/20/20 0001       Manual Therapy   Manual Therapy Manual Lymphatic Drainage (MLD)    Manual therapy comments Manual complete separate than rest of tx    Manual Lymphatic Drainage (MLD) For Lt UE anterior and posterior in prone postion                          PT Long Term Goals - 10/16/20 0905       PT LONG TERM GOAL #1   Title Pt will be independent is self MLD to reduce Left UE swelling    Baseline 10/16/20: Reports compliance wiht self MLD daily without questions    Status Achieved      PT LONG TERM GOAL #2   Title Pt will have reduction in swelling of left arm by 1-2 cm at 15 cm prox to olecranon and 15 cm prox to ulna    Baseline 10/16/20:  reduced proximal olecranon 1.8, increased proximal ulna    Status On-going      PT LONG TERM GOAL #3   Title Pt will be fit for appropriate compression garments for UE swelling      PT LONG TERM GOAL #4   Title Pt will be independent in self management of lymphedema      PT LONG TERM GOAL #5   Title Pt will note decreased cording at right breast by 50% or greater    Baseline 10/16/20:  Reports decreased cording by 50%, no longer painful in ribcage, does still feel in breast    Status Achieved                   Plan - 10/20/20 0913     Clinical Impression Statement Decreased congestive subaxillary region.  Improved self manual stretching of the skin.  Reports she has ordered sleeve.  Probable DC next session.    Stability/Clinical Decision Making Stable/Uncomplicated    Clinical Decision Making Low    Rehab Potential Good    PT Frequency 3x / week    PT Duration 6 weeks    PT Treatment/Interventions ADLs/Self Care Home Management;Therapeutic  exercise;Manual techniques;Orthotic Fit/Training;Patient/family education;Manual lymph drainage;Compression bandaging;Taping    PT Next Visit Plan Measure Wednesday for probable DC.  Review self manual for proper technique.  F/U with Lt UE sleeve.    PT Home Exercise Plan 9/15: self manual, wback, pec strech    Consulted and Agree with Plan of Care Patient             Patient will benefit from skilled therapeutic intervention in order to improve the following deficits and impairments:  Decreased knowledge of precautions, Increased edema, Decreased strength, Pain, Impaired UE functional use  Visit Diagnosis: Secondary lymphedema  Aftercare following surgery for neoplasm  Abnormal posture  Malignant neoplasm of upper-outer quadrant of right breast in female, estrogen receptor positive ([place])     Problem List Patient Active Problem List   Diagnosis Date Noted   Port-A-Cath in place 06/16/2020   Genetic testing 05/12/2020   Family history of pancreatic cancer    Family history of skin cancer    Family history of lymphoma    Family history of colon cancer    Malignant neoplasm of upper-outer quadrant of right female breast ([place]) 04/16/2020   Ihor Austin, LPTA/CLT; CBIS [phone]  Aldona Lento, PTA 10/20/2020, 9:16 AM  [place], [place], [address] Phone: [phone]   Fax:  [phone]  Name: Lauren Ortega MRN: [id number] Date of Birth: 17-Jan-1981

## 2020-10-21 ENCOUNTER — Ambulatory Visit (HOSPITAL_COMMUNITY): Payer: PRIVATE HEALTH INSURANCE | Admitting: Physical Therapy

## 2020-10-21 DIAGNOSIS — I890 Lymphedema, not elsewhere classified: Secondary | ICD-10-CM

## 2020-10-21 DIAGNOSIS — Z483 Aftercare following surgery for neoplasm: Secondary | ICD-10-CM

## 2020-10-21 NOTE — Therapy (Signed)
[place], [place], [address] Phone: [phone]   Fax:  [phone]  Physical Therapy Treatment  Patient Details  Name: Lauren Ortega MRN: [id number] Date of Birth: [birth year] Referring Provider (PT): Dr. Ninfa Linden PHYSICAL THERAPY DISCHARGE SUMMARY  Visits from Start of Care: 7  Current functional level related to goals / functional outcomes: Majority met see below    Remaining deficits: See below    Education / Equipment: Self massage, to be donning compression sleeve daily    Patient agrees to discharge. Patient goals were partially met. Patient is being discharged due to being pleased with the current functional level.   Encounter Date: 10/21/2020   PT End of Session - 10/21/20 0916     Visit Number 7    Number of Visits 7    Date for PT Re-Evaluation 11/02/20    PT Start Time 0835    PT Stop Time 0910    PT Time Calculation (min) 35 min    Activity Tolerance Patient tolerated treatment well    Behavior During Therapy [facility] for tasks assessed/performed             Past Medical History:  Diagnosis Date   Anemia    Anxiety    Breast cancer ([place]) 03/2020   Family history of colon cancer    Family history of lymphoma    Family history of pancreatic cancer    Family history of skin cancer    Hypertension    Port-A-Cath in place 06/16/2020    Past Surgical History:  Procedure Laterality Date   BREAST LUMPECTOMY WITH RADIOACTIVE SEED AND SENTINEL LYMPH NODE BIOPSY Right 05/20/2020   Procedure: RIGHT BREAST LUMPECTOMY WITH RADIOACTIVE SEED AND SENTINEL LYMPH NODE BIOPSY;  Surgeon: Coralie Keens, MD;  Location: [place];  Service: General;  Laterality: Right;   PORTACATH PLACEMENT Left 05/20/2020   Procedure: INSERTION PORT-A-CATH;  Surgeon: Coralie Keens, MD;  Location: [place];  Service: General;  Laterality: Left;   TONSILLECTOMY     TUBAL LIGATION       There were no vitals filed for this visit.   Subjective Assessment - 10/21/20 0836     Subjective Pt states that she is doing well.  Continues to complete self massage.    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 Ninfa Linden, HTN, DDD lumbar spine, Developed multiple bloodclots in LUE due to portacath which are now resolved.  Swelling from bloodclots has not resolved.                   LYMPHEDEMA/ONCOLOGY QUESTIONNAIRE - 10/21/20 0001       Right Upper Extremity Lymphedema   10 cm Proximal to Olecranon Process --   initial eval on 09/21/20 was: 27.1   Olecranon Process --   initial eval on 09/21/20 was: 23.3   10 cm Proximal to Ulnar Styloid Process --   initial eval on 09/21/20 was:19.8   Just Proximal to Ulnar Styloid Process --   initial eval on 09/21/20 was: 14.4   Across Hand at [employer] --   initial eval on 09/21/20 was: 18.1   At [place] of 2nd Digit --   initial eval on 09/21/20 was: 6     Left Upper Extremity Lymphedema   15 cm Proximal to Olecranon Process 31.8 cm   was 30.8   10  cm Proximal to Olecranon Process 30 cm   was 28.9   Olecranon Process 24 cm   was 23.1   15 cm Proximal to Ulnar Styloid Process 23.9 cm   was 23.5   10 cm Proximal to Ulnar Styloid Process 20.8 cm   was 21.1   Just Proximal to Ulnar Styloid Process 15 cm   was 14.8   Across Hand at [employer] 18.5 cm   was 18.5   At [place] of 2nd Digit 5.9 cm   was 5.7                                PT Education - 10/21/20 0915     Education Details Pt feels I in self massage, has slowly started returning to the gym, understands the need to wear compression garment daily.    Person(s) Educated Patient    Methods Explanation    Comprehension Verbalized understanding                 PT Long Term Goals - 10/16/20 0905       PT LONG TERM GOAL #1   Title Pt will be  independent is self MLD to reduce Left UE swelling    Baseline 10/16/20: Reports compliance wiht self MLD daily without questions    Status Achieved      PT LONG TERM GOAL #2   Title Pt will have reduction in swelling of left arm by 1-2 cm at 15 cm prox to olecranon and 15 cm prox to ulna    Baseline 10/16/20:  reduced proximal olecranon 1.8, increased proximal ulna    Status On-going      PT LONG TERM GOAL #3   Title Pt will be fit for appropriate compression garments for UE swelling      PT LONG TERM GOAL #4   Title Pt will be independent in self management of lymphedema      PT LONG TERM GOAL #5   Title Pt will note decreased cording at right breast by 50% or greater    Baseline 10/16/20:  Reports decreased cording by 50%, no longer painful in ribcage, does still feel in breast    Status Achieved                   Plan - 10/21/20 0916     Clinical Impression Statement Pt has minimal swelling, I in self manual techniques, not ROM deficits and no pain.  PT has ordered a sleeve which should be delivered this week.  Pt ready for discharge.    Stability/Clinical Decision Making Stable/Uncomplicated    Rehab Potential Good    PT Frequency 3x / week    PT Duration 6 weeks    PT Treatment/Interventions ADLs/Self Care Home Management;Therapeutic exercise;Manual techniques;Orthotic Fit/Training;Patient/family education;Manual lymph drainage;Compression bandaging;Taping    PT Next Visit Plan discharge.    PT Home Exercise Plan 9/15: self manual, wback, pec strech    Consulted and Agree with Plan of Care Patient             Patient will benefit from skilled therapeutic intervention in order to improve the following deficits and impairments:  Decreased knowledge of precautions, Increased edema, Decreased strength, Pain, Impaired UE functional use  Visit Diagnosis: Aftercare following surgery for neoplasm  Secondary lymphedema     Problem List Patient Active Problem List    Diagnosis Date Noted  Port-A-Cath in place 06/16/2020   Genetic testing 05/12/2020   Family history of pancreatic cancer    Family history of skin cancer    Family history of lymphoma    Family history of colon cancer    Malignant neoplasm of upper-outer quadrant of right female breast [facility]) 04/16/2020   Rayetta Humphrey, PT CLT [phone] 10/21/2020, 9:18 AM  [place], [place], [address] Phone: [phone]   Fax:  [phone]  Name: Lauren Ortega MRN: [id number] Date of Birth: [birth year]

## 2020-10-23 ENCOUNTER — Encounter (HOSPITAL_COMMUNITY): Payer: PRIVATE HEALTH INSURANCE

## 2020-10-26 ENCOUNTER — Encounter (HOSPITAL_COMMUNITY): Payer: PRIVATE HEALTH INSURANCE | Admitting: Physical Therapy

## 2020-10-27 NOTE — Progress Notes (Signed)
[place] [place], [place] [address]   CLINIC:  Medical Oncology/Hematology  PCP:  [name], [place] [address] [place] [address] [phone]   REASON FOR VISIT:  Follow-up for right breast cancer  PRIOR THERAPY: none  NGS Results: Her2+  CURRENT THERAPY:  Docetaxel + Carboplatin + Trastuzumab (TCH) q21d / Trastuzumab q21d  BRIEF ONCOLOGIC HISTORY:  Oncology History  Malignant neoplasm of upper-outer quadrant of right female breast ([place])  04/16/2020 Initial Diagnosis   Malignant neoplasm of upper-outer quadrant of right female breast [facility])    Genetic Testing   Negative genetic testing. No pathogenic variants identified on the [facility] Multi-Cancer Panel+RNA. The report date is 05/10/2020.  The Multi-Cancer Panel + RNA offered by [facility] includes sequencing and/or deletion duplication testing of the following 84 genes: AIP, ALK, APC, ATM, AXIN2,BAP1,  BARD1, BLM, BMPR1A, BRCA1, BRCA2, BRIP1, CASR, CDC73, CDH1, CDK4, CDKN1B, CDKN1C, CDKN2A (p14ARF), CDKN2A (p16INK4a), CEBPA, CHEK2, CTNNA1, DICER1, DIS3L2, EGFR (c.2369C>T, p.Thr790Met variant only), EPCAM (Deletion/duplication testing only), FH, FLCN, GATA2, GPC3, GREM1 (Promoter region deletion/duplication testing only), HOXB13 (c.251G>A, p.Gly84Glu), HRAS, KIT, MAX, MEN1, MET, MITF (c.952G>A, p.Glu318Lys variant only), MLH1, MSH2, MSH3, MSH6, MUTYH, NBN, NF1, NF2, NTHL1, PALB2, PDGFRA, PHOX2B, PMS2, POLD1, POLE, POT1, PRKAR1A, PTCH1, PTEN, RAD50, RAD51C, RAD51D, RB1, RECQL4, RET, RUNX1, SDHAF2, SDHA (sequence changes only), SDHB, SDHC, SDHD, SMAD4, SMARCA4, SMARCB1, SMARCE1, STK11, SUFU, TERC, TERT, TMEM127, TP53, TSC1, TSC2, VHL, WRN and WT1.   06/23/2020 -  Chemotherapy    Patient is on Treatment Plan: BREAST DOCETAXEL + CARBOPLATIN + TRASTUZUMAB ([place]) Q21D / TRASTUZUMAB Q21D         CANCER STAGING: Cancer Staging Malignant neoplasm of upper-outer quadrant of right female breast  ([place]) Staging form: Breast, AJCC 8th Edition - Clinical stage from 04/21/2020: Stage IA (cT1c, cN0, cM0, G3, ER+, PR-, HER2+) - Unsigned   INTERVAL HISTORY:  Ms. Baila Rouse, a 40 y.o. female, returns for routine follow-up and consideration for next cycle of chemotherapy. Arriah was last seen on 10/07/2020.  Due for cycle #7 of Herceptin today.   Overall, she tells me she has been feeling pretty well. She reports tingling, shooting pain in her legs bilaterally upon stretching accompanied by aching pain in the left leg, easy bruising, and swelling which is worse in the evening. She denies tingling in her fingertips. She denies vaginal bleeding, recent menses, nausea, and vomiting. She has been taking iron and magnesium. She reports hot flashes.   Overall, she feels ready for next cycle of chemo today.   REVIEW OF SYSTEMS:  Review of Systems  Constitutional:  Negative for appetite change and fatigue.  Cardiovascular:  Positive for leg swelling.  Gastrointestinal:  Negative for nausea and vomiting.  Endocrine: Positive for hot flashes.  Genitourinary:  Negative for vaginal bleeding.   Musculoskeletal:  Positive for myalgias (4/10 L calf pain).  Neurological:  Positive for numbness (bilateral legs).  Hematological:  Bruises/bleeds easily.  All other systems reviewed and are negative.  PAST MEDICAL/SURGICAL HISTORY:  Past Medical History:  Diagnosis Date   Anemia    Anxiety    Breast cancer ([place]) 03/2020   Family history of colon cancer    Family history of lymphoma    Family history of pancreatic cancer    Family history of skin cancer    Hypertension    Port-A-Cath in place 06/16/2020   Past Surgical History:  Procedure Laterality Date   BREAST LUMPECTOMY WITH RADIOACTIVE SEED  AND SENTINEL LYMPH NODE BIOPSY Right 05/20/2020   Procedure: RIGHT BREAST LUMPECTOMY WITH RADIOACTIVE SEED AND SENTINEL LYMPH NODE BIOPSY;  Surgeon: Coralie Keens, MD;  Location: [facility];   Service: General;  Laterality: Right;   PORTACATH PLACEMENT Left 05/20/2020   Procedure: INSERTION PORT-A-CATH;  Surgeon: Coralie Keens, MD;  Location: [facility];  Service: General;  Laterality: Left;   TONSILLECTOMY     TUBAL LIGATION      SOCIAL HISTORY:  Social History   Socioeconomic History   Marital status: Divorced    Spouse name: Not on file   Number of children: Not on file   Years of education: Not on file   Highest education level: Not on file  Occupational History   Not on file  Tobacco Use   Smoking status: Never   Smokeless tobacco: Never  Substance and Sexual Activity   Alcohol use: Yes    Comment: occassionally   Drug use: Never   Sexual activity: Yes    Birth control/protection: Surgical  Other Topics Concern   Not on file  Social History Narrative   Not on file   Social Determinants of Health   Financial Resource Strain: Low Risk    Difficulty of Paying Living Expenses: Not hard at all  Food Insecurity: No Food Insecurity   Worried About Charity fundraiser in the Last Year: Never true   Ran Out of Food in the Last Year: Never true  Transportation Needs: No Transportation Needs   Lack of Transportation (Medical): No   Lack of Transportation (Non-Medical): No  Physical Activity: Insufficiently Active   Days of Exercise per Week: 2 days   Minutes of Exercise per Session: 20 min  Stress: No Stress Concern Present   Feeling of Stress : Not at all  Social Connections: Moderately Integrated   Frequency of Communication with Friends and Family: More than three times a week   Frequency of Social Gatherings with Friends and Family: More than three times a week   Attends Religious Services: 1 to 4 times per year   Active Member of [employer] or Organizations: No   Attends Music therapist: 1 to 4 times per year   Marital Status: Divorced  Human resources officer Violence: Not At Risk   Fear of Current or Ex-Partner: No   Emotionally  Abused: No   Physically Abused: No   Sexually Abused: No    FAMILY HISTORY:  Family History  Problem Relation Age of Onset   Basal cell carcinoma Mother 73   Bladder Cancer Maternal Uncle 72   Lymphoma Maternal Uncle 54       [place]s    Pancreatic cancer Maternal Grandmother    Non-Hodgkin's lymphoma Maternal Grandfather    Lung cancer Paternal Grandfather    Colon cancer Other        x8 or 9   Colon cancer Maternal Great-grandfather     CURRENT MEDICATIONS:  Current Outpatient Medications  Medication Sig Dispense Refill   amLODipine (NORVASC) 10 MG tablet Take 10 mg by mouth daily.     apixaban (ELIQUIS) 5 MG TABS tablet Take 1 tablet (5 mg total) by mouth 2 (two) times daily. 60 tablet 3   Baclofen 5 MG TABS Take 5 mg by mouth 3 (three) times daily as needed. 30 tablet 0   CARBOPLATIN IV Inject into the vein every 21 ( twenty-one) days.     diphenoxylate-atropine (LOMOTIL) 2.5-0.025 MG tablet Take 2 tablets by  mouth 4 (four) times daily as needed for diarrhea or loose stools. 30 tablet 3   DOCETAXEL IV Inject into the vein every 21 ( twenty-one) days.     FERREX 150 150 MG capsule Take 150 mg by mouth daily.     folic acid (FOLVITE) 1 MG tablet Take by mouth.     lidocaine-prilocaine (EMLA) cream Apply a small amount to port a cath site and cover with plastic wrap 1 hour prior to infusion appointments (Patient not taking: No sig reported) 30 g 3   magnesium oxide (MAG-OX) 400 (240 Mg) MG tablet TAKE 1 TABLET BY MOUTH IN THE MORNING AND IN THE EVENING 60 tablet 5   meloxicam (MOBIC) 15 MG tablet Take 15 mg by mouth daily as needed.     oxyCODONE (OXY IR/ROXICODONE) 5 MG immediate release tablet Take 1 tablet (5 mg total) by mouth every 6 (six) hours as needed for moderate pain or severe pain. 25 tablet 0   prochlorperazine (COMPAZINE) 10 MG tablet Take 1 tablet (10 mg total) by mouth every 6 (six) hours as needed (Nausea or vomiting). (Patient not taking: No sig reported) 30  tablet 1   spironolactone (ALDACTONE) 50 MG tablet Take 50 mg by mouth every morning.     temazepam (RESTORIL) 15 MG capsule Take 1 capsule (15 mg total) by mouth at bedtime as needed for sleep. 30 capsule 0   trastuzumab in sodium chloride 0.9 % 250 mL Inject into the vein every 21 ( twenty-one) days.     vitamin B-12 (CYANOCOBALAMIN) 1000 MCG tablet Take 1 tablet (1,000 mcg total) by mouth daily. 90 tablet 1   No current facility-administered medications for this visit.    ALLERGIES:  No Known Allergies  PHYSICAL EXAM:  Performance status (ECOG): 0 - Asymptomatic  There were no vitals filed for this visit. Wt Readings from Last 3 Encounters:  10/07/20 138 lb 6.4 oz (62.8 kg)  09/16/20 132 lb 6.4 oz (60.1 kg)  08/26/20 134 lb 14.4 oz (61.2 kg)   Physical Exam Vitals reviewed.  Constitutional:      Appearance: Normal appearance.  Cardiovascular:     Rate and Rhythm: Normal rate and regular rhythm.     Pulses: Normal pulses.     Heart sounds: Normal heart sounds.  Pulmonary:     Effort: Pulmonary effort is normal.     Breath sounds: Normal breath sounds.  Musculoskeletal:     Right lower leg: Edema (trace) present.     Left lower leg: Edema (trace) present.  Neurological:     General: No focal deficit present.     Mental Status: She is alert and oriented to person, place, and time.  Psychiatric:        Mood and Affect: Mood normal.        Behavior: Behavior normal.    LABORATORY DATA:  I have reviewed the labs as listed.  CBC Latest Ref Rng & Units 10/07/2020 09/16/2020 08/26/2020  WBC 4.0 - 10.5 K/uL 5.7 4.9 4.6  Hemoglobin 12.0 - 15.0 g/dL 9.6(L) 10.2(L) 10.1(L)  Hematocrit 36.0 - 46.0 % 29.1(L) 30.8(L) 31.3(L)  Platelets 150 - 400 K/uL 155 194 341   CMP Latest Ref Rng & Units 10/07/2020 09/16/2020 08/26/2020  Glucose 70 - 99 mg/dL 98 123(H) 81  BUN 6 - 20 mg/dL _0 Creatinine 0.44 - 1.00 mg/dL 0.63 0.71 0.66  Sodium 135 - 145 mmol/L 137 137 139  Potassium 3.5 -  5.1 mmol/L  3.8 4.1 3.8  Chloride 98 - 111 mmol/L 104 104 105  CO2 22 - 32 mmol/L _0 Calcium 8.9 - 10.3 mg/dL 9.1 8.9 9.1  Total Protein 6.5 - 8.1 g/dL 6.8 7.1 7.0  Total Bilirubin 0.3 - 1.2 mg/dL 0.5 0.3 0.7  Alkaline Phos 38 - 126 U/L 67 67 75  AST 15 - 41 U/L 29 29 32  ALT 0 - 44 U/L _1 DIAGNOSTIC IMAGING:  I have independently reviewed the scans and discussed with the patient. No results found.   ASSESSMENT:  1.  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  She had mammogram/ultrasound on 03/18/2020 done in [place] which showed a 1.4 x 0.9 x 1.5 cm mass in the upper outer quadrant of the right breast. -Biopsy on 03/31/2020 consistent with intermediate to high-grade infiltrating ductal carcinoma of the right breast at 10:30 position, HER-2 3+ positive, Ki-67 40%, ER weak staining in less than 10% of tumor cells, PR negative. -MRI of the breast on 04/20/2020 with 1.3 x 1.5 x 1.4 cm irregular enhancing mass within the upper outer right breast.  No abnormal appearing lymph nodes. - Right breast lumpectomy and SLNB on 05/20/2020- grade 3 IDC, 1.8 cm, invasive carcinoma is less than 1 mm from anterior margin focally and less than 1 mm from the posterior margin broadly.  Margins negative for in situ carcinoma.  Lymphovascular invasion present.  0/5 lymph nodes positive.  There is focal ductal carcinoma within the vascular space in the soft tissue adjacent to the lymph node.  No carcinoma is seen within the lymph nodes.  PT1CPN0. - Genetic testing was negative. - 6 cycles of [place] from 06/23/2020 through 10/07/2020.   2.  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  Never smoker. -Maternal grandmother had colon cancer.  Maternal grandfather died of non-Hodgkin's lymphoma.  Paternal grandfather died of lung cancer.  Maternal uncle had  multiple myeloma.  Mother had basal cell skin cancer.  3.  Left upper extremity DVT: - Doppler on 07/06/2020 positive for acute DVT in the left axillary, peripheral subclavian vein and superficial thrombosis involving left basilic vein. - This is port induced and she was started on Eliquis.   PLAN:  1.  Stage I (T1 cN0 M0) right breast IDC, HER2 positive: - She has completed cycle 6 on 10/07/2020 and tolerated it better than cycle 5. - Reviewed labs from today which showed normal LFTs and electrolytes.  Renal function is normal.  CBC shows normal white count and platelet count but with anemia from myelosuppression. - Recommend proceeding with Herceptin today and in 3 weeks. - Recommend XRT.  We have made a referral to [facility] oncology. - We talked about initiating her on tamoxifen daily for 5 years even though ER staining was weak.  PR was negative.  She did not have any menstrual period since 07/07/2020. - We talked about side effects of tamoxifen in detail.  She was recommended to have a Pap smear every year. - We have sent a prescription to her pharmacy.  RTC 6 weeks for follow-up.   2.  High risk drug monitoring: - No clinical signs or symptoms of PND or orthopnea. - 2D echo on 08/18/2020 with EF 50-55% and prior echo 55-60%. - We will plan to repeat 2D echo in 3 weeks.  3.  Normocytic anemia: - Continue iron tablet daily.  Hemoglobin is 9.4 from myelosuppression.  4.  Left upper extremity DVT: - Recent Doppler on 09/10/2020 did not show any evidence of DVT. - Continue Eliquis twice daily. - She has some easy bruising on the legs but no bleeding.  5.  Hypomagnesemia: - Magnesium is 1.8.  Continue magnesium twice daily.  6.  Peripheral neuropathy: - She felt some aching sensation in the left leg which is more prominent lately. - She also felt a pins-and-needles in both legs lasting few seconds particularly when she stretches her legs.  No continuous numbness or  tingling.   Orders placed this encounter:  No orders of the defined types were placed in this encounter.    Derek Jack, MD [place] [phone]   I, Thana Ates, am acting as a scribe for Dr. Derek Jack.  I, Derek Jack MD, have reviewed the above documentation for accuracy and completeness, and I agree with the above.

## 2020-10-28 ENCOUNTER — Inpatient Hospital Stay (HOSPITAL_BASED_OUTPATIENT_CLINIC_OR_DEPARTMENT_OTHER): Payer: PRIVATE HEALTH INSURANCE | Admitting: Hematology

## 2020-10-28 ENCOUNTER — Other Ambulatory Visit: Payer: Self-pay

## 2020-10-28 ENCOUNTER — Inpatient Hospital Stay (HOSPITAL_COMMUNITY): Payer: PRIVATE HEALTH INSURANCE

## 2020-10-28 ENCOUNTER — Inpatient Hospital Stay (HOSPITAL_COMMUNITY): Payer: PRIVATE HEALTH INSURANCE | Attending: Hematology

## 2020-10-28 VITALS — BP 114/69 | HR 93 | Temp 97.0°F | Resp 18 | Wt 136.7 lb

## 2020-10-28 VITALS — BP 108/61 | HR 78 | Temp 98.1°F | Resp 18

## 2020-10-28 DIAGNOSIS — Z09 Encounter for follow-up examination after completed treatment for conditions other than malignant neoplasm: Secondary | ICD-10-CM

## 2020-10-28 DIAGNOSIS — Z170 Estrogen receptor positive status [ER+]: Secondary | ICD-10-CM

## 2020-10-28 DIAGNOSIS — Z79899 Other long term (current) drug therapy: Secondary | ICD-10-CM | POA: Insufficient documentation

## 2020-10-28 DIAGNOSIS — Z7901 Long term (current) use of anticoagulants: Secondary | ICD-10-CM | POA: Diagnosis not present

## 2020-10-28 DIAGNOSIS — Z86718 Personal history of other venous thrombosis and embolism: Secondary | ICD-10-CM | POA: Diagnosis not present

## 2020-10-28 DIAGNOSIS — C50411 Malignant neoplasm of upper-outer quadrant of right female breast: Secondary | ICD-10-CM

## 2020-10-28 DIAGNOSIS — Z95828 Presence of other vascular implants and grafts: Secondary | ICD-10-CM

## 2020-10-28 DIAGNOSIS — Z5112 Encounter for antineoplastic immunotherapy: Secondary | ICD-10-CM | POA: Insufficient documentation

## 2020-10-28 LAB — COMPREHENSIVE METABOLIC PANEL
ALT: 27 U/L (ref 0–44)
AST: 27 U/L (ref 15–41)
Albumin: 3.8 g/dL (ref 3.5–5.0)
Alkaline Phosphatase: 71 U/L (ref 38–126)
Anion gap: 6 (ref 5–15)
BUN: 15 mg/dL (ref 6–20)
CO2: 26 mmol/L (ref 22–32)
Calcium: 9 mg/dL (ref 8.9–10.3)
Chloride: 104 mmol/L (ref 98–111)
Creatinine, Ser: 0.7 mg/dL (ref 0.44–1.00)
GFR, Estimated: >60 mL/min (ref >60)
Glucose, Bld: 100 mg/dL — ABNORMAL HIGH (ref 70–99)
Potassium: 3.9 mmol/L (ref 3.5–5.1)
Sodium: 136 mmol/L (ref 135–145)
Total Bilirubin: 0.7 mg/dL (ref 0.3–1.2)
Total Protein: 6.7 g/dL (ref 6.5–8.1)

## 2020-10-28 LAB — CBC WITH DIFFERENTIAL/PLATELET
Abs Immature Granulocytes: 0.02 10*3/uL (ref 0.00–0.07)
Basophils Absolute: 0 10*3/uL (ref 0.0–0.1)
Basophils Relative: 1 %
Eosinophils Absolute: 0 10*3/uL (ref 0.0–0.5)
Eosinophils Relative: 1 %
HCT: 28.2 % — ABNORMAL LOW (ref 36.0–46.0)
Hemoglobin: 9.4 g/dL — ABNORMAL LOW (ref 12.0–15.0)
Immature Granulocytes: 1 %
Lymphocytes Relative: 20 %
Lymphs Abs: 0.9 10*3/uL (ref 0.7–4.0)
MCH: 37.5 pg — ABNORMAL HIGH (ref 26.0–34.0)
MCHC: 33.3 g/dL (ref 30.0–36.0)
MCV: 112.4 fL — ABNORMAL HIGH (ref 80.0–100.0)
Monocytes Absolute: 0.8 10*3/uL (ref 0.1–1.0)
Monocytes Relative: 17 %
Neutro Abs: 2.7 10*3/uL (ref 1.7–7.7)
Neutrophils Relative %: 60 %
Platelets: 150 10*3/uL (ref 150–400)
RBC: 2.51 MIL/uL — ABNORMAL LOW (ref 3.87–5.11)
RDW: 18.4 % — ABNORMAL HIGH (ref 11.5–15.5)
WBC: 4.4 10*3/uL (ref 4.0–10.5)
nRBC: 0 % (ref 0.0–0.2)

## 2020-10-28 LAB — MAGNESIUM: Magnesium: 1.8 mg/dL (ref 1.7–2.4)

## 2020-10-28 MED ORDER — TRASTUZUMAB-DKST CHEMO 150 MG IV SOLR
6.0000 mg/kg | Freq: Once | INTRAVENOUS | Status: AC
  Administered 2020-10-28: 378 mg via INTRAVENOUS
  Filled 2020-10-28: qty 18

## 2020-10-28 MED ORDER — TAMOXIFEN CITRATE 20 MG PO TABS: 20.0000 mg | ORAL_TABLET | Freq: Every day | ORAL | 5 refills | Status: DC

## 2020-10-28 MED ORDER — DIPHENHYDRAMINE HCL 25 MG PO CAPS
50.0000 mg | ORAL_CAPSULE | Freq: Once | ORAL | Status: AC
  Administered 2020-10-28: 50 mg via ORAL
  Filled 2020-10-28: qty 2

## 2020-10-28 MED ORDER — SODIUM CHLORIDE 0.9 % IV SOLN: Freq: Once | INTRAVENOUS | Status: AC

## 2020-10-28 MED ORDER — ACETAMINOPHEN 325 MG PO TABS
650.0000 mg | ORAL_TABLET | Freq: Once | ORAL | Status: AC
  Administered 2020-10-28: 650 mg via ORAL
  Filled 2020-10-28: qty 2

## 2020-10-28 MED ORDER — HEPARIN SOD (PORK) LOCK FLUSH 100 UNIT/ML IV SOLN
500.0000 [IU] | Freq: Once | INTRAVENOUS | Status: AC | PRN
Start: 2020-10-28 — End: 2020-10-28
  Administered 2020-10-28: 500 [IU]

## 2020-10-28 MED ORDER — SODIUM CHLORIDE 0.9% FLUSH
10.0000 mL | INTRAVENOUS | Status: DC | PRN
  Administered 2020-10-28: 10 mL

## 2020-10-28 NOTE — Patient Instructions (Signed)
[facility]  Discharge Instructions: Thank you for choosing [facility] to provide your oncology and hematology care.  If you have a lab appointment with the [facility], please come in thru the Main Entrance and check in at the main information desk.  Wear comfortable clothing and clothing appropriate for easy access to any Portacath or PICC line.   We strive to give you quality time with your provider. You may need to reschedule your appointment if you arrive late (15 or more minutes).  Arriving late affects you and other patients whose appointments are after yours.  Also, if you miss three or more appointments without notifying the office, you may be dismissed from the clinic at the provider's discretion.      For prescription refill requests, have your pharmacy contact our office and allow 72 hours for refills to be completed.    Today you received the following chemotherapy and/or immunotherapy agents Ogivir. Return as scheduled.    To help prevent nausea and vomiting after your treatment, we encourage you to take your nausea medication as directed.  BELOW ARE SYMPTOMS THAT SHOULD BE REPORTED IMMEDIATELY: *FEVER GREATER THAN 100.4 F (38 C) OR HIGHER *CHILLS OR SWEATING *NAUSEA AND VOMITING THAT IS NOT CONTROLLED WITH YOUR NAUSEA MEDICATION *UNUSUAL SHORTNESS OF BREATH *UNUSUAL BRUISING OR BLEEDING *URINARY PROBLEMS (pain or burning when urinating, or frequent urination) *BOWEL PROBLEMS (unusual diarrhea, constipation, pain near the anus) TENDERNESS IN MOUTH AND THROAT WITH OR WITHOUT PRESENCE OF ULCERS (sore throat, sores in mouth, or a toothache) UNUSUAL RASH, SWELLING OR PAIN  UNUSUAL VAGINAL DISCHARGE OR ITCHING   Items with * indicate a potential emergency and should be followed up as soon as possible or go to the Emergency Department if any problems should occur.  Please show the CHEMOTHERAPY ALERT CARD or IMMUNOTHERAPY ALERT CARD at check-in to the  Emergency Department and triage nurse.  Should you have questions after your visit or need to cancel or reschedule your appointment, please contact [facility] [phone]  and follow the prompts.  Office hours are 8:00 a.m. to 4:30 p.m. Monday - Friday. Please note that voicemails left after 4:00 p.m. may not be returned until the following business day.  We are closed weekends and major holidays. You have access to a nurse at all times for urgent questions. Please call the main number to the clinic [phone] and follow the prompts.  For any non-urgent questions, you may also contact your provider using MyChart. We now offer e-Visits for anyone 53 and older to request care online for non-urgent symptoms. For details visit [email or web address].   Also download the MyChart app! Go to the app store, search "MyChart", open the app, select [facility], and log in with your MyChart username and password.  Due to Covid, a mask is required upon entering the hospital/clinic. If you do not have a mask, one will be given to you upon arrival. For doctor visits, patients may have 1 support person aged 68 or older with them. For treatment visits, patients cannot have anyone with them due to current Covid guidelines and our immunocompromised population.

## 2020-10-28 NOTE — Progress Notes (Signed)
Patient has been examined, vital signs and labs have been reviewed by Dr. Katragadda. ANC, Creatinine, LFTs, hemoglobin, and platelets are within treatment parameters per Dr. Katragadda. Patient may proceed with treatment per M.D.   

## 2020-10-28 NOTE — Progress Notes (Signed)
Patient tolerated Ogivir with no complaints voiced. Side effects with management reviewed with understanding verbalized. Port site clean and dry with no bruising or swelling noted at site. Good blood return noted before and after administration of therapy. Band aid applied. Patient left in satisfactory condition with VSS and no s/s of distress noted.

## 2020-10-28 NOTE — Patient Instructions (Signed)
[facility] at [facility] Discharge Instructions  You were seen and examined today by Dr. Delton Coombes. You received trastuzumab infusion. Return to clinic as scheduled for office visit and treatment.    Thank you for choosing [facility] at [facility] to provide your oncology and hematology care.  To afford each patient quality time with our provider, please arrive at least 15 minutes before your scheduled appointment time.   If you have a lab appointment with the [facility] please come in thru the Main Entrance and check in at the main information desk.  You need to re-schedule your appointment should you arrive 10 or more minutes late.  We strive to give you quality time with our providers, and arriving late affects you and other patients whose appointments are after yours.  Also, if you no show three or more times for appointments you may be dismissed from the clinic at the providers discretion.     Again, thank you for choosing [facility].  Our hope is that these requests will decrease the amount of time that you wait before being seen by our physicians.       _____________________________________________________________  Should you have questions after your visit to [facility], please contact our office at [phone] and follow the prompts.  Our office hours are 8:00 a.m. and 4:30 p.m. Monday - Friday.  Please note that voicemails left after 4:00 p.m. may not be returned until the following business day.  We are closed weekends and major holidays.  You do have access to a nurse 24-7, just call the main number to the clinic [phone] and do not press any options, hold on the line and a nurse will answer the phone.    For prescription refill requests, have your pharmacy contact our office and allow 72 hours.    Due to Covid, you will need to wear a mask upon entering the hospital. If you do not have a mask, a  mask will be given to you at the Main Entrance upon arrival. For doctor visits, patients may have 1 support person age 32 or older with them. For treatment visits, patients can not have anyone with them due to social distancing guidelines and our immunocompromised population.

## 2020-10-29 ENCOUNTER — Encounter (HOSPITAL_COMMUNITY): Payer: PRIVATE HEALTH INSURANCE | Admitting: Physical Therapy

## 2020-10-29 ENCOUNTER — Encounter (HOSPITAL_COMMUNITY): Payer: Self-pay | Admitting: Lab

## 2020-10-29 NOTE — Progress Notes (Unsigned)
Referral to [facility].  Referral sent on 10/6

## 2020-10-30 ENCOUNTER — Encounter (HOSPITAL_COMMUNITY): Payer: PRIVATE HEALTH INSURANCE | Admitting: Physical Therapy

## 2020-10-30 ENCOUNTER — Ambulatory Visit (HOSPITAL_COMMUNITY): Payer: PRIVATE HEALTH INSURANCE

## 2020-11-02 ENCOUNTER — Encounter (HOSPITAL_COMMUNITY): Payer: PRIVATE HEALTH INSURANCE | Admitting: Physical Therapy

## 2020-11-04 ENCOUNTER — Encounter (HOSPITAL_COMMUNITY): Payer: PRIVATE HEALTH INSURANCE | Admitting: Physical Therapy

## 2020-11-06 ENCOUNTER — Other Ambulatory Visit (HOSPITAL_COMMUNITY): Payer: Self-pay | Admitting: Hematology

## 2020-11-07 ENCOUNTER — Other Ambulatory Visit (HOSPITAL_COMMUNITY): Payer: Self-pay

## 2020-11-09 ENCOUNTER — Other Ambulatory Visit (HOSPITAL_COMMUNITY): Payer: Self-pay

## 2020-11-09 DIAGNOSIS — Z170 Estrogen receptor positive status [ER+]: Secondary | ICD-10-CM

## 2020-11-09 MED ORDER — TEMAZEPAM 15 MG PO CAPS: 15.0000 mg | ORAL_CAPSULE | Freq: Every evening | ORAL | 2 refills | Status: DC | PRN

## 2020-11-09 NOTE — Telephone Encounter (Signed)
Refill request for Temazepam.  Chart checked and refill request forwarded to oncologist.  Next appointment scheduled for November 18, 2020.

## 2020-11-18 ENCOUNTER — Inpatient Hospital Stay (HOSPITAL_COMMUNITY): Payer: PRIVATE HEALTH INSURANCE

## 2020-11-18 ENCOUNTER — Encounter (HOSPITAL_COMMUNITY): Payer: Self-pay

## 2020-11-18 ENCOUNTER — Other Ambulatory Visit (HOSPITAL_COMMUNITY): Payer: PRIVATE HEALTH INSURANCE

## 2020-11-18 ENCOUNTER — Ambulatory Visit (HOSPITAL_COMMUNITY): Payer: PRIVATE HEALTH INSURANCE

## 2020-11-18 ENCOUNTER — Other Ambulatory Visit: Payer: Self-pay

## 2020-11-18 VITALS — BP 108/72 | HR 71 | Temp 98.0°F | Resp 18

## 2020-11-18 DIAGNOSIS — Z95828 Presence of other vascular implants and grafts: Secondary | ICD-10-CM

## 2020-11-18 DIAGNOSIS — Z170 Estrogen receptor positive status [ER+]: Secondary | ICD-10-CM

## 2020-11-18 DIAGNOSIS — C50411 Malignant neoplasm of upper-outer quadrant of right female breast: Secondary | ICD-10-CM

## 2020-11-18 DIAGNOSIS — Z5112 Encounter for antineoplastic immunotherapy: Secondary | ICD-10-CM | POA: Diagnosis not present

## 2020-11-18 LAB — COMPREHENSIVE METABOLIC PANEL
ALT: 16 U/L (ref 0–44)
AST: 22 U/L (ref 15–41)
Albumin: 4 g/dL (ref 3.5–5.0)
Alkaline Phosphatase: 52 U/L (ref 38–126)
Anion gap: 6 (ref 5–15)
BUN: 16 mg/dL (ref 6–20)
CO2: 27 mmol/L (ref 22–32)
Calcium: 9.3 mg/dL (ref 8.9–10.3)
Chloride: 103 mmol/L (ref 98–111)
Creatinine, Ser: 0.74 mg/dL (ref 0.44–1.00)
GFR, Estimated: >60 mL/min (ref >60)
Glucose, Bld: 98 mg/dL (ref 70–99)
Potassium: 3.7 mmol/L (ref 3.5–5.1)
Sodium: 136 mmol/L (ref 135–145)
Total Bilirubin: 0.5 mg/dL (ref 0.3–1.2)
Total Protein: 7.3 g/dL (ref 6.5–8.1)

## 2020-11-18 LAB — CBC WITH DIFFERENTIAL/PLATELET
Abs Immature Granulocytes: 0.02 10*3/uL (ref 0.00–0.07)
Basophils Absolute: 0.1 10*3/uL (ref 0.0–0.1)
Basophils Relative: 1 %
Eosinophils Absolute: 0.2 10*3/uL (ref 0.0–0.5)
Eosinophils Relative: 3 %
HCT: 35.2 % — ABNORMAL LOW (ref 36.0–46.0)
Hemoglobin: 11.9 g/dL — ABNORMAL LOW (ref 12.0–15.0)
Immature Granulocytes: 0 %
Lymphocytes Relative: 23 %
Lymphs Abs: 1.3 10*3/uL (ref 0.7–4.0)
MCH: 36.5 pg — ABNORMAL HIGH (ref 26.0–34.0)
MCHC: 33.8 g/dL (ref 30.0–36.0)
MCV: 108 fL — ABNORMAL HIGH (ref 80.0–100.0)
Monocytes Absolute: 0.7 10*3/uL (ref 0.1–1.0)
Monocytes Relative: 12 %
Neutro Abs: 3.4 10*3/uL (ref 1.7–7.7)
Neutrophils Relative %: 61 %
Platelets: 320 10*3/uL (ref 150–400)
RBC: 3.26 MIL/uL — ABNORMAL LOW (ref 3.87–5.11)
RDW: 13.5 % (ref 11.5–15.5)
WBC: 5.6 10*3/uL (ref 4.0–10.5)
nRBC: 0 % (ref 0.0–0.2)

## 2020-11-18 LAB — MAGNESIUM: Magnesium: 1.8 mg/dL (ref 1.7–2.4)

## 2020-11-18 MED ORDER — ACETAMINOPHEN 325 MG PO TABS
650.0000 mg | ORAL_TABLET | Freq: Once | ORAL | Status: AC
  Administered 2020-11-18: 650 mg via ORAL
  Filled 2020-11-18: qty 2

## 2020-11-18 MED ORDER — TRASTUZUMAB-DKST CHEMO 150 MG IV SOLR
6.0000 mg/kg | Freq: Once | INTRAVENOUS | Status: AC
  Administered 2020-11-18: 378 mg via INTRAVENOUS
  Filled 2020-11-18: qty 18

## 2020-11-18 MED ORDER — SODIUM CHLORIDE 0.9% FLUSH
10.0000 mL | INTRAVENOUS | Status: DC | PRN
  Administered 2020-11-18: 10 mL

## 2020-11-18 MED ORDER — HEPARIN SOD (PORK) LOCK FLUSH 100 UNIT/ML IV SOLN
500.0000 [IU] | Freq: Once | INTRAVENOUS | Status: AC | PRN
  Administered 2020-11-18: 500 [IU]

## 2020-11-18 MED ORDER — SODIUM CHLORIDE 0.9 % IV SOLN: Freq: Once | INTRAVENOUS | Status: AC

## 2020-11-18 MED ORDER — DIPHENHYDRAMINE HCL 25 MG PO CAPS
50.0000 mg | ORAL_CAPSULE | Freq: Once | ORAL | Status: AC
  Administered 2020-11-18: 50 mg via ORAL
  Filled 2020-11-18: qty 2

## 2020-11-18 NOTE — Progress Notes (Signed)
Patient presents today for treatment. Vital signs within parameters for treatment. Patient has no new complaints related to side effects of treatment.   Treatment given today per MD orders. Tolerated infusion without adverse affects. Vital signs stable. No complaints at this time. Discharged from clinic ambulatory in stable condition. Alert and oriented x 3. F/U with [facility] as scheduled.

## 2020-11-18 NOTE — Patient Instructions (Signed)
[facility]  Discharge Instructions: Thank you for choosing [facility] to provide your oncology and hematology care.  If you have a lab appointment with the [facility], please come in thru the Main Entrance and check in at the main information desk.  Wear comfortable clothing and clothing appropriate for easy access to any Portacath or PICC line.   We strive to give you quality time with your provider. You may need to reschedule your appointment if you arrive late (15 or more minutes).  Arriving late affects you and other patients whose appointments are after yours.  Also, if you miss three or more appointments without notifying the office, you may be dismissed from the clinic at the provider's discretion.      For prescription refill requests, have your pharmacy contact our office and allow 72 hours for refills to be completed.    Today you received the following chemotherapy and/or immunotherapy agents [place]ri.       To help prevent nausea and vomiting after your treatment, we encourage you to take your nausea medication as directed.  BELOW ARE SYMPTOMS THAT SHOULD BE REPORTED IMMEDIATELY: *FEVER GREATER THAN 100.4 F (38 C) OR HIGHER *CHILLS OR SWEATING *NAUSEA AND VOMITING THAT IS NOT CONTROLLED WITH YOUR NAUSEA MEDICATION *UNUSUAL SHORTNESS OF BREATH *UNUSUAL BRUISING OR BLEEDING *URINARY PROBLEMS (pain or burning when urinating, or frequent urination) *BOWEL PROBLEMS (unusual diarrhea, constipation, pain near the anus) TENDERNESS IN MOUTH AND THROAT WITH OR WITHOUT PRESENCE OF ULCERS (sore throat, sores in mouth, or a toothache) UNUSUAL RASH, SWELLING OR PAIN  UNUSUAL VAGINAL DISCHARGE OR ITCHING   Items with * indicate a potential emergency and should be followed up as soon as possible or go to the Emergency Department if any problems should occur.  Please show the CHEMOTHERAPY ALERT CARD or IMMUNOTHERAPY ALERT CARD at check-in to the Emergency  Department and triage nurse.  Should you have questions after your visit or need to cancel or reschedule your appointment, please contact [facility] [phone]  and follow the prompts.  Office hours are 8:00 a.m. to 4:30 p.m. Monday - Friday. Please note that voicemails left after 4:00 p.m. may not be returned until the following business day.  We are closed weekends and major holidays. You have access to a nurse at all times for urgent questions. Please call the main number to the clinic [phone] and follow the prompts.  For any non-urgent questions, you may also contact your provider using MyChart. We now offer e-Visits for anyone 30 and older to request care online for non-urgent symptoms. For details visit [email or web address].   Also download the MyChart app! Go to the app store, search "MyChart", open the app, select [facility], and log in with your MyChart username and password.  Due to Covid, a mask is required upon entering the hospital/clinic. If you do not have a mask, one will be given to you upon arrival. For doctor visits, patients may have 1 support person aged 88 or older with them. For treatment visits, patients cannot have anyone with them due to current Covid guidelines and our immunocompromised population.

## 2020-11-20 ENCOUNTER — Encounter (HOSPITAL_COMMUNITY): Payer: Self-pay | Admitting: Hematology

## 2020-11-23 ENCOUNTER — Ambulatory Visit (HOSPITAL_COMMUNITY)
Admission: RE | Admit: 2020-11-23 | Discharge: 2020-11-23 | Disposition: A | Discharge status: Home / Self Care | Payer: PRIVATE HEALTH INSURANCE | Source: Ambulatory Visit | Attending: Hematology | Admitting: Hematology

## 2020-11-23 DIAGNOSIS — Z09 Encounter for follow-up examination after completed treatment for conditions other than malignant neoplasm: Secondary | ICD-10-CM | POA: Insufficient documentation

## 2020-11-23 DIAGNOSIS — Z0189 Encounter for other specified special examinations: Secondary | ICD-10-CM

## 2020-11-23 DIAGNOSIS — Z170 Estrogen receptor positive status [ER+]: Secondary | ICD-10-CM | POA: Diagnosis not present

## 2020-11-23 DIAGNOSIS — C50411 Malignant neoplasm of upper-outer quadrant of right female breast: Secondary | ICD-10-CM | POA: Insufficient documentation

## 2020-11-23 NOTE — Progress Notes (Signed)
*  PRELIMINARY RESULTS* Echocardiogram 2D Echocardiogram has been performed.  Samuel Germany 11/23/2020, 10:04 AM

## 2020-11-24 LAB — ECHOCARDIOGRAM COMPLETE
Area-P 1/2: 3.77 cm2
Calc EF: 53 %
S' Lateral: 3.2 cm
Single Plane A2C EF: 50.9 %
Single Plane A4C EF: 54.8 %

## 2020-11-30 ENCOUNTER — Encounter (HOSPITAL_COMMUNITY): Payer: Self-pay

## 2020-12-07 ENCOUNTER — Other Ambulatory Visit: Payer: Self-pay

## 2020-12-07 ENCOUNTER — Ambulatory Visit: Payer: PRIVATE HEALTH INSURANCE | Attending: Surgery

## 2020-12-07 VITALS — Wt 137.0 lb

## 2020-12-07 DIAGNOSIS — Z483 Aftercare following surgery for neoplasm: Secondary | ICD-10-CM | POA: Insufficient documentation

## 2020-12-07 NOTE — Therapy (Signed)
[facility] @ [address] [place], [place], [address] Phone: [phone]   Fax:  [phone]  Physical Therapy Treatment  Patient Details  Name: Lauren Ortega MRN: [id number] Date of Birth: 08-08-1980 Referring Provider (PT): Dr. Ninfa Linden   Encounter Date: 12/07/2020   PT End of Session - 12/07/20 1559     Visit Number 7   # unchanged due to screen only   PT Start Time 1525    PT Stop Time 1543    PT Time Calculation (min) 18 min    Activity Tolerance Patient tolerated treatment well    Behavior During Therapy [facility] for tasks assessed/performed             Past Medical History:  Diagnosis Date   Anemia    Anxiety    Breast cancer ([place]) 03/2020   Family history of colon cancer    Family history of lymphoma    Family history of pancreatic cancer    Family history of skin cancer    Hypertension    Port-A-Cath in place 06/16/2020    Past Surgical History:  Procedure Laterality Date   BREAST LUMPECTOMY WITH RADIOACTIVE SEED AND SENTINEL LYMPH NODE BIOPSY Right 05/20/2020   Procedure: RIGHT BREAST LUMPECTOMY WITH RADIOACTIVE SEED AND SENTINEL LYMPH NODE BIOPSY;  Surgeon: Coralie Keens, MD;  Location: [facility];  Service: General;  Laterality: Right;   PORTACATH PLACEMENT Left 05/20/2020   Procedure: INSERTION PORT-A-CATH;  Surgeon: Coralie Keens, MD;  Location: [facility];  Service: General;  Laterality: Left;   TONSILLECTOMY     TUBAL LIGATION      Vitals:   12/07/20 1526  Weight: 137 lb (62.1 kg)     Subjective Assessment - 12/07/20 1528     Subjective Pt returns for her 3 month L-Dex screen. "I went to physical therapy at [facility] and that went okay.    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 Ninfa Linden, HTN, DDD lumbar spine, Developed multiple bloodclots in LUE due to  portacath which are now resolved.  Swelling from bloodclots has not resolved.                    L-DEX FLOWSHEETS - 12/07/20 1500       L-DEX LYMPHEDEMA SCREENING   Measurement Type Unilateral    L-DEX MEASUREMENT EXTREMITY Upper Extremity    POSITION  Standing    DOMINANT SIDE Right    At Risk Side Right    BASELINE SCORE (UNILATERAL) 0.1    L-DEX SCORE (UNILATERAL) -16.9    VALUE CHANGE (UNILAT) -17                                     PT Long Term Goals - 10/16/20 0905       PT LONG TERM GOAL #1   Title Pt will be independent is self MLD to reduce Left UE swelling    Baseline 10/16/20: Reports compliance wiht self MLD daily without questions    Status Achieved      PT LONG TERM GOAL #2   Title Pt will have reduction in swelling of left arm by 1-2 cm at 15 cm prox to olecranon and 15 cm prox to ulna    Baseline 10/16/20:  reduced proximal olecranon 1.8,  increased proximal ulna    Status On-going      PT LONG TERM GOAL #3   Title Pt will be fit for appropriate compression garments for UE swelling      PT LONG TERM GOAL #4   Title Pt will be independent in self management of lymphedema      PT LONG TERM GOAL #5   Title Pt will note decreased cording at right breast by 50% or greater    Baseline 10/16/20:  Reports decreased cording by 50%, no longer painful in ribcage, does still feel in breast    Status Achieved                   Plan - 12/07/20 1600     Clinical Impression Statement Pt returns for her 3 month L-Dex screen. Her change from baseline of -17 is WNLs so no further treatment is required at this time except to cont every 3 month L-Dex screens which pt is agreeable to. She reports she went to [facility] for some physical therapy and was instructed in self MLD and got a compression sleeve (lymphdiva) and has been wearing this for about 5-6 weeks and reports much reduction in her Lt UE swelling from the blood clots. Her  SOZO score is still off which is seeming to be from the swelling still present in her Lt UE is affecting the SOZO reading.    PT Next Visit Plan Cont every 3 month L-Dex screens for up to 2 years from her SLNB (05/21/22)    Consulted and Agree with Plan of Care Patient             Patient will benefit from skilled therapeutic intervention in order to improve the following deficits and impairments:     Visit Diagnosis: Aftercare following surgery for neoplasm     Problem List Patient Active Problem List   Diagnosis Date Noted   Port-A-Cath in place 06/16/2020   Genetic testing 05/12/2020   Family history of pancreatic cancer    Family history of skin cancer    Family history of lymphoma    Family history of colon cancer    Malignant neoplasm of upper-outer quadrant of right female breast ([place]) 04/16/2020    Lauren Ortega, Lauren Ortega 12/07/2020, 4:11 PM  [facility] @ [address] [place], [place], [address] Phone: [phone]   Fax:  [phone]  Name: Lauren Ortega MRN: [id number] Date of Birth: Jun 30, 1980

## 2020-12-08 NOTE — Progress Notes (Signed)
Lauren Ortega, [place] [address]   CLINIC:  Medical Oncology/Hematology  PCP:  [name], [address] [place] [address] [phone]   REASON FOR VISIT:  Follow-up for right breast cancer  PRIOR THERAPY: none  NGS Results: Her2+  CURRENT THERAPY: Docetaxel + Carboplatin + Trastuzumab (TCH) q21d / Trastuzumab q21d  BRIEF ONCOLOGIC HISTORY:  Oncology History  Malignant neoplasm of upper-outer quadrant of right female breast ([place])  04/16/2020 Initial Diagnosis   Malignant neoplasm of upper-outer quadrant of right female breast [facility])    Genetic Testing   Negative genetic testing. No pathogenic variants identified on the [facility] Multi-Cancer Panel+RNA. The report date is 05/10/2020.  The Multi-Cancer Panel + RNA offered by [facility] includes sequencing and/or deletion duplication testing of the following 84 genes: AIP, ALK, APC, ATM, AXIN2,BAP1,  BARD1, BLM, BMPR1A, BRCA1, BRCA2, BRIP1, CASR, CDC73, CDH1, CDK4, CDKN1B, CDKN1C, CDKN2A (p14ARF), CDKN2A (p16INK4a), CEBPA, CHEK2, CTNNA1, DICER1, DIS3L2, EGFR (c.2369C>T, p.Thr790Met variant only), EPCAM (Deletion/duplication testing only), FH, FLCN, GATA2, GPC3, GREM1 (Promoter region deletion/duplication testing only), HOXB13 (c.251G>A, p.Gly84Glu), HRAS, KIT, MAX, MEN1, MET, MITF (c.952G>A, p.Glu318Lys variant only), MLH1, MSH2, MSH3, MSH6, MUTYH, NBN, NF1, NF2, NTHL1, PALB2, PDGFRA, PHOX2B, PMS2, POLD1, POLE, POT1, PRKAR1A, PTCH1, PTEN, RAD50, RAD51C, RAD51D, RB1, RECQL4, RET, RUNX1, SDHAF2, SDHA (sequence changes only), SDHB, SDHC, SDHD, SMAD4, SMARCA4, SMARCB1, SMARCE1, STK11, SUFU, TERC, TERT, TMEM127, TP53, TSC1, TSC2, VHL, WRN and WT1.   06/23/2020 -  Chemotherapy   Patient is on Treatment Plan : BREAST Docetaxel + Carboplatin + Trastuzumab (TCH) q21d / Trastuzumab q21d       CANCER STAGING: Cancer Staging Malignant neoplasm of upper-outer quadrant of right female breast  ([place]) Staging form: Breast, AJCC 8th Edition - Clinical stage from 04/21/2020: Stage IA (cT1c, cN0, cM0, G3, ER+, PR-, HER2+) - Unsigned   INTERVAL HISTORY:  [name], a 40 y.o. female, returns for routine follow-up and consideration for next cycle of chemotherapy. Lauren Ortega was last seen on 10/28/2020.  Due for cycle #9 of Herceptin today.   Overall, she tells me she has been feeling pretty well. She reports worsened constant numbness and tingling in her feet bilaterally which is worst in the mornings. The pain in her legs has improved bilaterally. She is taking tamoxifen and is tolerating it well. She reports stable hot flashes, and she denies spotting and vaginal bleeding. She reports her gums are sensitive and occasionally bleed. She reports occasional leg swellings.   Overall, she feels ready for next cycle of chemo today.   REVIEW OF SYSTEMS:  Review of Systems  Constitutional:  Negative for appetite change and fatigue (80%).  Cardiovascular:  Positive for leg swelling (occasional).  Endocrine: Positive for hot flashes (stable).  Genitourinary:  Negative for vaginal bleeding.   Neurological:  Positive for dizziness and numbness (4/10 feet neuropathy).  Psychiatric/Behavioral:  Positive for sleep disturbance. The patient is nervous/anxious.   All other systems reviewed and are negative.  PAST MEDICAL/SURGICAL HISTORY:  Past Medical History:  Diagnosis Date   Anemia    Anxiety    Breast cancer ([place]) 03/2020   Family history of colon cancer    Family history of lymphoma    Family history of pancreatic cancer    Family history of skin cancer    Hypertension    Port-A-Cath in place 06/16/2020   Past Surgical History:  Procedure Laterality Date   BREAST LUMPECTOMY WITH RADIOACTIVE SEED AND SENTINEL LYMPH  NODE BIOPSY Right 05/20/2020   Procedure: RIGHT BREAST LUMPECTOMY WITH RADIOACTIVE SEED AND SENTINEL LYMPH NODE BIOPSY;  Surgeon: Coralie Keens, MD;  Location: [facility];  Service: General;  Laterality: Right;   PORTACATH PLACEMENT Left 05/20/2020   Procedure: INSERTION PORT-A-CATH;  Surgeon: Coralie Keens, MD;  Location: [facility];  Service: General;  Laterality: Left;   TONSILLECTOMY     TUBAL LIGATION      SOCIAL HISTORY:  Social History   Socioeconomic History   Marital status: Divorced    Spouse name: Not on file   Number of children: Not on file   Years of education: Not on file   Highest education level: Not on file  Occupational History   Not on file  Tobacco Use   Smoking status: Never   Smokeless tobacco: Never  Substance and Sexual Activity   Alcohol use: Yes    Comment: occassionally   Drug use: Never   Sexual activity: Yes    Birth control/protection: Surgical  Other Topics Concern   Not on file  Social History Narrative   Not on file   Social Determinants of Health   Financial Resource Strain: Low Risk    Difficulty of Paying Living Expenses: Not hard at all  Food Insecurity: No Food Insecurity   Worried About Charity fundraiser in the Last Year: Never true   Ran Out of Food in the Last Year: Never true  Transportation Needs: No Transportation Needs   Lack of Transportation (Medical): No   Lack of Transportation (Non-Medical): No  Physical Activity: Insufficiently Active   Days of Exercise per Week: 2 days   Minutes of Exercise per Session: 20 min  Stress: No Stress Concern Present   Feeling of Stress : Not at all  Social Connections: Moderately Integrated   Frequency of Communication with Friends and Family: More than three times a week   Frequency of Social Gatherings with Friends and Family: More than three times a week   Attends Religious Services: 1 to 4 times per year   Active Member of [employer] or Organizations: No   Attends Music therapist: 1 to 4 times per year   Marital Status: Divorced  Human resources officer Violence: Not At Risk   Fear of Current or  Ex-Partner: No   Emotionally Abused: No   Physically Abused: No   Sexually Abused: No    FAMILY HISTORY:  Family History  Problem Relation Age of Onset   Basal cell carcinoma Mother 10   Bladder Cancer Maternal Uncle 72   Lymphoma Maternal Uncle 33       [place]s    Pancreatic cancer Maternal Grandmother    Non-Hodgkin's lymphoma Maternal Grandfather    Lung cancer Paternal Grandfather    Colon cancer Other        x8 or 9   Colon cancer Maternal Great-grandfather     CURRENT MEDICATIONS:  Current Outpatient Medications  Medication Sig Dispense Refill   amLODipine (NORVASC) 10 MG tablet Take 10 mg by mouth daily.     Baclofen 5 MG TABS Take 5 mg by mouth 3 (three) times daily as needed. 30 tablet 0   CARBOPLATIN IV Inject into the vein every 21 ( twenty-one) days.     diphenoxylate-atropine (LOMOTIL) 2.5-0.025 MG tablet Take 2 tablets by mouth 4 (four) times daily as needed for diarrhea or loose stools. 30 tablet 3   DOCETAXEL IV Inject into the vein every 21 (  twenty-one) days.     ELIQUIS 5 MG TABS tablet TAKE 1 TABLET BY MOUTH TWICE A DAY 60 tablet 3   FERREX 150 150 MG capsule Take 150 mg by mouth daily.     folic acid (FOLVITE) 1 MG tablet Take by mouth.     lidocaine-prilocaine (EMLA) cream Apply a small amount to port a cath site and cover with plastic wrap 1 hour prior to infusion appointments 30 g 3   magnesium oxide (MAG-OX) 400 (240 Mg) MG tablet TAKE 1 TABLET BY MOUTH IN THE MORNING AND IN THE EVENING 60 tablet 5   meloxicam (MOBIC) 15 MG tablet Take 15 mg by mouth daily as needed.     oxyCODONE (OXY IR/ROXICODONE) 5 MG immediate release tablet Take 1 tablet (5 mg total) by mouth every 6 (six) hours as needed for moderate pain or severe pain. 25 tablet 0   prochlorperazine (COMPAZINE) 10 MG tablet Take 1 tablet (10 mg total) by mouth every 6 (six) hours as needed (Nausea or vomiting). 30 tablet 1   spironolactone (ALDACTONE) 50 MG tablet Take 50 mg by mouth every  morning.     tamoxifen (NOLVADEX) 20 MG tablet Take 1 tablet (20 mg total) by mouth daily. 30 tablet 5   temazepam (RESTORIL) 15 MG capsule Take 1 capsule (15 mg total) by mouth at bedtime as needed for sleep. 30 capsule 2   trastuzumab in sodium chloride 0.9 % 250 mL Inject into the vein every 21 ( twenty-one) days.     vitamin B-12 (CYANOCOBALAMIN) 1000 MCG tablet Take 1 tablet (1,000 mcg total) by mouth daily. 90 tablet 1   No current facility-administered medications for this visit.    ALLERGIES:  No Known Allergies  PHYSICAL EXAM:  Performance status (ECOG): 0 - Asymptomatic  There were no vitals filed for this visit. Wt Readings from Last 3 Encounters:  12/07/20 137 lb (62.1 kg)  11/18/20 136 lb (61.7 kg)  10/28/20 136 lb 11 oz (62 kg)   Physical Exam Vitals reviewed.  Constitutional:      Appearance: Normal appearance.  Cardiovascular:     Rate and Rhythm: Normal rate and regular rhythm.     Pulses: Normal pulses.     Heart sounds: Normal heart sounds.  Pulmonary:     Effort: Pulmonary effort is normal.     Breath sounds: Normal breath sounds.  Neurological:     General: No focal deficit present.     Mental Status: She is alert and oriented to person, place, and time.  Psychiatric:        Mood and Affect: Mood normal.        Behavior: Behavior normal.    LABORATORY DATA:  I have reviewed the labs as listed.  CBC Latest Ref Rng & Units 11/18/2020 10/28/2020 10/07/2020  WBC 4.0 - 10.5 K/uL 5.6 4.4 5.7  Hemoglobin 12.0 - 15.0 g/dL 11.9(L) 9.4(L) 9.6(L)  Hematocrit 36.0 - 46.0 % 35.2(L) 28.2(L) 29.1(L)  Platelets 150 - 400 K/uL 320 150 155   CMP Latest Ref Rng & Units 11/18/2020 10/28/2020 10/07/2020  Glucose 70 - 99 mg/dL 98 100(H) 98  BUN 6 - 20 mg/dL _0 Creatinine 0.44 - 1.00 mg/dL 0.74 0.70 0.63  Sodium 135 - 145 mmol/L 136 136 137  Potassium 3.5 - 5.1 mmol/L 3.7 3.9 3.8  Chloride 98 - 111 mmol/L 103 104 104  CO2 22 - 32 mmol/L _1 Calcium 8.9 -  10.3 mg/dL  9.3 9.0 9.1  Total Protein 6.5 - 8.1 g/dL 7.3 6.7 6.8  Total Bilirubin 0.3 - 1.2 mg/dL 0.5 0.7 0.5  Alkaline Phos 38 - 126 U/L 52 71 67  AST 15 - 41 U/L _0 ALT 0 - 44 U/L _1 DIAGNOSTIC IMAGING:  I have independently reviewed the scans and discussed with the patient. ECHOCARDIOGRAM COMPLETE  Result Date: 11/24/2020    ECHOCARDIOGRAM REPORT   Patient Name:   [facility] [place] Date of Exam: 11/23/2020 Medical Rec #:  [id number]      Height:       60.0 in Accession #:    [id number]     Weight:       136.0 lb Date of Birth:  1980/12/25      BSA:          1.584 m Patient Age:    58 years       BP:           118/78 mmHg Patient Gender: F              HR:           76 bpm. Exam Location:  [facility] Procedure: 2D Echo, Cardiac Doppler, Color Doppler and Strain Analysis Indications:     Z09 (ICD-10-CM) - Chemotherapy follow-up examination  History:         Patient has prior history of Echocardiogram examinations, most                  recent 08/18/2020. Risk Factors:Hypertension. Malignant neoplasm                  of upper-outer quadrant of right breast in female, estrogen                  receptor positive ([place]).  Sonographer:     Alvino Chapel RCS Referring Phys:  [id number] Derek Jack Diagnosing Phys: Rozann Lesches MD IMPRESSIONS  1. Left ventricular ejection fraction, by estimation, is 50 to 55%. The left ventricle has low normal function. The left ventricle has no regional wall motion abnormalities. Left ventricular diastolic parameters were normal. Normal global longitudinal strain of -16.6%.  2. Right ventricular systolic function is normal. The right ventricular size is normal. There is normal pulmonary artery systolic pressure. The estimated right ventricular systolic pressure is 65.7 mmHg.  3. Left atrial size was mildly dilated.  4. The mitral valve is grossly normal. Trivial mitral valve regurgitation.  5. The aortic valve is tricuspid. Aortic valve regurgitation is not  visualized.  6. The inferior vena cava is normal in size with greater than 50% respiratory variability, suggesting right atrial pressure of 3 mmHg. Comparison(s): No significant change from prior study. Prior images reviewed side by side. FINDINGS  Left Ventricle: Left ventricular ejection fraction, by estimation, is 50 to 55%. The left ventricle has low normal function. The left ventricle has no regional wall motion abnormalities. The left ventricular internal cavity size was normal in size. There is no left ventricular hypertrophy. Left ventricular diastolic parameters were normal. Right Ventricle: The right ventricular size is normal. No increase in right ventricular wall thickness. Right ventricular systolic function is normal. There is normal pulmonary artery systolic pressure. The tricuspid regurgitant velocity is 2.45 m/s, and  with an assumed right atrial pressure of 3 mmHg, the estimated right ventricular systolic pressure is 90.3 mmHg. Left Atrium: Left atrial size was mildly dilated. Right Atrium: Right atrial size  was normal in size. Pericardium: There is no evidence of pericardial effusion. Mitral Valve: The mitral valve is grossly normal. Trivial mitral valve regurgitation. Tricuspid Valve: The tricuspid valve is grossly normal. Tricuspid valve regurgitation is mild. Aortic Valve: The aortic valve is tricuspid. Aortic valve regurgitation is not visualized. Pulmonic Valve: The pulmonic valve was not well visualized. Pulmonic valve regurgitation is trivial. Aorta: The aortic root is normal in size and structure. Venous: The inferior vena cava is normal in size with greater than 50% respiratory variability, suggesting right atrial pressure of 3 mmHg. IAS/Shunts: No atrial level shunt detected by color flow Doppler.  LEFT VENTRICLE PLAX 2D LVIDd:         4.40 cm     Diastology LVIDs:         3.20 cm     LV e' medial:    10.40 cm/s LV PW:         0.90 cm     LV E/e' medial:  9.9 LV IVS:        0.90 cm      LV e' lateral:   15.20 cm/s LVOT diam:     1.90 cm     LV E/e' lateral: 6.8 LV SV:         69 LV SV Index:   44 LVOT Area:     2.84 cm  LV Volumes (MOD) LV vol d, MOD A2C: 93.5 ml LV vol d, MOD A4C: 99.5 ml LV vol s, MOD A2C: 45.9 ml LV vol s, MOD A4C: 45.0 ml LV SV MOD A2C:     47.6 ml LV SV MOD A4C:     99.5 ml LV SV MOD BP:      53.0 ml RIGHT VENTRICLE RV S prime:     13.90 cm/s TAPSE (M-mode): 2.2 cm LEFT ATRIUM           Index        RIGHT ATRIUM           Index LA diam:      4.00 cm 2.52 cm/m   RA Area:     14.20 cm LA Vol (A2C): 59.4 ml 37.49 ml/m  RA Volume:   37.30 ml  23.54 ml/m LA Vol (A4C): 50.5 ml 31.87 ml/m  AORTIC VALVE LVOT Vmax:   121.00 cm/s LVOT Vmean:  80.000 cm/s LVOT VTI:    0.244 m  AORTA Ao Root diam: 2.80 cm MITRAL VALVE                TRICUSPID VALVE MV Area (PHT): 3.77 cm     TR Peak grad:   24.0 mmHg MV Decel Time: 201 msec     TR Vmax:        245.00 cm/s MV E velocity: 103.00 cm/s MV A velocity: 66.40 cm/s   SHUNTS MV E/A ratio:  1.55         Systemic VTI:  0.24 m                             Systemic Diam: 1.90 cm Rozann Lesches MD Electronically signed by Rozann Lesches MD Signature Date/Time: 11/23/2020/1:47:21 PM    Final (Updated)      ASSESSMENT:  1.  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  She had mammogram/ultrasound on 03/18/2020 done in [place] which showed a  1.4 x 0.9 x 1.5 cm mass in the upper outer quadrant of the right breast. -Biopsy on 03/31/2020 consistent with intermediate to high-grade infiltrating ductal carcinoma of the right breast at 10:30 position, HER-2 3+ positive, Ki-67 40%, ER weak staining in less than 10% of tumor cells, PR negative. -MRI of the breast on 04/20/2020 with 1.3 x 1.5 x 1.4 cm irregular enhancing mass within the upper outer right breast.  No abnormal appearing lymph nodes. - Right breast lumpectomy and SLNB on 05/20/2020- grade  3 IDC, 1.8 cm, invasive carcinoma is less than 1 mm from anterior margin focally and less than 1 mm from the posterior margin broadly.  Margins negative for in situ carcinoma.  Lymphovascular invasion present.  0/5 lymph nodes positive.  There is focal ductal carcinoma within the vascular space in the soft tissue adjacent to the lymph node.  No carcinoma is seen within the lymph nodes.  PT1CPN0. - Genetic testing was negative. - 6 cycles of [place] from 06/23/2020 through 10/07/2020.   2.  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  Never smoker. -Maternal grandmother had colon cancer.  Maternal grandfather died of non-Hodgkin's lymphoma.  Paternal grandfather died of lung cancer.  Maternal uncle had multiple myeloma.  Mother had basal cell skin cancer.  3.  Left upper extremity DVT: - Doppler on 07/06/2020 positive for acute DVT in the left axillary, peripheral subclavian vein and superficial thrombosis involving left basilic vein. - This is port induced and she was started on Eliquis.   PLAN:  1.  Stage I (T1 cN0 M0) right breast IDC, HER2 positive: -She is tolerating tamoxifen very well.  She has not noticed any side effects from it.  Hot flashes have been stable.  No vaginal bleeding or spotting.  Reports that her gums are sensitive. - She met with Dr. Barnetta Chapel in [place].  She will start radiation therapy either this week or next week. - Reviewed her labs today which showed normal LFTs and renal function.  CBC was grossly normal. - Recommend continuing Herceptin today and in 3 weeks.  RTC 6 weeks for follow-up.   2.  High risk drug monitoring: -No clinical signs or symptoms of PND or orthopnea. - 2D echocardiogram on 11/23/2020 shows EF 50-55%.  Continue monitoring every 3 months.   3.  Normocytic anemia: -Continue iron tablet daily.  Hemoglobin improved to 12.3.  4.  Left upper extremity DVT: -Continue Eliquis twice daily.  No bleeding issues  reported.  5.  Hypomagnesemia: -Continue magnesium twice daily.  Magnesium is normal today at 1.8.  6.  Peripheral neuropathy: -She reports pins-and-needles sensation in the legs have gotten better.  She feels pins-and-needles sensation in the feet has gotten worse. - I have offered her to start on gabapentin.  She would like to wait and see if it gets better in the next 3 weeks or so.   Orders placed this encounter:  No orders of the defined types were placed in this encounter.    Derek Jack, MD [place] [phone]   I, Thana Ates, am acting as a scribe for Dr. Derek Jack.  I, Derek Jack MD, have reviewed the above documentation for accuracy and completeness, and I agree with the above.

## 2020-12-09 ENCOUNTER — Ambulatory Visit (HOSPITAL_COMMUNITY): Payer: PRIVATE HEALTH INSURANCE

## 2020-12-09 ENCOUNTER — Other Ambulatory Visit (HOSPITAL_COMMUNITY): Payer: PRIVATE HEALTH INSURANCE

## 2020-12-09 ENCOUNTER — Ambulatory Visit (HOSPITAL_COMMUNITY): Payer: PRIVATE HEALTH INSURANCE | Admitting: Hematology

## 2020-12-09 ENCOUNTER — Other Ambulatory Visit: Payer: Self-pay

## 2020-12-09 ENCOUNTER — Inpatient Hospital Stay (HOSPITAL_COMMUNITY): Payer: PRIVATE HEALTH INSURANCE

## 2020-12-09 ENCOUNTER — Inpatient Hospital Stay (HOSPITAL_COMMUNITY): Payer: PRIVATE HEALTH INSURANCE | Attending: Hematology | Admitting: Hematology

## 2020-12-09 VITALS — BP 131/74 | HR 72 | Temp 98.3°F | Resp 18

## 2020-12-09 DIAGNOSIS — Z170 Estrogen receptor positive status [ER+]: Secondary | ICD-10-CM

## 2020-12-09 DIAGNOSIS — G629 Polyneuropathy, unspecified: Secondary | ICD-10-CM | POA: Diagnosis not present

## 2020-12-09 DIAGNOSIS — C50411 Malignant neoplasm of upper-outer quadrant of right female breast: Secondary | ICD-10-CM | POA: Diagnosis present

## 2020-12-09 DIAGNOSIS — Z79899 Other long term (current) drug therapy: Secondary | ICD-10-CM | POA: Diagnosis not present

## 2020-12-09 DIAGNOSIS — Z95828 Presence of other vascular implants and grafts: Secondary | ICD-10-CM

## 2020-12-09 DIAGNOSIS — Z5112 Encounter for antineoplastic immunotherapy: Secondary | ICD-10-CM | POA: Insufficient documentation

## 2020-12-09 DIAGNOSIS — D649 Anemia, unspecified: Secondary | ICD-10-CM | POA: Insufficient documentation

## 2020-12-09 LAB — COMPREHENSIVE METABOLIC PANEL
ALT: 16 U/L (ref 0–44)
AST: 20 U/L (ref 15–41)
Albumin: 3.9 g/dL (ref 3.5–5.0)
Alkaline Phosphatase: 55 U/L (ref 38–126)
Anion gap: 8 (ref 5–15)
BUN: 15 mg/dL (ref 6–20)
CO2: 25 mmol/L (ref 22–32)
Calcium: 9.2 mg/dL (ref 8.9–10.3)
Chloride: 103 mmol/L (ref 98–111)
Creatinine, Ser: 0.92 mg/dL (ref 0.44–1.00)
GFR, Estimated: >60 mL/min (ref >60)
Glucose, Bld: 102 mg/dL — ABNORMAL HIGH (ref 70–99)
Potassium: 3.8 mmol/L (ref 3.5–5.1)
Sodium: 136 mmol/L (ref 135–145)
Total Bilirubin: 0.5 mg/dL (ref 0.3–1.2)
Total Protein: 7 g/dL (ref 6.5–8.1)

## 2020-12-09 LAB — CBC WITH DIFFERENTIAL/PLATELET
Abs Immature Granulocytes: 0.02 10*3/uL (ref 0.00–0.07)
Basophils Absolute: 0.1 10*3/uL (ref 0.0–0.1)
Basophils Relative: 1 %
Eosinophils Absolute: 0.1 10*3/uL (ref 0.0–0.5)
Eosinophils Relative: 2 %
HCT: 36.4 % (ref 36.0–46.0)
Hemoglobin: 12.3 g/dL (ref 12.0–15.0)
Immature Granulocytes: 0 %
Lymphocytes Relative: 24 %
Lymphs Abs: 1.3 10*3/uL (ref 0.7–4.0)
MCH: 34.6 pg — ABNORMAL HIGH (ref 26.0–34.0)
MCHC: 33.8 g/dL (ref 30.0–36.0)
MCV: 102.2 fL — ABNORMAL HIGH (ref 80.0–100.0)
Monocytes Absolute: 0.6 10*3/uL (ref 0.1–1.0)
Monocytes Relative: 10 %
Neutro Abs: 3.5 10*3/uL (ref 1.7–7.7)
Neutrophils Relative %: 63 %
Platelets: 275 10*3/uL (ref 150–400)
RBC: 3.56 MIL/uL — ABNORMAL LOW (ref 3.87–5.11)
RDW: 12.2 % (ref 11.5–15.5)
WBC: 5.6 10*3/uL (ref 4.0–10.5)
nRBC: 0 % (ref 0.0–0.2)

## 2020-12-09 LAB — MAGNESIUM: Magnesium: 1.8 mg/dL (ref 1.7–2.4)

## 2020-12-09 MED ORDER — SODIUM CHLORIDE 0.9 % IV SOLN: Freq: Once | INTRAVENOUS | Status: AC

## 2020-12-09 MED ORDER — ACETAMINOPHEN 325 MG PO TABS: 650.0000 mg | ORAL_TABLET | Freq: Once | ORAL | Status: DC

## 2020-12-09 MED ORDER — SODIUM CHLORIDE 0.9% FLUSH
10.0000 mL | INTRAVENOUS | Status: DC | PRN
  Administered 2020-12-09: 10 mL

## 2020-12-09 MED ORDER — HEPARIN SOD (PORK) LOCK FLUSH 100 UNIT/ML IV SOLN
500.0000 [IU] | Freq: Once | INTRAVENOUS | Status: AC | PRN
  Administered 2020-12-09: 500 [IU]

## 2020-12-09 MED ORDER — TRASTUZUMAB-DKST CHEMO 150 MG IV SOLR
6.0000 mg/kg | Freq: Once | INTRAVENOUS | Status: AC
  Administered 2020-12-09: 378 mg via INTRAVENOUS
  Filled 2020-12-09: qty 18

## 2020-12-09 MED ORDER — DIPHENHYDRAMINE HCL 25 MG PO CAPS: 50.0000 mg | ORAL_CAPSULE | Freq: Once | ORAL | Status: DC

## 2020-12-09 NOTE — Progress Notes (Signed)
Patients port flushed without difficulty.  Good blood return noted with no bruising or swelling noted at site.  Stable during access and blood draw.  Patient to remain accessed for treatment. 

## 2020-12-09 NOTE — Patient Instructions (Signed)
[place]  Discharge Instructions: Thank you for choosing [facility] to provide your oncology and hematology care.  If you have a lab appointment with the [facility], please come in thru the Main Entrance and check in at the main information desk.  Wear comfortable clothing and clothing appropriate for easy access to any Portacath or PICC line.   We strive to give you quality time with your provider. You may need to reschedule your appointment if you arrive late (15 or more minutes).  Arriving late affects you and other patients whose appointments are after yours.  Also, if you miss three or more appointments without notifying the office, you may be dismissed from the clinic at the provider's discretion.      For prescription refill requests, have your pharmacy contact our office and allow 72 hours for refills to be completed.    Today you received the following chemotherapy and/or immunotherapy agents ogivri Return as scheduled       To help prevent nausea and vomiting after your treatment, we encourage you to take your nausea medication as directed.  BELOW ARE SYMPTOMS THAT SHOULD BE REPORTED IMMEDIATELY: *FEVER GREATER THAN 100.4 F (38 C) OR HIGHER *CHILLS OR SWEATING *NAUSEA AND VOMITING THAT IS NOT CONTROLLED WITH YOUR NAUSEA MEDICATION *UNUSUAL SHORTNESS OF BREATH *UNUSUAL BRUISING OR BLEEDING *URINARY PROBLEMS (pain or burning when urinating, or frequent urination) *BOWEL PROBLEMS (unusual diarrhea, constipation, pain near the anus) TENDERNESS IN MOUTH AND THROAT WITH OR WITHOUT PRESENCE OF ULCERS (sore throat, sores in mouth, or a toothache) UNUSUAL RASH, SWELLING OR PAIN  UNUSUAL VAGINAL DISCHARGE OR ITCHING   Items with * indicate a potential emergency and should be followed up as soon as possible or go to the Emergency Department if any problems should occur.  Please show the CHEMOTHERAPY ALERT CARD or IMMUNOTHERAPY ALERT CARD at check-in to  the Emergency Department and triage nurse.  Should you have questions after your visit or need to cancel or reschedule your appointment, please contact [facility] [phone]  and follow the prompts.  Office hours are 8:00 a.m. to 4:30 p.m. Monday - Friday. Please note that voicemails left after 4:00 p.m. may not be returned until the following business day.  We are closed weekends and major holidays. You have access to a nurse at all times for urgent questions. Please call the main number to the clinic [phone] and follow the prompts.  For any non-urgent questions, you may also contact your provider using MyChart. We now offer e-Visits for anyone 20 and older to request care online for non-urgent symptoms. For details visit [email or web address].   Also download the MyChart app! Go to the app store, search "MyChart", open the app, select [place], and log in with your MyChart username and password.  Due to Covid, a mask is required upon entering the hospital/clinic. If you do not have a mask, one will be given to you upon arrival. For doctor visits, patients may have 1 support person aged 16 or older with them. For treatment visits, patients cannot have anyone with them due to current Covid guidelines and our immunocompromised population.   Trastuzumab injection for infusion What is this medication? TRASTUZUMAB (tras TOO zoo mab) is a monoclonal antibody. It is used to treat breast cancer and stomach cancer. This medicine may be used for other purposes; ask your health care provider or pharmacist if you have questions. COMMON BRAND NAME(S): Herceptin, Belenda Cruise, Ogivri, Ontruzant,  Trazimera What should I tell my care team before I take this medication? They need to know if you have any of these conditions: heart disease heart failure lung or breathing disease, like asthma an unusual or allergic reaction to trastuzumab, benzyl alcohol, or other medications, foods,  dyes, or preservatives pregnant or trying to get pregnant breast-feeding How should I use this medication? This drug is given as an infusion into a vein. It is administered in a hospital or clinic by a specially trained health care professional. Talk to your pediatrician regarding the use of this medicine in children. This medicine is not approved for use in children. Overdosage: If you think you have taken too much of this medicine contact a poison control center or emergency room at once. NOTE: This medicine is only for you. Do not share this medicine with others. What if I miss a dose? It is important not to miss a dose. Call your doctor or health care professional if you are unable to keep an appointment. What may interact with this medication? This medicine may interact with the following medications: certain types of chemotherapy, such as daunorubicin, doxorubicin, epirubicin, and idarubicin This list may not describe all possible interactions. Give your health care provider a list of all the medicines, herbs, non-prescription drugs, or dietary supplements you use. Also tell them if you smoke, drink alcohol, or use illegal drugs. Some items may interact with your medicine. What should I watch for while using this medication? Visit your doctor for checks on your progress. Report any side effects. Continue your course of treatment even though you feel ill unless your doctor tells you to stop. Call your doctor or health care professional for advice if you get a fever, chills or sore throat, or other symptoms of a cold or flu. Do not treat yourself. Try to avoid being around people who are sick. You may experience fever, chills and shaking during your first infusion. These effects are usually mild and can be treated with other medicines. Report any side effects during the infusion to your health care professional. Fever and chills usually do not happen with later infusions. Do not become pregnant  while taking this medicine or for 7 months after stopping it. Women should inform their doctor if they wish to become pregnant or think they might be pregnant. Women of child-bearing potential will need to have a negative pregnancy test before starting this medicine. There is a potential for serious side effects to an unborn child. Talk to your health care professional or pharmacist for more information. Do not breast-feed an infant while taking this medicine or for 7 months after stopping it. Women must use effective birth control with this medicine. What side effects may I notice from receiving this medication? Side effects that you should report to your doctor or health care professional as soon as possible: allergic reactions like skin rash, itching or hives, swelling of the face, lips, or tongue chest pain or palpitations cough dizziness feeling faint or lightheaded, falls fever general ill feeling or flu-like symptoms signs of worsening heart failure like breathing problems; swelling in your legs and feet unusually weak or tired Side effects that usually do not require medical attention (report to your doctor or health care professional if they continue or are bothersome): bone pain changes in taste diarrhea joint pain nausea/vomiting weight loss This list may not describe all possible side effects. Call your doctor for medical advice about side effects. You may report side effects  to FDA at 1-800-FDA-1088. Where should I keep my medication? This drug is given in a hospital or clinic and will not be stored at home. NOTE: This sheet is a summary. It may not cover all possible information. If you have questions about this medicine, talk to your doctor, pharmacist, or health care provider.  2022 [employer]vier/Gold Standard (2016-01-26 00:00:00)

## 2020-12-09 NOTE — Progress Notes (Signed)
Pt here for ogivri.  Good for treatment per Dr Raliegh Ip.  Labs can be drawn every other treatment.  Pt took tylenol and benadryl at home at 1130 am.  Toelrated treatment well today without incidence.  Stable during and after discharge.  AVS reviewed.  Discharged in stable condition ambulatory.  Vital signs stable prior to discharge.

## 2020-12-11 ENCOUNTER — Encounter (HOSPITAL_COMMUNITY): Payer: Self-pay | Admitting: Hematology

## 2020-12-30 ENCOUNTER — Other Ambulatory Visit: Payer: Self-pay

## 2020-12-30 ENCOUNTER — Inpatient Hospital Stay (HOSPITAL_COMMUNITY): Payer: PRIVATE HEALTH INSURANCE | Attending: Hematology

## 2020-12-30 ENCOUNTER — Inpatient Hospital Stay (HOSPITAL_COMMUNITY): Payer: PRIVATE HEALTH INSURANCE

## 2020-12-30 VITALS — BP 127/84 | HR 95 | Temp 98.8°F | Resp 18

## 2020-12-30 DIAGNOSIS — Z5112 Encounter for antineoplastic immunotherapy: Secondary | ICD-10-CM | POA: Diagnosis present

## 2020-12-30 DIAGNOSIS — Z7901 Long term (current) use of anticoagulants: Secondary | ICD-10-CM | POA: Insufficient documentation

## 2020-12-30 DIAGNOSIS — Z86718 Personal history of other venous thrombosis and embolism: Secondary | ICD-10-CM | POA: Insufficient documentation

## 2020-12-30 DIAGNOSIS — Z79899 Other long term (current) drug therapy: Secondary | ICD-10-CM | POA: Diagnosis not present

## 2020-12-30 DIAGNOSIS — C50411 Malignant neoplasm of upper-outer quadrant of right female breast: Secondary | ICD-10-CM | POA: Diagnosis present

## 2020-12-30 DIAGNOSIS — Z170 Estrogen receptor positive status [ER+]: Secondary | ICD-10-CM | POA: Insufficient documentation

## 2020-12-30 DIAGNOSIS — Z923 Personal history of irradiation: Secondary | ICD-10-CM | POA: Insufficient documentation

## 2020-12-30 DIAGNOSIS — Z95828 Presence of other vascular implants and grafts: Secondary | ICD-10-CM

## 2020-12-30 LAB — CBC WITH DIFFERENTIAL/PLATELET
Abs Immature Granulocytes: 0.02 10*3/uL (ref 0.00–0.07)
Basophils Absolute: 0 10*3/uL (ref 0.0–0.1)
Basophils Relative: 1 %
Eosinophils Absolute: 0.1 10*3/uL (ref 0.0–0.5)
Eosinophils Relative: 2 %
HCT: 37.5 % (ref 36.0–46.0)
Hemoglobin: 12.6 g/dL (ref 12.0–15.0)
Immature Granulocytes: 1 %
Lymphocytes Relative: 15 %
Lymphs Abs: 0.7 10*3/uL (ref 0.7–4.0)
MCH: 33.5 pg (ref 26.0–34.0)
MCHC: 33.6 g/dL (ref 30.0–36.0)
MCV: 99.7 fL (ref 80.0–100.0)
Monocytes Absolute: 0.6 10*3/uL (ref 0.1–1.0)
Monocytes Relative: 13 %
Neutro Abs: 3.1 10*3/uL (ref 1.7–7.7)
Neutrophils Relative %: 68 %
Platelets: 277 10*3/uL (ref 150–400)
RBC: 3.76 MIL/uL — ABNORMAL LOW (ref 3.87–5.11)
RDW: 12.2 % (ref 11.5–15.5)
WBC: 4.4 10*3/uL (ref 4.0–10.5)
nRBC: 0 % (ref 0.0–0.2)

## 2020-12-30 LAB — COMPREHENSIVE METABOLIC PANEL
ALT: 14 U/L (ref 0–44)
AST: 20 U/L (ref 15–41)
Albumin: 3.7 g/dL (ref 3.5–5.0)
Alkaline Phosphatase: 51 U/L (ref 38–126)
Anion gap: 7 (ref 5–15)
BUN: 15 mg/dL (ref 6–20)
CO2: 27 mmol/L (ref 22–32)
Calcium: 9.4 mg/dL (ref 8.9–10.3)
Chloride: 103 mmol/L (ref 98–111)
Creatinine, Ser: 0.78 mg/dL (ref 0.44–1.00)
GFR, Estimated: >60 mL/min (ref >60)
Glucose, Bld: 91 mg/dL (ref 70–99)
Potassium: 3.8 mmol/L (ref 3.5–5.1)
Sodium: 137 mmol/L (ref 135–145)
Total Bilirubin: 0.5 mg/dL (ref 0.3–1.2)
Total Protein: 7 g/dL (ref 6.5–8.1)

## 2020-12-30 LAB — MAGNESIUM: Magnesium: 1.7 mg/dL (ref 1.7–2.4)

## 2020-12-30 MED ORDER — TRASTUZUMAB-DKST CHEMO 150 MG IV SOLR
6.0000 mg/kg | Freq: Once | INTRAVENOUS | Status: AC
  Administered 2020-12-30: 378 mg via INTRAVENOUS
  Filled 2020-12-30: qty 18

## 2020-12-30 MED ORDER — SODIUM CHLORIDE 0.9 % IV SOLN
Freq: Once | INTRAVENOUS | Status: AC
Start: 2020-12-30 — End: 2020-12-30

## 2020-12-30 MED ORDER — SODIUM CHLORIDE 0.9% FLUSH
10.0000 mL | INTRAVENOUS | Status: DC | PRN
  Administered 2020-12-30 (×2): 10 mL

## 2020-12-30 MED ORDER — HEPARIN SOD (PORK) LOCK FLUSH 100 UNIT/ML IV SOLN
500.0000 [IU] | Freq: Once | INTRAVENOUS | Status: AC | PRN
  Administered 2020-12-30: 500 [IU]

## 2020-12-30 NOTE — Progress Notes (Signed)
Per RN APAP and Benadryl taken at home

## 2020-12-30 NOTE — Progress Notes (Signed)
Patient presents today for Ogivri, no complaints from patient at this time. Patient takes tylenol and benadryl at home.Patient tolerated chemotherapy with no complaints voiced. Side effects with management reviewed understanding verbalized. Port site clean and dry with no bruising or swelling noted at site. Good blood return noted before and after administration of chemotherapy. Band aid applied. Patient declined AVS. Patient left in satisfactory condition with VSS and no s/s of distress noted.

## 2020-12-30 NOTE — Patient Instructions (Signed)
[place]  Discharge Instructions: Thank you for choosing [facility] to provide your oncology and hematology care.  If you have a lab appointment with the [facility], please come in thru the Main Entrance and check in at the main information desk.  Wear comfortable clothing and clothing appropriate for easy access to any Portacath or PICC line.   We strive to give you quality time with your provider. You may need to reschedule your appointment if you arrive late (15 or more minutes).  Arriving late affects you and other patients whose appointments are after yours.  Also, if you miss three or more appointments without notifying the office, you may be dismissed from the clinic at the provider's discretion.      For prescription refill requests, have your pharmacy contact our office and allow 72 hours for refills to be completed.    Today you received Ogiviri, return as scheduled.  To help prevent nausea and vomiting after your treatment, we encourage you to take your nausea medication as directed.  BELOW ARE SYMPTOMS THAT SHOULD BE REPORTED IMMEDIATELY: *FEVER GREATER THAN 100.4 F (38 C) OR HIGHER *CHILLS OR SWEATING *NAUSEA AND VOMITING THAT IS NOT CONTROLLED WITH YOUR NAUSEA MEDICATION *UNUSUAL SHORTNESS OF BREATH *UNUSUAL BRUISING OR BLEEDING *URINARY PROBLEMS (pain or burning when urinating, or frequent urination) *BOWEL PROBLEMS (unusual diarrhea, constipation, pain near the anus) TENDERNESS IN MOUTH AND THROAT WITH OR WITHOUT PRESENCE OF ULCERS (sore throat, sores in mouth, or a toothache) UNUSUAL RASH, SWELLING OR PAIN  UNUSUAL VAGINAL DISCHARGE OR ITCHING   Items with * indicate a potential emergency and should be followed up as soon as possible or go to the Emergency Department if any problems should occur.  Please show the CHEMOTHERAPY ALERT CARD or IMMUNOTHERAPY ALERT CARD at check-in to the Emergency Department and triage nurse.  Should you  have questions after your visit or need to cancel or reschedule your appointment, please contact [facility] [phone]  and follow the prompts.  Office hours are 8:00 a.m. to 4:30 p.m. Monday - Friday. Please note that voicemails left after 4:00 p.m. may not be returned until the following business day.  We are closed weekends and major holidays. You have access to a nurse at all times for urgent questions. Please call the main number to the clinic [phone] and follow the prompts.  For any non-urgent questions, you may also contact your provider using MyChart. We now offer e-Visits for anyone 55 and older to request care online for non-urgent symptoms. For details visit [email or web address].   Also download the MyChart app! Go to the app store, search "MyChart", open the app, select [place], and log in with your MyChart username and password.  Due to Covid, a mask is required upon entering the hospital/clinic. If you do not have a mask, one will be given to you upon arrival. For doctor visits, patients may have 1 support person aged 42 or older with them. For treatment visits, patients cannot have anyone with them due to current Covid guidelines and our immunocompromised population.

## 2020-12-30 NOTE — Progress Notes (Signed)
Patient presents today for OGIVRI infusion per providers order.  Vital signs within parameters for treatment.  Labs pending.  Patient has no new complaints at this time.

## 2021-01-19 NOTE — Progress Notes (Signed)
Lauren Ortega, [place] [address]   CLINIC:  Medical Oncology/Hematology  PCP:  [name], [address] [place] [address] [phone]   REASON FOR VISIT:  Follow-up for right breast cancer  PRIOR THERAPY: none  NGS Results: Her2  CURRENT THERAPY: Docetaxel + Carboplatin + Trastuzumab (TCH) q21d / Trastuzumab q21d  BRIEF ONCOLOGIC HISTORY:  Oncology History  Malignant neoplasm of upper-outer quadrant of right female breast ([place])  04/16/2020 Initial Diagnosis   Malignant neoplasm of upper-outer quadrant of right female breast [facility])    Genetic Testing   Negative genetic testing. No pathogenic variants identified on the [facility] Multi-Cancer Panel+RNA. The report date is 05/10/2020.  The Multi-Cancer Panel + RNA offered by [facility] includes sequencing and/or deletion duplication testing of the following 84 genes: AIP, ALK, APC, ATM, AXIN2,BAP1,  BARD1, BLM, BMPR1A, BRCA1, BRCA2, BRIP1, CASR, CDC73, CDH1, CDK4, CDKN1B, CDKN1C, CDKN2A (p14ARF), CDKN2A (p16INK4a), CEBPA, CHEK2, CTNNA1, DICER1, DIS3L2, EGFR (c.2369C>T, p.Thr790Met variant only), EPCAM (Deletion/duplication testing only), FH, FLCN, GATA2, GPC3, GREM1 (Promoter region deletion/duplication testing only), HOXB13 (c.251G>A, p.Gly84Glu), HRAS, KIT, MAX, MEN1, MET, MITF (c.952G>A, p.Glu318Lys variant only), MLH1, MSH2, MSH3, MSH6, MUTYH, NBN, NF1, NF2, NTHL1, PALB2, PDGFRA, PHOX2B, PMS2, POLD1, POLE, POT1, PRKAR1A, PTCH1, PTEN, RAD50, RAD51C, RAD51D, RB1, RECQL4, RET, RUNX1, SDHAF2, SDHA (sequence changes only), SDHB, SDHC, SDHD, SMAD4, SMARCA4, SMARCB1, SMARCE1, STK11, SUFU, TERC, TERT, TMEM127, TP53, TSC1, TSC2, VHL, WRN and WT1.   06/23/2020 -  Chemotherapy   Patient is on Treatment Plan : BREAST Docetaxel + Carboplatin + Trastuzumab (TCH) q21d / Trastuzumab q21d       CANCER STAGING:  Cancer Staging  Malignant neoplasm of upper-outer quadrant of right female breast  ([place]) Staging form: Breast, AJCC 8th Edition - Clinical stage from 04/21/2020: Stage IA (cT1c, cN0, cM0, G3, ER+, PR-, HER2+) - Unsigned   INTERVAL HISTORY:  [name], a 40 y.o. female, returns for routine follow-up and consideration for next cycle of chemotherapy. Alonda was last seen on 12/09/2020.  Due for cycle #11 of Herceptin today.   Overall, she tells me she has been feeling pretty well. She completed radiation on 12/23. She started tamoxifen 20 days ago. She reports stable hot flashes, and she denies vaginal bleeding and spotting. The tenderness of her gums is stable. She reports pins-and-needles and numbness in her feet accompanied by deep pain improved with walking and occurs intermittently throughout the day. She has not taken iron tablets in 1-2 weeks due to not being able to get the tablets from the pharmacy. She denies orthopnea.   Overall, she feels ready for next cycle of chemo today.   REVIEW OF SYSTEMS:  Review of Systems  Constitutional:  Negative for appetite change and fatigue.  Endocrine: Positive for hot flashes (stable).  Genitourinary:  Negative for vaginal bleeding.   Musculoskeletal:  Positive for arthralgias (4/10 feet).  Psychiatric/Behavioral:  Positive for sleep disturbance.   All other systems reviewed and are negative.  PAST MEDICAL/SURGICAL HISTORY:  Past Medical History:  Diagnosis Date   Anemia    Anxiety    Breast cancer ([place]) 03/2020   Family history of colon cancer    Family history of lymphoma    Family history of pancreatic cancer    Family history of skin cancer    Hypertension    Port-A-Cath in place 06/16/2020   Past Surgical History:  Procedure Laterality Date   BREAST LUMPECTOMY WITH RADIOACTIVE SEED AND SENTINEL  LYMPH NODE BIOPSY Right 05/20/2020   Procedure: RIGHT BREAST LUMPECTOMY WITH RADIOACTIVE SEED AND SENTINEL LYMPH NODE BIOPSY;  Surgeon: Coralie Keens, MD;  Location: [facility];  Service: General;   Laterality: Right;   PORTACATH PLACEMENT Left 05/20/2020   Procedure: INSERTION PORT-A-CATH;  Surgeon: Coralie Keens, MD;  Location: [facility];  Service: General;  Laterality: Left;   TONSILLECTOMY     TUBAL LIGATION      SOCIAL HISTORY:  Social History   Socioeconomic History   Marital status: Divorced    Spouse name: Not on file   Number of children: Not on file   Years of education: Not on file   Highest education level: Not on file  Occupational History   Not on file  Tobacco Use   Smoking status: Never   Smokeless tobacco: Never  Substance and Sexual Activity   Alcohol use: Yes    Comment: occassionally   Drug use: Never   Sexual activity: Yes    Birth control/protection: Surgical  Other Topics Concern   Not on file  Social History Narrative   Not on file   Social Determinants of Health   Financial Resource Strain: Low Risk    Difficulty of Paying Living Expenses: Not hard at all  Food Insecurity: No Food Insecurity   Worried About Charity fundraiser in the Last Year: Never true   Ran Out of Food in the Last Year: Never true  Transportation Needs: No Transportation Needs   Lack of Transportation (Medical): No   Lack of Transportation (Non-Medical): No  Physical Activity: Insufficiently Active   Days of Exercise per Week: 2 days   Minutes of Exercise per Session: 20 min  Stress: No Stress Concern Present   Feeling of Stress : Not at all  Social Connections: Moderately Integrated   Frequency of Communication with Friends and Family: More than three times a week   Frequency of Social Gatherings with Friends and Family: More than three times a week   Attends Religious Services: 1 to 4 times per year   Active Member of [employer] or Organizations: No   Attends Music therapist: 1 to 4 times per year   Marital Status: Divorced  Human resources officer Violence: Not At Risk   Fear of Current or Ex-Partner: No   Emotionally Abused: No    Physically Abused: No   Sexually Abused: No    FAMILY HISTORY:  Family History  Problem Relation Age of Onset   Basal cell carcinoma Mother 18   Bladder Cancer Maternal Uncle 72   Lymphoma Maternal Uncle 74       Wal[place]stroms    Pancreatic cancer Maternal Grandmother    Non-Hodgkin's lymphoma Maternal Grandfather    Lung cancer Paternal Grandfather    Colon cancer Other        x8 or 9   Colon cancer Maternal Great-grandfather     CURRENT MEDICATIONS:  Current Outpatient Medications  Medication Sig Dispense Refill   amLODipine (NORVASC) 10 MG tablet Take 10 mg by mouth daily.     CARBOPLATIN IV Inject into the vein every 21 ( twenty-one) days.     DOCETAXEL IV Inject into the vein every 21 ( twenty-one) days.     ELIQUIS 5 MG TABS tablet TAKE 1 TABLET BY MOUTH TWICE A DAY 60 tablet 3   FERREX 150 150 MG capsule Take 150 mg by mouth daily.     folic acid (FOLVITE) 1 MG tablet  Take by mouth.     lidocaine-prilocaine (EMLA) cream Apply a small amount to port a cath site and cover with plastic wrap 1 hour prior to infusion appointments 30 g 3   magnesium oxide (MAG-OX) 400 (240 Mg) MG tablet TAKE 1 TABLET BY MOUTH IN THE MORNING AND IN THE EVENING 60 tablet 5   spironolactone (ALDACTONE) 50 MG tablet Take 50 mg by mouth every morning.     tamoxifen (NOLVADEX) 20 MG tablet Take 1 tablet (20 mg total) by mouth daily. 30 tablet 5   trastuzumab in sodium chloride 0.9 % 250 mL Inject into the vein every 21 ( twenty-one) days.     vitamin B-12 (CYANOCOBALAMIN) 1000 MCG tablet Take 1 tablet (1,000 mcg total) by mouth daily. 90 tablet 1   baclofen (LIORESAL) 20 MG tablet Take 20 mg by mouth 3 (three) times daily as needed. (Patient not taking: Reported on 01/20/2021)     Baclofen 5 MG TABS Take 5 mg by mouth 3 (three) times daily as needed. (Patient not taking: Reported on 01/20/2021) 30 tablet 0   diphenoxylate-atropine (LOMOTIL) 2.5-0.025 MG tablet Take 2 tablets by mouth 4 (four) times  daily as needed for diarrhea or loose stools. (Patient not taking: Reported on 01/20/2021) 30 tablet 3   gabapentin (NEURONTIN) 100 MG capsule Take 1 capsule (100 mg total) by mouth 3 (three) times daily. 90 capsule 1   meloxicam (MOBIC) 15 MG tablet Take 15 mg by mouth daily as needed. (Patient not taking: Reported on 01/20/2021)     oxyCODONE (OXY IR/ROXICODONE) 5 MG immediate release tablet Take 1 tablet (5 mg total) by mouth every 6 (six) hours as needed for moderate pain or severe pain. (Patient not taking: Reported on 01/20/2021) 25 tablet 0   prochlorperazine (COMPAZINE) 10 MG tablet Take 1 tablet (10 mg total) by mouth every 6 (six) hours as needed (Nausea or vomiting). (Patient not taking: Reported on 01/20/2021) 30 tablet 1   temazepam (RESTORIL) 15 MG capsule Take 1 capsule (15 mg total) by mouth at bedtime as needed for sleep. (Patient not taking: Reported on 01/20/2021) 30 capsule 2   No current facility-administered medications for this visit.    ALLERGIES:  No Known Allergies  PHYSICAL EXAM:  Performance status (ECOG): 0 - Asymptomatic  Vitals:   01/20/21 0931  BP: 123/87  Resp: 18  Temp: (!) 97 F (36.1 C)  SpO2: 98%   Wt Readings from Last 3 Encounters:  01/20/21 136 lb 12.8 oz (62.1 kg)  12/30/20 135 lb 12.8 oz (61.6 kg)  12/09/20 134 lb 8 oz (61 kg)   Physical Exam Vitals reviewed.  Constitutional:      Appearance: Normal appearance.  Cardiovascular:     Rate and Rhythm: Normal rate and regular rhythm.     Pulses: Normal pulses.     Heart sounds: Normal heart sounds.  Pulmonary:     Effort: Pulmonary effort is normal.     Breath sounds: Normal breath sounds.  Neurological:     General: No focal deficit present.     Mental Status: She is alert and oriented to person, place, and time.  Psychiatric:        Mood and Affect: Mood normal.        Behavior: Behavior normal.    LABORATORY DATA:  I have reviewed the labs as listed.  CBC Latest Ref Rng &  Units 01/20/2021 12/30/2020 12/09/2020  WBC 4.0 - 10.5 K/uL 6.1 4.4 5.6  Hemoglobin 12.0 -  15.0 g/dL 12.4 12.6 12.3  Hematocrit 36.0 - 46.0 % 36.3 37.5 36.4  Platelets 150 - 400 K/uL 256 277 275   CMP Latest Ref Rng & Units 01/20/2021 12/30/2020 12/09/2020  Glucose 70 - 99 mg/dL 96 91 102(H)  BUN 6 - 20 mg/dL _0 Creatinine 0.44 - 1.00 mg/dL 0.67 0.78 0.92  Sodium 135 - 145 mmol/L 135 137 136  Potassium 3.5 - 5.1 mmol/L 3.9 3.8 3.8  Chloride 98 - 111 mmol/L 102 103 103  CO2 22 - 32 mmol/L _1 Calcium 8.9 - 10.3 mg/dL 8.9 9.4 9.2  Total Protein 6.5 - 8.1 g/dL 7.0 7.0 7.0  Total Bilirubin 0.3 - 1.2 mg/dL 0.3 0.5 0.5  Alkaline Phos 38 - 126 U/L 59 51 55  AST 15 - 41 U/L _2 ALT 0 - 44 U/L _3 DIAGNOSTIC IMAGING:  I have independently reviewed the scans and discussed with the patient. No results found.   ASSESSMENT:  1.  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  She had mammogram/ultrasound on 03/18/2020 done in [place] which showed a 1.4 x 0.9 x 1.5 cm mass in the upper outer quadrant of the right breast. -Biopsy on 03/31/2020 consistent with intermediate to high-grade infiltrating ductal carcinoma of the right breast at 10:30 position, HER-2 3+ positive, Ki-67 40%, ER weak staining in less than 10% of tumor cells, PR negative. -MRI of the breast on 04/20/2020 with 1.3 x 1.5 x 1.4 cm irregular enhancing mass within the upper outer right breast.  No abnormal appearing lymph nodes. - Right breast lumpectomy and SLNB on 05/20/2020- grade 3 IDC, 1.8 cm, invasive carcinoma is less than 1 mm from anterior margin focally and less than 1 mm from the posterior margin broadly.  Margins negative for in situ carcinoma.  Lymphovascular invasion present.  0/5 lymph nodes positive.  There is focal ductal carcinoma within the vascular space in the soft tissue adjacent to the lymph  node.  No carcinoma is seen within the lymph nodes.  PT1CPN0. - Genetic testing was negative. - 6 cycles of [place] from 06/23/2020 through 10/07/2020. - XRT to the breast, 20 fractions completed on 01/15/2021 with Dr. Barnetta Chapel in [place].   2.  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  Never smoker. -Maternal grandmother had colon cancer.  Maternal grandfather died of non-Hodgkin's lymphoma.  Paternal grandfather died of lung cancer.  Maternal uncle had multiple myeloma.  Mother had basal cell skin cancer.  3.  Left upper extremity DVT: - Doppler on 07/06/2020 positive for acute DVT in the left axillary, peripheral subclavian vein and superficial thrombosis involving left basilic vein. - This is port induced and she was started on Eliquis.     PLAN:  1.  Stage I (T1 cN0 M0) right breast IDC, HER2 positive: - She is tolerating tamoxifen very well except minor hot flashes which are stable. - She denies any signs or symptoms of PND or orthopnea. - Reviewed her labs today which showed normal LFTs and CBC. - Recommend proceeding with treatment today and in 3 weeks.  RTC 6 weeks for follow-up.   2.  High risk drug monitoring: - Echocardiogram on 11/23/2020 with EF 50-55%.  We will repeat another echo end of January.   3.  Normocytic anemia: - Hemoglobin  is 12.4.  She has completed her iron tablet and stopped it a week ago.  4.  Left upper extremity DVT: - Continue Eliquis twice daily.  No bleeding issues.  5.  Hypomagnesemia: - Continue magnesium twice daily.  Magnesium today is 1.8.  6.  Peripheral neuropathy: - She reports some pain in her feet, left more than right, which gets better on walking.  Pain gets worse in the morning or after a period of rest.  She also cannot feel outside of her feet. - Recommended trial of gabapentin 100 mg 3 times daily, titrate up as tolerated.   Orders placed this encounter:  No orders of the defined types  were placed in this encounter.    Derek Jack, MD [place] [phone]   I, Thana Ates, am acting as a scribe for Dr. Derek Jack.  I, Derek Jack MD, have reviewed the above documentation for accuracy and completeness, and I agree with the above.

## 2021-01-20 ENCOUNTER — Inpatient Hospital Stay (HOSPITAL_COMMUNITY): Payer: PRIVATE HEALTH INSURANCE

## 2021-01-20 ENCOUNTER — Other Ambulatory Visit: Payer: Self-pay

## 2021-01-20 ENCOUNTER — Inpatient Hospital Stay (HOSPITAL_BASED_OUTPATIENT_CLINIC_OR_DEPARTMENT_OTHER): Payer: PRIVATE HEALTH INSURANCE | Admitting: Hematology

## 2021-01-20 VITALS — BP 113/80 | HR 75 | Temp 98.5°F | Resp 18

## 2021-01-20 VITALS — BP 123/87 | Temp 97.0°F | Resp 18 | Ht 60.0 in | Wt 136.8 lb

## 2021-01-20 DIAGNOSIS — C50411 Malignant neoplasm of upper-outer quadrant of right female breast: Secondary | ICD-10-CM | POA: Diagnosis not present

## 2021-01-20 DIAGNOSIS — Z170 Estrogen receptor positive status [ER+]: Secondary | ICD-10-CM | POA: Diagnosis not present

## 2021-01-20 DIAGNOSIS — Z5112 Encounter for antineoplastic immunotherapy: Secondary | ICD-10-CM | POA: Diagnosis not present

## 2021-01-20 DIAGNOSIS — Z95828 Presence of other vascular implants and grafts: Secondary | ICD-10-CM

## 2021-01-20 LAB — CBC WITH DIFFERENTIAL/PLATELET
Abs Immature Granulocytes: 0.03 10*3/uL (ref 0.00–0.07)
Basophils Absolute: 0 10*3/uL (ref 0.0–0.1)
Basophils Relative: 1 %
Eosinophils Absolute: 0.2 10*3/uL (ref 0.0–0.5)
Eosinophils Relative: 3 %
HCT: 36.3 % (ref 36.0–46.0)
Hemoglobin: 12.4 g/dL (ref 12.0–15.0)
Immature Granulocytes: 1 %
Lymphocytes Relative: 12 %
Lymphs Abs: 0.7 10*3/uL (ref 0.7–4.0)
MCH: 33.2 pg (ref 26.0–34.0)
MCHC: 34.2 g/dL (ref 30.0–36.0)
MCV: 97.3 fL (ref 80.0–100.0)
Monocytes Absolute: 0.7 10*3/uL (ref 0.1–1.0)
Monocytes Relative: 11 %
Neutro Abs: 4.4 10*3/uL (ref 1.7–7.7)
Neutrophils Relative %: 72 %
Platelets: 256 10*3/uL (ref 150–400)
RBC: 3.73 MIL/uL — ABNORMAL LOW (ref 3.87–5.11)
RDW: 12.5 % (ref 11.5–15.5)
WBC: 6.1 10*3/uL (ref 4.0–10.5)
nRBC: 0 % (ref 0.0–0.2)

## 2021-01-20 LAB — COMPREHENSIVE METABOLIC PANEL
ALT: 14 U/L (ref 0–44)
AST: 22 U/L (ref 15–41)
Albumin: 3.6 g/dL (ref 3.5–5.0)
Alkaline Phosphatase: 59 U/L (ref 38–126)
Anion gap: 8 (ref 5–15)
BUN: 14 mg/dL (ref 6–20)
CO2: 25 mmol/L (ref 22–32)
Calcium: 8.9 mg/dL (ref 8.9–10.3)
Chloride: 102 mmol/L (ref 98–111)
Creatinine, Ser: 0.67 mg/dL (ref 0.44–1.00)
GFR, Estimated: >60 mL/min (ref >60)
Glucose, Bld: 96 mg/dL (ref 70–99)
Potassium: 3.9 mmol/L (ref 3.5–5.1)
Sodium: 135 mmol/L (ref 135–145)
Total Bilirubin: 0.3 mg/dL (ref 0.3–1.2)
Total Protein: 7 g/dL (ref 6.5–8.1)

## 2021-01-20 LAB — MAGNESIUM: Magnesium: 1.8 mg/dL (ref 1.7–2.4)

## 2021-01-20 MED ORDER — ACETAMINOPHEN 325 MG PO TABS: 650.0000 mg | ORAL_TABLET | Freq: Once | ORAL | Status: DC

## 2021-01-20 MED ORDER — SODIUM CHLORIDE 0.9% FLUSH
10.0000 mL | INTRAVENOUS | Status: DC | PRN
  Administered 2021-01-20: 14:00:00 10 mL

## 2021-01-20 MED ORDER — SODIUM CHLORIDE 0.9 % IV SOLN: Freq: Once | INTRAVENOUS | Status: AC

## 2021-01-20 MED ORDER — DIPHENHYDRAMINE HCL 25 MG PO CAPS: 50.0000 mg | ORAL_CAPSULE | Freq: Once | ORAL | Status: DC

## 2021-01-20 MED ORDER — HEPARIN SOD (PORK) LOCK FLUSH 100 UNIT/ML IV SOLN
500.0000 [IU] | Freq: Once | INTRAVENOUS | Status: AC | PRN
  Administered 2021-01-20: 14:00:00 500 [IU]

## 2021-01-20 MED ORDER — GABAPENTIN 100 MG PO CAPS: 100.0000 mg | ORAL_CAPSULE | Freq: Three times a day (TID) | ORAL | 1 refills | Status: DC

## 2021-01-20 MED ORDER — TRASTUZUMAB-DKST CHEMO 150 MG IV SOLR
6.0000 mg/kg | Freq: Once | INTRAVENOUS | Status: AC
  Administered 2021-01-20: 13:00:00 378 mg via INTRAVENOUS
  Filled 2021-01-20: qty 18

## 2021-01-20 NOTE — Progress Notes (Signed)
Patient has been examined by Dr. Katragadda, and vital signs and labs have been reviewed. ANC, Creatinine, LFTs, hemoglobin, and platelets are within treatment parameters per M.D. - pt may proceed with treatment.    °

## 2021-01-20 NOTE — Patient Instructions (Signed)
[place] at [facility] Discharge Instructions   You were seen and examined today by Dr. Delton Coombes. He reviewed your lab work, which was normal. Continue magnesium as prescribed. Start taking gabapentin up to three times a day to help with pain in your feet.  We will proceed with treatment today and in 3 weeks.     Thank you for choosing [place] at [facility] to provide your oncology and hematology care.  To afford each patient quality time with our provider, please arrive at least 15 minutes before your scheduled appointment time.   If you have a lab appointment with the [facility] please come in thru the Main Entrance and check in at the main information desk.  You need to re-schedule your appointment should you arrive 10 or more minutes late.  We strive to give you quality time with our providers, and arriving late affects you and other patients whose appointments are after yours.  Also, if you no show three or more times for appointments you may be dismissed from the clinic at the providers discretion.     Again, thank you for choosing [facility].  Our hope is that these requests will decrease the amount of time that you wait before being seen by our physicians.       _____________________________________________________________  Should you have questions after your visit to [facility], please contact our office at [phone] and follow the prompts.  Our office hours are 8:00 a.m. and 4:30 p.m. Monday - Friday.  Please note that voicemails left after 4:00 p.m. may not be returned until the following business day.  We are closed weekends and major holidays.  You do have access to a nurse 24-7, just call the main number to the clinic [phone] and do not press any options, hold on the line and a nurse will answer the phone.    For prescription refill requests, have your pharmacy contact our office and  allow 72 hours.    Due to Covid, you will need to wear a mask upon entering the hospital. If you do not have a mask, a mask will be given to you at the Main Entrance upon arrival. For doctor visits, patients may have 1 support person age 9 or older with them. For treatment visits, patients can not have anyone with them due to social distancing guidelines and our immunocompromised population.

## 2021-01-20 NOTE — Progress Notes (Signed)
Pt presents today for Ogivri per provider's order. Vital signs and labs WNL for treatment today.Pt took pre-meds at 0830 prior to arrival. Okay to proceed with treatment per Dr.K.  Madalyn Rob given today per MD orders. Tolerated infusion without adverse affects. Vital signs stable. No complaints at this time. Discharged from clinic ambulatory in stable condition. Alert and oriented x 3. F/U with [facility] as scheduled.

## 2021-01-20 NOTE — Patient Instructions (Signed)
Lauren Ortega  Discharge Instructions: Thank you for choosing [facility] to provide your oncology and hematology care.  If you have a lab appointment with the [facility], please come in thru the Main Entrance and check in at the main information desk.  Wear comfortable clothing and clothing appropriate for easy access to any Portacath or PICC line.   We strive to give you quality time with your provider. You may need to reschedule your appointment if you arrive late (15 or more minutes).  Arriving late affects you and other patients whose appointments are after yours.  Also, if you miss three or more appointments without notifying the office, you may be dismissed from the clinic at the providers discretion.      For prescription refill requests, have your pharmacy contact our office and allow 72 hours for refills to be completed.    Today you received the following chemotherapy and/or immunotherapy agents Ogivri.   To help prevent nausea and vomiting after your treatment, we encourage you to take your nausea medication as directed.  BELOW ARE SYMPTOMS THAT SHOULD BE REPORTED IMMEDIATELY: *FEVER GREATER THAN 100.4 F (38 C) OR HIGHER *CHILLS OR SWEATING *NAUSEA AND VOMITING THAT IS NOT CONTROLLED WITH YOUR NAUSEA MEDICATION *UNUSUAL SHORTNESS OF BREATH *UNUSUAL BRUISING OR BLEEDING *URINARY PROBLEMS (pain or burning when urinating, or frequent urination) *BOWEL PROBLEMS (unusual diarrhea, constipation, pain near the anus) TENDERNESS IN MOUTH AND THROAT WITH OR WITHOUT PRESENCE OF ULCERS (sore throat, sores in mouth, or a toothache) UNUSUAL RASH, SWELLING OR PAIN  UNUSUAL VAGINAL DISCHARGE OR ITCHING   Items with * indicate a potential emergency and should be followed up as soon as possible or go to the Emergency Department if any problems should occur.  Please show the CHEMOTHERAPY ALERT CARD or IMMUNOTHERAPY ALERT CARD at check-in to the Emergency Department  and triage nurse.  Should you have questions after your visit or need to cancel or reschedule your appointment, please contact [facility] [phone]  and follow the prompts.  Office hours are 8:00 a.m. to 4:30 p.m. Monday - Friday. Please note that voicemails left after 4:00 p.m. may not be returned until the following business day.  We are closed weekends and major holidays. You have access to a nurse at all times for urgent questions. Please call the main number to the clinic [phone] and follow the prompts.  For any non-urgent questions, you may also contact your provider using MyChart. We now offer e-Visits for anyone 82 and older to request care online for non-urgent symptoms. For details visit [email or web address].   Also download the MyChart app! Go to the app store, search "MyChart", open the app, select [facility], and log in with your MyChart username and password.  Due to Covid, a mask is required upon entering the hospital/clinic. If you do not have a mask, one will be given to you upon arrival. For doctor visits, patients may have 1 support person aged 55 or older with them. For treatment visits, patients cannot have anyone with them due to current Covid guidelines and our immunocompromised population.

## 2021-01-28 ENCOUNTER — Other Ambulatory Visit (HOSPITAL_COMMUNITY): Payer: Self-pay | Admitting: *Deleted

## 2021-01-28 ENCOUNTER — Encounter (HOSPITAL_COMMUNITY): Payer: Self-pay

## 2021-01-28 MED ORDER — VITAMIN B-12 1000 MCG PO TABS: 1000.0000 ug | ORAL_TABLET | Freq: Every day | ORAL | 3 refills | Status: DC

## 2021-01-28 MED ORDER — FERREX 150 150 MG PO CAPS: 150.0000 mg | ORAL_CAPSULE | Freq: Every day | ORAL | 3 refills | Status: DC

## 2021-02-03 ENCOUNTER — Encounter (HOSPITAL_COMMUNITY): Payer: Self-pay

## 2021-02-03 ENCOUNTER — Other Ambulatory Visit (HOSPITAL_COMMUNITY): Payer: Self-pay | Admitting: Hematology

## 2021-02-03 MED ORDER — GABAPENTIN 300 MG PO CAPS: 300.0000 mg | ORAL_CAPSULE | Freq: Three times a day (TID) | ORAL | 3 refills | Status: DC

## 2021-02-03 NOTE — Telephone Encounter (Signed)
Please advise 

## 2021-02-10 ENCOUNTER — Inpatient Hospital Stay (HOSPITAL_COMMUNITY): Payer: PRIVATE HEALTH INSURANCE

## 2021-02-10 ENCOUNTER — Inpatient Hospital Stay (HOSPITAL_COMMUNITY): Payer: PRIVATE HEALTH INSURANCE | Attending: Hematology

## 2021-02-10 ENCOUNTER — Other Ambulatory Visit: Payer: Self-pay

## 2021-02-10 VITALS — BP 126/82 | HR 69 | Temp 99.0°F | Resp 18 | Ht 60.0 in | Wt 137.0 lb

## 2021-02-10 DIAGNOSIS — Z86718 Personal history of other venous thrombosis and embolism: Secondary | ICD-10-CM | POA: Diagnosis not present

## 2021-02-10 DIAGNOSIS — Z5112 Encounter for antineoplastic immunotherapy: Secondary | ICD-10-CM | POA: Insufficient documentation

## 2021-02-10 DIAGNOSIS — Z7901 Long term (current) use of anticoagulants: Secondary | ICD-10-CM | POA: Insufficient documentation

## 2021-02-10 DIAGNOSIS — C50411 Malignant neoplasm of upper-outer quadrant of right female breast: Secondary | ICD-10-CM | POA: Insufficient documentation

## 2021-02-10 DIAGNOSIS — Z79899 Other long term (current) drug therapy: Secondary | ICD-10-CM | POA: Insufficient documentation

## 2021-02-10 DIAGNOSIS — Z170 Estrogen receptor positive status [ER+]: Secondary | ICD-10-CM | POA: Diagnosis not present

## 2021-02-10 DIAGNOSIS — Z923 Personal history of irradiation: Secondary | ICD-10-CM | POA: Insufficient documentation

## 2021-02-10 DIAGNOSIS — Z23 Encounter for immunization: Secondary | ICD-10-CM

## 2021-02-10 DIAGNOSIS — Z95828 Presence of other vascular implants and grafts: Secondary | ICD-10-CM

## 2021-02-10 LAB — COMPREHENSIVE METABOLIC PANEL
ALT: 17 U/L (ref 0–44)
AST: 22 U/L (ref 15–41)
Albumin: 3.9 g/dL (ref 3.5–5.0)
Alkaline Phosphatase: 56 U/L (ref 38–126)
Anion gap: 11 (ref 5–15)
BUN: 19 mg/dL (ref 6–20)
CO2: 25 mmol/L (ref 22–32)
Calcium: 9.4 mg/dL (ref 8.9–10.3)
Chloride: 100 mmol/L (ref 98–111)
Creatinine, Ser: 0.75 mg/dL (ref 0.44–1.00)
GFR, Estimated: >60 mL/min (ref >60)
Glucose, Bld: 100 mg/dL — ABNORMAL HIGH (ref 70–99)
Potassium: 3.8 mmol/L (ref 3.5–5.1)
Sodium: 136 mmol/L (ref 135–145)
Total Bilirubin: 0.7 mg/dL (ref 0.3–1.2)
Total Protein: 7.1 g/dL (ref 6.5–8.1)

## 2021-02-10 LAB — CBC WITH DIFFERENTIAL/PLATELET
Abs Immature Granulocytes: 0.03 10*3/uL (ref 0.00–0.07)
Basophils Absolute: 0 10*3/uL (ref 0.0–0.1)
Basophils Relative: 1 %
Eosinophils Absolute: 0.1 10*3/uL (ref 0.0–0.5)
Eosinophils Relative: 1 %
HCT: 35.9 % — ABNORMAL LOW (ref 36.0–46.0)
Hemoglobin: 12 g/dL (ref 12.0–15.0)
Immature Granulocytes: 1 %
Lymphocytes Relative: 16 %
Lymphs Abs: 0.9 10*3/uL (ref 0.7–4.0)
MCH: 31.7 pg (ref 26.0–34.0)
MCHC: 33.4 g/dL (ref 30.0–36.0)
MCV: 94.7 fL (ref 80.0–100.0)
Monocytes Absolute: 0.6 10*3/uL (ref 0.1–1.0)
Monocytes Relative: 10 %
Neutro Abs: 4 10*3/uL (ref 1.7–7.7)
Neutrophils Relative %: 71 %
Platelets: 279 10*3/uL (ref 150–400)
RBC: 3.79 MIL/uL — ABNORMAL LOW (ref 3.87–5.11)
RDW: 13.1 % (ref 11.5–15.5)
WBC: 5.6 10*3/uL (ref 4.0–10.5)
nRBC: 0 % (ref 0.0–0.2)

## 2021-02-10 LAB — MAGNESIUM: Magnesium: 1.8 mg/dL (ref 1.7–2.4)

## 2021-02-10 MED ORDER — SODIUM CHLORIDE 0.9% FLUSH
10.0000 mL | INTRAVENOUS | Status: DC | PRN
  Administered 2021-02-10: 10 mL

## 2021-02-10 MED ORDER — DIPHENHYDRAMINE HCL 25 MG PO CAPS: 50.0000 mg | ORAL_CAPSULE | Freq: Once | ORAL | Status: DC

## 2021-02-10 MED ORDER — HEPARIN SOD (PORK) LOCK FLUSH 100 UNIT/ML IV SOLN
500.0000 [IU] | Freq: Once | INTRAVENOUS | Status: AC | PRN
  Administered 2021-02-10: 500 [IU]

## 2021-02-10 MED ORDER — INFLUENZA VAC SPLIT QUAD 0.5 ML IM SUSY
0.5000 mL | PREFILLED_SYRINGE | Freq: Once | INTRAMUSCULAR | Status: AC
  Administered 2021-02-10: 0.5 mL via INTRAMUSCULAR
  Filled 2021-02-10: qty 0.5

## 2021-02-10 MED ORDER — ACETAMINOPHEN 325 MG PO TABS: 650.0000 mg | ORAL_TABLET | Freq: Once | ORAL | Status: DC

## 2021-02-10 MED ORDER — SODIUM CHLORIDE 0.9 % IV SOLN: Freq: Once | INTRAVENOUS | Status: AC

## 2021-02-10 MED ORDER — TRASTUZUMAB-DKST CHEMO 150 MG IV SOLR
6.0000 mg/kg | Freq: Once | INTRAVENOUS | Status: AC
  Administered 2021-02-10: 378 mg via INTRAVENOUS
  Filled 2021-02-10: qty 18

## 2021-02-10 NOTE — Progress Notes (Signed)
Patients port flushed without difficulty.  Good blood return noted with no bruising or swelling noted at site.  Patient remains accessed for chemotherapy treatment.  

## 2021-02-10 NOTE — Progress Notes (Signed)
Patient presents today for chemotherapy infusion.   Patient is in satisfactory condition with no complaints voiced.  Vital signs are stable.  Labs reviewed and all labs are within treatment parameters.  Pre-medications were taken at home at 1130.  We will proceed with treatment per MD orders.   Patient tolerated treatment well with no complaints voiced.  Patient left ambulatory in stable condition.  Vital signs stable at discharge.  Follow up as scheduled.

## 2021-02-10 NOTE — Patient Instructions (Signed)
[facility]  Discharge Instructions: Thank you for choosing [facility] to provide your oncology and hematology care.  If you have a lab appointment with the Cancer Center, please come in thru the Main Entrance and check in at the main information desk.  Wear comfortable clothing and clothing appropriate for easy access to any Portacath or PICC line.   We strive to give you quality time with your provider. You may need to reschedule your appointment if you arrive late (15 or more minutes).  Arriving late affects you and other patients whose appointments are after yours.  Also, if you miss three or more appointments without notifying the office, you may be dismissed from the clinic at the provider's discretion.      For prescription refill requests, have your pharmacy contact our office and allow 72 hours for refills to be completed.        To help prevent nausea and vomiting after your treatment, we encourage you to take your nausea medication as directed.  BELOW ARE SYMPTOMS THAT SHOULD BE REPORTED IMMEDIATELY: *FEVER GREATER THAN 100.4 F (38 C) OR HIGHER *CHILLS OR SWEATING *NAUSEA AND VOMITING THAT IS NOT CONTROLLED WITH YOUR NAUSEA MEDICATION *UNUSUAL SHORTNESS OF BREATH *UNUSUAL BRUISING OR BLEEDING *URINARY PROBLEMS (pain or burning when urinating, or frequent urination) *BOWEL PROBLEMS (unusual diarrhea, constipation, pain near the anus) TENDERNESS IN MOUTH AND THROAT WITH OR WITHOUT PRESENCE OF ULCERS (sore throat, sores in mouth, or a toothache) UNUSUAL RASH, SWELLING OR PAIN  UNUSUAL VAGINAL DISCHARGE OR ITCHING   Items with * indicate a potential emergency and should be followed up as soon as possible or go to the Emergency Department if any problems should occur.  Please show the CHEMOTHERAPY ALERT CARD or IMMUNOTHERAPY ALERT CARD at check-in to the Emergency Department and triage nurse.  Should you have questions after your visit or need to cancel  or reschedule your appointment, please contact [facility] [phone]  and follow the prompts.  Office hours are 8:00 a.m. to 4:30 p.m. Monday - Friday. Please note that voicemails left after 4:00 p.m. may not be returned until the following business day.  We are closed weekends and major holidays. You have access to a nurse at all times for urgent questions. Please call the main number to the clinic [phone] and follow the prompts.  For any non-urgent questions, you may also contact your provider using MyChart. We now offer e-Visits for anyone 18 and older to request care online for non-urgent symptoms. For details visit [email or web address].   Also download the MyChart app! Go to the app store, search "MyChart", open the app, select [facility], and log in with your MyChart username and password.  Due to Covid, a mask is required upon entering the hospital/clinic. If you do not have a mask, one will be given to you upon arrival. For doctor visits, patients may have 1 support person aged 18 or older with them. For treatment visits, patients cannot have anyone with them due to current Covid guidelines and our immunocompromised population.  

## 2021-02-18 ENCOUNTER — Encounter (HOSPITAL_COMMUNITY): Payer: Self-pay | Admitting: Hematology

## 2021-02-23 ENCOUNTER — Ambulatory Visit (HOSPITAL_COMMUNITY): Admission: RE | Admit: 2021-02-23 | Payer: PRIVATE HEALTH INSURANCE | Source: Ambulatory Visit

## 2021-02-24 ENCOUNTER — Ambulatory Visit (HOSPITAL_COMMUNITY)
Admission: RE | Admit: 2021-02-24 | Discharge: 2021-02-24 | Disposition: A | Discharge status: Home / Self Care | Payer: PRIVATE HEALTH INSURANCE | Source: Ambulatory Visit | Attending: Hematology | Admitting: Hematology

## 2021-02-24 ENCOUNTER — Other Ambulatory Visit: Payer: Self-pay

## 2021-02-24 DIAGNOSIS — Z170 Estrogen receptor positive status [ER+]: Secondary | ICD-10-CM | POA: Insufficient documentation

## 2021-02-24 DIAGNOSIS — Z0189 Encounter for other specified special examinations: Secondary | ICD-10-CM | POA: Diagnosis not present

## 2021-02-24 DIAGNOSIS — C50411 Malignant neoplasm of upper-outer quadrant of right female breast: Secondary | ICD-10-CM | POA: Insufficient documentation

## 2021-02-24 LAB — ECHOCARDIOGRAM COMPLETE
AR max vel: 1.34 cm2
AV Area VTI: 1.66 cm2
AV Area mean vel: 1.37 cm2
AV Mean grad: 8.5 mmHg
AV Peak grad: 18.1 mmHg
Ao pk vel: 2.12 m/s
Area-P 1/2: 3.17 cm2
Calc EF: 51.6 %
S' Lateral: 3.3 cm
Single Plane A2C EF: 52 %
Single Plane A4C EF: 50.7 %

## 2021-02-24 NOTE — Progress Notes (Signed)
*  PRELIMINARY RESULTS* Echocardiogram 2D Echocardiogram has been performed.  Lauren Ortega 02/24/2021, 1:42 PM

## 2021-03-03 ENCOUNTER — Inpatient Hospital Stay (HOSPITAL_COMMUNITY): Payer: PRIVATE HEALTH INSURANCE

## 2021-03-03 ENCOUNTER — Inpatient Hospital Stay (HOSPITAL_BASED_OUTPATIENT_CLINIC_OR_DEPARTMENT_OTHER): Payer: PRIVATE HEALTH INSURANCE | Admitting: Hematology

## 2021-03-03 ENCOUNTER — Other Ambulatory Visit: Payer: Self-pay

## 2021-03-03 ENCOUNTER — Inpatient Hospital Stay (HOSPITAL_COMMUNITY): Payer: PRIVATE HEALTH INSURANCE | Attending: Hematology

## 2021-03-03 ENCOUNTER — Other Ambulatory Visit (HOSPITAL_COMMUNITY): Payer: Self-pay | Admitting: Hematology

## 2021-03-03 VITALS — BP 116/78 | HR 69 | Temp 98.0°F | Resp 18

## 2021-03-03 DIAGNOSIS — Z5112 Encounter for antineoplastic immunotherapy: Secondary | ICD-10-CM | POA: Diagnosis not present

## 2021-03-03 DIAGNOSIS — C50411 Malignant neoplasm of upper-outer quadrant of right female breast: Secondary | ICD-10-CM

## 2021-03-03 DIAGNOSIS — Z9889 Other specified postprocedural states: Secondary | ICD-10-CM

## 2021-03-03 DIAGNOSIS — Z853 Personal history of malignant neoplasm of breast: Secondary | ICD-10-CM

## 2021-03-03 DIAGNOSIS — Z79899 Other long term (current) drug therapy: Secondary | ICD-10-CM | POA: Insufficient documentation

## 2021-03-03 DIAGNOSIS — Z86718 Personal history of other venous thrombosis and embolism: Secondary | ICD-10-CM | POA: Diagnosis not present

## 2021-03-03 DIAGNOSIS — Z95828 Presence of other vascular implants and grafts: Secondary | ICD-10-CM

## 2021-03-03 DIAGNOSIS — Z170 Estrogen receptor positive status [ER+]: Secondary | ICD-10-CM | POA: Diagnosis not present

## 2021-03-03 LAB — COMPREHENSIVE METABOLIC PANEL
ALT: 15 U/L (ref 0–44)
AST: 23 U/L (ref 15–41)
Albumin: 3.7 g/dL (ref 3.5–5.0)
Alkaline Phosphatase: 58 U/L (ref 38–126)
Anion gap: 7 (ref 5–15)
BUN: 17 mg/dL (ref 6–20)
CO2: 26 mmol/L (ref 22–32)
Calcium: 9.2 mg/dL (ref 8.9–10.3)
Chloride: 103 mmol/L (ref 98–111)
Creatinine, Ser: 0.76 mg/dL (ref 0.44–1.00)
GFR, Estimated: >60 mL/min (ref >60)
Glucose, Bld: 91 mg/dL (ref 70–99)
Potassium: 3.7 mmol/L (ref 3.5–5.1)
Sodium: 136 mmol/L (ref 135–145)
Total Bilirubin: 0.4 mg/dL (ref 0.3–1.2)
Total Protein: 6.9 g/dL (ref 6.5–8.1)

## 2021-03-03 LAB — CBC WITH DIFFERENTIAL/PLATELET
Abs Immature Granulocytes: 0.03 10*3/uL (ref 0.00–0.07)
Basophils Absolute: 0.1 10*3/uL (ref 0.0–0.1)
Basophils Relative: 1 %
Eosinophils Absolute: 0.1 10*3/uL (ref 0.0–0.5)
Eosinophils Relative: 1 %
HCT: 35.2 % — ABNORMAL LOW (ref 36.0–46.0)
Hemoglobin: 11.9 g/dL — ABNORMAL LOW (ref 12.0–15.0)
Immature Granulocytes: 1 %
Lymphocytes Relative: 16 %
Lymphs Abs: 1 10*3/uL (ref 0.7–4.0)
MCH: 32.3 pg (ref 26.0–34.0)
MCHC: 33.8 g/dL (ref 30.0–36.0)
MCV: 95.7 fL (ref 80.0–100.0)
Monocytes Absolute: 0.6 10*3/uL (ref 0.1–1.0)
Monocytes Relative: 10 %
Neutro Abs: 4.6 10*3/uL (ref 1.7–7.7)
Neutrophils Relative %: 71 %
Platelets: 301 10*3/uL (ref 150–400)
RBC: 3.68 MIL/uL — ABNORMAL LOW (ref 3.87–5.11)
RDW: 13.2 % (ref 11.5–15.5)
WBC: 6.3 10*3/uL (ref 4.0–10.5)
nRBC: 0 % (ref 0.0–0.2)

## 2021-03-03 LAB — MAGNESIUM: Magnesium: 1.8 mg/dL (ref 1.7–2.4)

## 2021-03-03 MED ORDER — HEPARIN SOD (PORK) LOCK FLUSH 100 UNIT/ML IV SOLN
500.0000 [IU] | Freq: Once | INTRAVENOUS | Status: AC | PRN
  Administered 2021-03-03: 500 [IU]

## 2021-03-03 MED ORDER — TRASTUZUMAB-DKST CHEMO 150 MG IV SOLR
6.0000 mg/kg | Freq: Once | INTRAVENOUS | Status: AC
  Administered 2021-03-03: 378 mg via INTRAVENOUS
  Filled 2021-03-03: qty 18

## 2021-03-03 MED ORDER — SODIUM CHLORIDE 0.9% FLUSH
10.0000 mL | INTRAVENOUS | Status: DC | PRN
  Administered 2021-03-03: 10 mL

## 2021-03-03 MED ORDER — SODIUM CHLORIDE 0.9 % IV SOLN: Freq: Once | INTRAVENOUS | Status: AC

## 2021-03-03 NOTE — Progress Notes (Signed)
° °[facility] °[address] °[place], [place] [address] ° ° °CLINIC:  °Medical Oncology/Hematology ° °PCP:  °Taylor, Laurie R, FNP °[address] / [place] [address] °[phone] ° ° °REASON FOR VISIT:  °Follow-up for right breast cancer ° °PRIOR THERAPY: none ° °NGS Results: Her2 ° °CURRENT THERAPY: Docetaxel + Carboplatin + Trastuzumab (TCH) q21d / Trastuzumab q21d ° °BRIEF ONCOLOGIC HISTORY:  °Oncology History  °Malignant neoplasm of upper-outer quadrant of right female breast (HCC)  °04/16/2020 Initial Diagnosis  ° Malignant neoplasm of upper-outer quadrant of right female breast (HCC) °  ° Genetic Testing  ° Negative genetic testing. No pathogenic variants identified on the [facility] Multi-Cancer Panel+RNA. The report date is 05/10/2020. ° °The Multi-Cancer Panel + RNA offered by [facility] includes sequencing and/or deletion duplication testing of the following 84 genes: AIP, ALK, APC, ATM, AXIN2,BAP1,  BARD1, BLM, BMPR1A, BRCA1, BRCA2, BRIP1, CASR, CDC73, CDH1, CDK4, CDKN1B, CDKN1C, CDKN2A (p14ARF), CDKN2A (p16INK4a), CEBPA, CHEK2, CTNNA1, DICER1, DIS3L2, EGFR (c.2369C>T, p.Thr790Met variant only), EPCAM (Deletion/duplication testing only), FH, FLCN, GATA2, GPC3, GREM1 (Promoter region deletion/duplication testing only), HOXB13 (c.251G>A, p.Gly84Glu), HRAS, KIT, MAX, MEN1, MET, MITF (c.952G>A, p.Glu318Lys variant only), MLH1, MSH2, MSH3, MSH6, MUTYH, NBN, NF1, NF2, NTHL1, PALB2, PDGFRA, PHOX2B, PMS2, POLD1, POLE, POT1, PRKAR1A, PTCH1, PTEN, RAD50, RAD51C, RAD51D, RB1, RECQL4, RET, RUNX1, SDHAF2, SDHA (sequence changes only), SDHB, SDHC, SDHD, SMAD4, SMARCA4, SMARCB1, SMARCE1, STK11, SUFU, TERC, TERT, TMEM127, TP53, TSC1, TSC2, VHL, WRN and WT1. °  °06/23/2020 -  Chemotherapy  ° Patient is on Treatment Plan : BREAST Docetaxel + Carboplatin + Trastuzumab (TCH) q21d / Trastuzumab q21d  °   ° ° °CANCER STAGING: ° Cancer Staging  °Malignant neoplasm of upper-outer quadrant of right female breast  (HCC) °Staging form: Breast, AJCC 8th Edition °- Clinical stage from 04/21/2020: Stage IA (cT1c, cN0, cM0, G3, ER+, PR-, HER2+) - Unsigned ° ° °INTERVAL HISTORY:  °[name], a 40 y.o. female, returns for routine follow-up and consideration for next cycle of chemotherapy. [name] was last seen on 01/20/2021. ° °Due for cycle #13 of Herceptin today.  ° °Overall, she tells me she has been feeling pretty well. She reports constant pain in her legs bilaterally and intermittent cramping on the top of her feet  which radiates up in to her shin. This was not helped by 300 mg Gabapentin TID, and these pains are worse in the left leg and foot. She rates the pain currently at 4/10 but reports is can reach 10/10 intermittently.   ° °Overall, she feels ready for next cycle of chemo today.  ° ° °REVIEW OF SYSTEMS:  °Review of Systems  °Constitutional:  Negative for appetite change and fatigue.  °Musculoskeletal:  Positive for arthralgias (legs).  °All other systems reviewed and are negative. ° °PAST MEDICAL/SURGICAL HISTORY:  °Past Medical History:  °Diagnosis Date  ° Anemia   ° Anxiety   ° Breast cancer (HCC) 03/2020  ° Family history of colon cancer   ° Family history of lymphoma   ° Family history of pancreatic cancer   ° Family history of skin cancer   ° Hypertension   ° Port-A-Cath in place 06/16/2020  ° °Past Surgical History:  °Procedure Laterality Date  ° BREAST LUMPECTOMY WITH RADIOACTIVE SEED AND SENTINEL LYMPH NODE BIOPSY Right 05/20/2020  ° Procedure: RIGHT BREAST LUMPECTOMY WITH RADIOACTIVE SEED AND SENTINEL LYMPH NODE BIOPSY;  Surgeon: Blackman, Douglas, MD;  Location: [facility];  Service: General;  Laterality: Right;  °   PORTACATH PLACEMENT Left 05/20/2020   Procedure: INSERTION PORT-A-CATH;  Surgeon: Coralie Keens, MD;  Location: [facility];  Service: General;  Laterality: Left;   TONSILLECTOMY     TUBAL LIGATION      SOCIAL HISTORY:  Social History   Socioeconomic  History   Marital status: Divorced    Spouse name: Not on file   Number of children: Not on file   Years of education: Not on file   Highest education level: Not on file  Occupational History   Not on file  Tobacco Use   Smoking status: Never   Smokeless tobacco: Never  Substance and Sexual Activity   Alcohol use: Yes    Comment: occassionally   Drug use: Never   Sexual activity: Yes    Birth control/protection: Surgical  Other Topics Concern   Not on file  Social History Narrative   Not on file   Social Determinants of Health   Financial Resource Strain: Low Risk    Difficulty of Paying Living Expenses: Not hard at all  Food Insecurity: No Food Insecurity   Worried About Charity fundraiser in the Last Year: Never true   [place] in the Last Year: Never true  Transportation Needs: No Transportation Needs   Lack of Transportation (Medical): No   Lack of Transportation (Non-Medical): No  Physical Activity: Insufficiently Active   Days of Exercise per Week: 2 days   Minutes of Exercise per Session: 20 min  Stress: No Stress Concern Present   Feeling of Stress : Not at all  Social Connections: Moderately Integrated   Frequency of Communication with Friends and Family: More than three times a week   Frequency of Social Gatherings with Friends and Family: More than three times a week   Attends Religious Services: 1 to 4 times per year   Active Member of [employer] or Organizations: No   Attends Music therapist: 1 to 4 times per year   Marital Status: Divorced  Human resources officer Violence: Not At Risk   Fear of Current or Ex-Partner: No   Emotionally Abused: No   Physically Abused: No   Sexually Abused: No    FAMILY HISTORY:  Family History  Problem Relation Age of Onset   Basal cell carcinoma Mother 9   Bladder Cancer Maternal Uncle 72   Lymphoma Maternal Uncle 45       Waldenstroms    Pancreatic cancer Maternal Grandmother    Non-Hodgkin's  lymphoma Maternal Grandfather    Lung cancer Paternal Grandfather    Colon cancer Other        x8 or 9   Colon cancer Maternal Great-grandfather     CURRENT MEDICATIONS:  Current Outpatient Medications  Medication Sig Dispense Refill   amLODipine (NORVASC) 10 MG tablet Take 10 mg by mouth daily.     baclofen (LIORESAL) 20 MG tablet Take 20 mg by mouth 3 (three) times daily as needed.     Baclofen 5 MG TABS Take 5 mg by mouth 3 (three) times daily as needed. 30 tablet 0   CARBOPLATIN IV Inject into the vein every 21 ( twenty-one) days.     diphenoxylate-atropine (LOMOTIL) 2.5-0.025 MG tablet Take 2 tablets by mouth 4 (four) times daily as needed for diarrhea or loose stools. 30 tablet 3   DOCETAXEL IV Inject into the vein every 21 ( twenty-one) days.     ELIQUIS 5 MG TABS tablet TAKE 1 TABLET BY MOUTH TWICE  A DAY 60 tablet 3  ° FERREX 150 150 MG capsule Take 1 capsule (150 mg total) by mouth daily. 90 capsule 3  ° folic acid (FOLVITE) 1 MG tablet Take by mouth.    ° gabapentin (NEURONTIN) 300 MG capsule Take 1 capsule (300 mg total) by mouth 3 (three) times daily. 90 capsule 3  ° lidocaine-prilocaine (EMLA) cream Apply a small amount to port a cath site and cover with plastic wrap 1 hour prior to infusion appointments 30 g 3  ° magnesium oxide (MAG-OX) 400 (240 Mg) MG tablet TAKE 1 TABLET BY MOUTH IN THE MORNING AND IN THE EVENING 60 tablet 5  ° meloxicam (MOBIC) 15 MG tablet Take 15 mg by mouth daily as needed.    ° oxyCODONE (OXY IR/ROXICODONE) 5 MG immediate release tablet Take 1 tablet (5 mg total) by mouth every 6 (six) hours as needed for moderate pain or severe pain. 25 tablet 0  ° prochlorperazine (COMPAZINE) 10 MG tablet Take 1 tablet (10 mg total) by mouth every 6 (six) hours as needed (Nausea or vomiting). 30 tablet 1  ° spironolactone (ALDACTONE) 50 MG tablet Take 50 mg by mouth every morning.    ° tamoxifen (NOLVADEX) 20 MG tablet Take 1 tablet (20 mg total) by mouth daily. 30 tablet 5  °  temazepam (RESTORIL) 15 MG capsule Take 1 capsule (15 mg total) by mouth at bedtime as needed for sleep. 30 capsule 2  ° trastuzumab in sodium chloride 0.9 % 250 mL Inject into the vein every 21 ( twenty-one) days.    ° vitamin B-12 (CYANOCOBALAMIN) 1000 MCG tablet Take 1 tablet (1,000 mcg total) by mouth daily. 90 tablet 3  ° °No current facility-administered medications for this visit.  ° ° °ALLERGIES:  °No Known Allergies ° °PHYSICAL EXAM:  °Performance status (ECOG): 0 - Asymptomatic ° °There were no vitals filed for this visit. °Wt Readings from Last 3 Encounters:  °03/03/21 138 lb (62.6 kg)  °02/10/21 137 lb (62.1 kg)  °01/20/21 136 lb 12.8 oz (62.1 kg)  ° °Physical Exam °Vitals reviewed.  °Constitutional:   °   Appearance: Normal appearance.  °Cardiovascular:  °   Rate and Rhythm: Normal rate and regular rhythm.  °   Pulses: Normal pulses.  °   Heart sounds: Normal heart sounds.  °Pulmonary:  °   Effort: Pulmonary effort is normal.  °   Breath sounds: Normal breath sounds.  °Chest:  °Breasts: °   Right: Normal. No swelling, bleeding, inverted nipple, mass, nipple discharge, skin change (lumpectomy scar around areola extending from 7:00 to 1:00; thickness around 11:00) or tenderness.  °   Left: Normal. No swelling, bleeding, inverted nipple, mass, nipple discharge, skin change or tenderness.  °Abdominal:  °   Palpations: Abdomen is soft. There is no hepatomegaly, splenomegaly or mass.  °   Tenderness: There is no abdominal tenderness.  °Lymphadenopathy:  °   Lower Body: No right inguinal adenopathy. No left inguinal adenopathy.  °Neurological:  °   General: No focal deficit present.  °   Mental Status: She is alert and oriented to person, place, and time.  °Psychiatric:     °   Mood and Affect: Mood normal.     °   Behavior: Behavior normal.  ° ° °LABORATORY DATA:  °I have reviewed the labs as listed.  °CBC Latest Ref Rng & Units 03/03/2021 02/10/2021 01/20/2021  °WBC 4.0 - 10.5 K/uL 6.3 5.6 6.1  °Hemoglobin 12.0  -   15.0 g/dL 11.9(L) 12.0 12.4  °Hematocrit 36.0 - 46.0 % 35.2(L) 35.9(L) 36.3  °Platelets 150 - 400 K/uL 301 279 256  ° °CMP Latest Ref Rng & Units 03/03/2021 02/10/2021 01/20/2021  °Glucose 70 - 99 mg/dL 91 100(H) 96  °BUN 6 - 20 mg/dL 17 19 14  °Creatinine 0.44 - 1.00 mg/dL 0.76 0.75 0.67  °Sodium 135 - 145 mmol/L 136 136 135  °Potassium 3.5 - 5.1 mmol/L 3.7 3.8 3.9  °Chloride 98 - 111 mmol/L 103 100 102  °CO2 22 - 32 mmol/L 26 25 25  °Calcium 8.9 - 10.3 mg/dL 9.2 9.4 8.9  °Total Protein 6.5 - 8.1 g/dL 6.9 7.1 7.0  °Total Bilirubin 0.3 - 1.2 mg/dL 0.4 0.7 0.3  °Alkaline Phos 38 - 126 U/L 58 56 59  °AST 15 - 41 U/L 23 22 22  °ALT 0 - 44 U/L 15 17 14  ° ° °DIAGNOSTIC IMAGING:  °I have independently reviewed the scans and discussed with the patient. °ECHOCARDIOGRAM COMPLETE ° °Result Date: 02/24/2021 °   ECHOCARDIOGRAM REPORT   Patient Name:   Adelayde Ascencio Date of Exam: 02/24/2021 Medical Rec #:  [id number]      Height:       60.0 in Accession #:    [id number]     Weight:       137.0 lb Date of Birth:  10/28/1980      BSA:          1.589 m² Patient Age:    40 years       BP:           131/75 mmHg Patient Gender: F              HR:           81 bpm. Exam Location:  [facility] Procedure: 2D Echo, Cardiac Doppler and Color Doppler Indications:    C50.411,Z17.0 (ICD-10-CM) - Malignant neoplasm of upper-outer                 quadrant of right breast in female, estrogen receptor positive                 (HCC)  History:        Patient has prior history of Echocardiogram examinations, most                 recent 11/23/2020. Risk Factors:Hypertension. Malignant neoplasm                 of upper-outer quadrant of right breast in female, estrogen                 receptor positive (HCC).  Sonographer:    Bernard White RCS Referring Phys: [id number] SREEDHAR KATRAGADDA IMPRESSIONS  1. Left ventricular ejection fraction, by estimation, is 50 to 55%. The left ventricle has low normal function. The left ventricle has no regional wall motion  abnormalities. Left ventricular diastolic parameters were normal.  2. Right ventricular systolic function is normal. The right ventricular size is normal. There is normal pulmonary artery systolic pressure.  3. Left atrial size was mildly dilated.  4. The mitral valve is normal in structure. No evidence of mitral valve regurgitation. No evidence of mitral stenosis.  5. The aortic valve has an indeterminant number of cusps. Aortic valve regurgitation is not visualized. No aortic stenosis is present.  6. The inferior vena cava is normal in size with greater than 50% respiratory variability, suggesting right atrial pressure of 3   mmHg. FINDINGS  Left Ventricle: Left ventricular ejection fraction, by estimation, is 50 to 55%. The left ventricle has low normal function. The left ventricle has no regional wall motion abnormalities. The left ventricular internal cavity size was normal in size. There is no left ventricular hypertrophy. Left ventricular diastolic parameters were normal. Right Ventricle: The right ventricular size is normal. No increase in right ventricular wall thickness. Right ventricular systolic function is normal. There is normal pulmonary artery systolic pressure. The tricuspid regurgitant velocity is 2.45 m/s, and  with an assumed right atrial pressure of 3 mmHg, the estimated right ventricular systolic pressure is 91.6 mmHg. Left Atrium: Left atrial size was mildly dilated. Right Atrium: Right atrial size was normal in size. Pericardium: There is no evidence of pericardial effusion. Mitral Valve: The mitral valve is normal in structure. No evidence of mitral valve regurgitation. No evidence of mitral valve stenosis. Tricuspid Valve: The tricuspid valve is normal in structure. Tricuspid valve regurgitation is mild . No evidence of tricuspid stenosis. Aortic Valve: The aortic valve has an indeterminant number of cusps. Aortic valve regurgitation is not visualized. No aortic stenosis is present. Aortic  valve mean gradient measures 8.5 mmHg. Aortic valve peak gradient measures 18.1 mmHg. Aortic valve area, by VTI measures 1.66 cm. Pulmonic Valve: The pulmonic valve was not well visualized. Pulmonic valve regurgitation is not visualized. No evidence of pulmonic stenosis. Aorta: The aortic root is normal in size and structure. Venous: The inferior vena cava is normal in size with greater than 50% respiratory variability, suggesting right atrial pressure of 3 mmHg. IAS/Shunts: No atrial level shunt detected by color flow Doppler.  LEFT VENTRICLE PLAX 2D LVIDd:         4.40 cm     Diastology LVIDs:         3.30 cm     LV e' medial:    11.70 cm/s LV PW:         0.90 cm     LV E/e' medial:  8.4 LV IVS:        0.90 cm     LV e' lateral:   14.50 cm/s LVOT diam:     1.80 cm     LV E/e' lateral: 6.8 LV SV:         61 LV SV Index:   38 LVOT Area:     2.54 cm  LV Volumes (MOD) LV vol d, MOD A2C: 78.6 ml LV vol d, MOD A4C: 86.7 ml LV vol s, MOD A2C: 37.7 ml LV vol s, MOD A4C: 42.7 ml LV SV MOD A2C:     40.9 ml LV SV MOD A4C:     86.7 ml LV SV MOD BP:      43.9 ml RIGHT VENTRICLE RV S prime:     14.10 cm/s TAPSE (M-mode): 2.3 cm LEFT ATRIUM             Index        RIGHT ATRIUM           Index LA diam:        4.00 cm 2.52 cm/m   RA Area:     15.70 cm LA Vol (A2C):   57.1 ml 35.93 ml/m  RA Volume:   41.10 ml  25.86 ml/m LA Vol (A4C):   58.9 ml 37.06 ml/m LA Biplane Vol: 59.9 ml 37.69 ml/m  AORTIC VALVE AV Area (Vmax):    1.34 cm AV Area (Vmean):   1.37 cm AV Area (  VTI):     1.66 cm² AV Vmax:           212.43 cm/s AV Vmean:          135.944 cm/s AV VTI:            0.369 m AV Peak Grad:      18.1 mmHg AV Mean Grad:      8.5 mmHg LVOT Vmax:         112.00 cm/s LVOT Vmean:        73.400 cm/s LVOT VTI:          0.240 m LVOT/AV VTI ratio: 0.65  AORTA Ao Root diam: 2.80 cm MITRAL VALVE               TRICUSPID VALVE MV Area (PHT): 3.17 cm²    TR Peak grad:   24.0 mmHg MV Decel Time: 239 msec    TR Vmax:        245.00 cm/s MV E  velocity: 98.20 cm/s MV A velocity: 84.80 cm/s  SHUNTS MV E/A ratio:  1.16        Systemic VTI:  0.24 m                            Systemic Diam: 1.80 cm Jonathan Branch MD Electronically signed by Jonathan Branch MD Signature Date/Time: 02/24/2021/5:04:55 PM    Final     ° °ASSESSMENT:  °1.  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  She had mammogram/ultrasound on 03/18/2020 done in [place] which showed a 1.4 x 0.9 x 1.5 cm mass in the upper outer quadrant of the right breast. °-Biopsy on 03/31/2020 consistent with intermediate to high-grade infiltrating ductal carcinoma of the right breast at 10:30 position, HER-2 3+ positive, Ki-67 40%, ER weak staining in less than 10% of tumor cells, PR negative. °-MRI of the breast on 04/20/2020 with 1.3 x 1.5 x 1.4 cm irregular enhancing mass within the upper outer right breast.  No abnormal appearing lymph nodes. °- Right breast lumpectomy and SLNB on 05/20/2020- grade 3 IDC, 1.8 cm, invasive carcinoma is less than 1 mm from anterior margin focally and less than 1 mm from the posterior margin broadly.  Margins negative for in situ carcinoma.  Lymphovascular invasion present.  0/5 lymph nodes positive.  There is focal ductal carcinoma within the vascular space in the soft tissue adjacent to the lymph node.  No carcinoma is seen within the lymph nodes.  PT1CPN0. °- Genetic testing was negative. °- 6 cycles of TCH from 06/23/2020 through 10/07/2020. °- XRT to the breast, 20 fractions completed on 01/15/2021 with Dr. Q in [place]. °  °2.  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  Never smoker. °-Maternal grandmother had colon cancer.  Maternal grandfather died of non-Hodgkin's lymphoma.  Paternal grandfather died of lung cancer.  Maternal uncle had multiple myeloma.  Mother had basal cell skin cancer. ° °3.  Left upper  extremity DVT: °- Doppler on 07/06/2020 positive for acute DVT in the left axillary, peripheral subclavian vein and superficial thrombosis involving left basilic vein. °- This is port induced and she was started on Eliquis. ° ° °PLAN:  °1.  Stage I (T1 cN0 M0) right breast IDC, HER2 positive: °- She is tolerating tamoxifen except minor hot flashes. °- She   is also tolerating Herceptin very well. °- She has pains in her legs which she thinks coming from tamoxifen.  Will hold tamoxifen for 3 weeks. °- We will check estradiol and FSH levels today.  If the pains improve while off of tamoxifen, will consider starting her on AI if her labs indicate postmenopausal status.  She has not had a period since the start of chemotherapy. °- Breast exam today shows lumpectomy scar around right areola within normal limits.  There is thickening at the right breast 11 o'clock position.  No palpable masses in both breast.  No palpable adenopathy.  We will schedule her for mammogram. °- RTC 3 weeks for follow-up. °  °2.  High risk drug monitoring: °- We have reviewed echocardiogram from 02/24/2021 with EF 50-55%.  No clinical signs or symptoms of PND or orthopnea. °  °3.  Normocytic anemia: °- Hemoglobin is 11.9.  She has stopped oral iron therapy. ° °4.  Left upper extremity DVT: °- Continue Eliquis twice daily.  No bleeding issues. ° °5.  Hypomagnesemia: °- Continue magnesium twice daily. ° °6.  Peripheral neuropathy: °- She has constant aching in the left leg, mostly rated as 4 out of 10, occasionally 10 out of 10.  She also has occasional cramping pain in the left foot/leg, not related to activity.  She reports bilateral feet pain on walking, left more than right. °- She has tried gabapentin 300 mg 3 times daily which did not help. °- Recommend discontinuing gabapentin.  She thinks pain is related to tamoxifen.  Will hold tamoxifen until next visit in 3 weeks. °  °Orders placed this encounter:  °Orders Placed This Encounter  °Procedures   ° Follicle stimulating hormone  ° Luteinizing hormone  ° Estradiol  ° ° ° °Sreedhar Katragadda, MD °[facility] °[phone] ° ° °I, Kirstyn Evans, am acting as a scribe for Dr. Sreedhar Katragadda. ° °I, Sreedhar Katragadda MD, have reviewed the above documentation for accuracy and completeness, and I agree with the above. °  °  °

## 2021-03-03 NOTE — Patient Instructions (Signed)
[facility]  Discharge Instructions: Thank you for choosing [facility] to provide your oncology and hematology care.  If you have a lab appointment with the Cancer Center, please come in thru the Main Entrance and check in at the main information desk.  Wear comfortable clothing and clothing appropriate for easy access to any Portacath or PICC line.   We strive to give you quality time with your provider. You may need to reschedule your appointment if you arrive late (15 or more minutes).  Arriving late affects you and other patients whose appointments are after yours.  Also, if you miss three or more appointments without notifying the office, you may be dismissed from the clinic at the provider's discretion.      For prescription refill requests, have your pharmacy contact our office and allow 72 hours for refills to be completed.        To help prevent nausea and vomiting after your treatment, we encourage you to take your nausea medication as directed.  BELOW ARE SYMPTOMS THAT SHOULD BE REPORTED IMMEDIATELY: *FEVER GREATER THAN 100.4 F (38 C) OR HIGHER *CHILLS OR SWEATING *NAUSEA AND VOMITING THAT IS NOT CONTROLLED WITH YOUR NAUSEA MEDICATION *UNUSUAL SHORTNESS OF BREATH *UNUSUAL BRUISING OR BLEEDING *URINARY PROBLEMS (pain or burning when urinating, or frequent urination) *BOWEL PROBLEMS (unusual diarrhea, constipation, pain near the anus) TENDERNESS IN MOUTH AND THROAT WITH OR WITHOUT PRESENCE OF ULCERS (sore throat, sores in mouth, or a toothache) UNUSUAL RASH, SWELLING OR PAIN  UNUSUAL VAGINAL DISCHARGE OR ITCHING   Items with * indicate a potential emergency and should be followed up as soon as possible or go to the Emergency Department if any problems should occur.  Please show the CHEMOTHERAPY ALERT CARD or IMMUNOTHERAPY ALERT CARD at check-in to the Emergency Department and triage nurse.  Should you have questions after your visit or need to cancel  or reschedule your appointment, please contact [facility] [phone]  and follow the prompts.  Office hours are 8:00 a.m. to 4:30 p.m. Monday - Friday. Please note that voicemails left after 4:00 p.m. may not be returned until the following business day.  We are closed weekends and major holidays. You have access to a nurse at all times for urgent questions. Please call the main number to the clinic [phone] and follow the prompts.  For any non-urgent questions, you may also contact your provider using MyChart. We now offer e-Visits for anyone 18 and older to request care online for non-urgent symptoms. For details visit [email or web address].   Also download the MyChart app! Go to the app store, search "MyChart", open the app, select [facility], and log in with your MyChart username and password.  Due to Covid, a mask is required upon entering the hospital/clinic. If you do not have a mask, one will be given to you upon arrival. For doctor visits, patients may have 1 support person aged 18 or older with them. For treatment visits, patients cannot have anyone with them due to current Covid guidelines and our immunocompromised population.  

## 2021-03-03 NOTE — Progress Notes (Signed)
Patient presents today for chemotherapy infusion.  Patient is in satisfactory condition with no new complaints voiced.  Vital signs are stable.  Labs reviewed by Dr. Katragadda during her office visit.  All labs are within treatment parameters.  We will proceed with treatment per MD orders.   Patient tolerated treatment well with no complaints voiced.  Patient left ambulatory in stable condition.  Vital signs stable at discharge.  Follow up as scheduled.    

## 2021-03-03 NOTE — Patient Instructions (Addendum)
[place] at [facility] Discharge Instructions   You were seen and examined today by Dr. Delton Coombes.  He reviewed the results of your lab work today which is normal/stable.  You may stop gabapentin as it is not helping your neuropathy.  You may stop Tamoxifen for a couple of weeks to see if this is causing the pain and cramping in your legs and feet.   We will proceed with your treatment today.  Return as scheduled for lab work, office visit, and treatment.    Thank you for choosing [place] at [facility] to provide your oncology and hematology care.  To afford each patient quality time with our provider, please arrive at least 15 minutes before your scheduled appointment time.   If you have a lab appointment with the [facility] please come in thru the Main Entrance and check in at the main information desk.  You need to re-schedule your appointment should you arrive 10 or more minutes late.  We strive to give you quality time with our providers, and arriving late affects you and other patients whose appointments are after yours.  Also, if you no show three or more times for appointments you may be dismissed from the clinic at the providers discretion.     Again, thank you for choosing [facility].  Our hope is that these requests will decrease the amount of time that you wait before being seen by our physicians.       _____________________________________________________________  Should you have questions after your visit to [facility], please contact our office at [phone] and follow the prompts.  Our office hours are 8:00 a.m. and 4:30 p.m. Monday - Friday.  Please note that voicemails left after 4:00 p.m. may not be returned until the following business day.  We are closed weekends and major holidays.  You do have access to a nurse 24-7, just call the main number to the clinic [phone] and do not  press any options, hold on the line and a nurse will answer the phone.    For prescription refill requests, have your pharmacy contact our office and allow 72 hours.    Due to Covid, you will need to wear a mask upon entering the hospital. If you do not have a mask, a mask will be given to you at the Main Entrance upon arrival. For doctor visits, patients may have 1 support person age 1 or older with them. For treatment visits, patients can not have anyone with them due to social distancing guidelines and our immunocompromised population.

## 2021-03-03 NOTE — Progress Notes (Signed)
Reviewed at ov on 03/03/21 by Dr. Delton Coombes

## 2021-03-05 LAB — FOLLICLE STIMULATING HORMONE: FSH: 61.9 m[IU]/mL

## 2021-03-05 LAB — ESTRADIOL: Estradiol: <5 pg/mL

## 2021-03-05 LAB — LUTEINIZING HORMONE: LH: 38.9 m[IU]/mL

## 2021-03-08 ENCOUNTER — Ambulatory Visit: Payer: PRIVATE HEALTH INSURANCE | Attending: Surgery

## 2021-03-08 ENCOUNTER — Other Ambulatory Visit: Payer: Self-pay

## 2021-03-08 VITALS — Wt 138.4 lb

## 2021-03-08 DIAGNOSIS — Z483 Aftercare following surgery for neoplasm: Secondary | ICD-10-CM | POA: Insufficient documentation

## 2021-03-08 NOTE — Therapy (Signed)
[facility] @ [place], [place], [address] Phone: [phone]   Fax:  [phone]  Physical Therapy Treatment  Patient Details  Name: Lauren Ortega MRN: [id number] Date of Birth: 08/01/1980 Referring Provider (PT): Dr. Ninfa Linden   Encounter Date: 03/08/2021   PT End of Session - 03/08/21 1619     Visit Number 7   # unchanged due to screen only   PT Start Time 2761    PT Stop Time 1622    PT Time Calculation (min) 5 min    Activity Tolerance Patient tolerated treatment well    Behavior During Therapy [facility] for tasks assessed/performed             Past Medical History:  Diagnosis Date   Anemia    Anxiety    Breast cancer ([place]) 03/2020   Family history of colon cancer    Family history of lymphoma    Family history of pancreatic cancer    Family history of skin cancer    Hypertension    Port-A-Cath in place 06/16/2020    Past Surgical History:  Procedure Laterality Date   BREAST LUMPECTOMY WITH RADIOACTIVE SEED AND SENTINEL LYMPH NODE BIOPSY Right 05/20/2020   Procedure: RIGHT BREAST LUMPECTOMY WITH RADIOACTIVE SEED AND SENTINEL LYMPH NODE BIOPSY;  Surgeon: Coralie Keens, MD;  Location: [facility];  Service: General;  Laterality: Right;   PORTACATH PLACEMENT Left 05/20/2020   Procedure: INSERTION PORT-A-CATH;  Surgeon: Coralie Keens, MD;  Location: [facility];  Service: General;  Laterality: Left;   TONSILLECTOMY     TUBAL LIGATION      Vitals:   03/08/21 1619  Weight: 138 lb 6 oz (62.8 kg)     Subjective Assessment - 03/08/21 1618     Subjective Pt returns for her 3 month L-Dex screen.    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 Ninfa Linden, HTN, DDD lumbar spine, Developed multiple bloodclots in LUE due to portacath which are now resolved.  Swelling from bloodclots  has not resolved.                    L-DEX FLOWSHEETS - 03/08/21 1600       L-DEX LYMPHEDEMA SCREENING   Measurement Type Unilateral    L-DEX MEASUREMENT EXTREMITY Upper Extremity    POSITION  Standing    DOMINANT SIDE Right    At Risk Side Right    BASELINE SCORE (UNILATERAL) 0.1    L-DEX SCORE (UNILATERAL) -4.2    VALUE CHANGE (UNILAT) -4.3                                     PT Long Term Goals - 09/21/20 1232       PT LONG TERM GOAL #1   Title Pt will be independent is self MLD to reduce Left UE swelling    Time 6    Period Weeks    Status New    Target Date 11/02/20      PT LONG TERM GOAL #2   Title Pt will have reduction in swelling of left arm by 1-2 cm at 15 cm prox to olecranon and 15 cm prox to ulna    Period Weeks    Status New    Target Date 11/02/20  PT LONG TERM GOAL #3   Title Pt will be fit for appropriate compression garments for UE swelling    Time 6    Period Weeks    Status New    Target Date 11/02/20      PT LONG TERM GOAL #4   Title Pt will be independent in self management of lymphedema    Time 6    Period Weeks    Status New    Target Date 11/02/20      PT LONG TERM GOAL #5   Title Pt will note decreased cording at right breast by 50% or greater    Time 6    Period Weeks    Status New    Target Date 11/02/20                   Plan - 03/08/21 1621     Clinical Impression Statement Pt returns for her 3 month L-Dex screen. Her change from baseline of -4.3 is WNLs so no further treatment is required at this time except to cont every 3 month L-Dex screens which pt is agreeable to.    PT Next Visit Plan Cont every 3 month L-Dex screens for up to 2 years from her SLNB (05/21/22)    Consulted and Agree with Plan of Care Patient             Patient will benefit from skilled therapeutic intervention in order to improve the following deficits and impairments:     Visit  Diagnosis: Aftercare following surgery for neoplasm     Problem List Patient Active Problem List   Diagnosis Date Noted   Port-A-Cath in place 06/16/2020   Genetic testing 05/12/2020   Family history of pancreatic cancer    Family history of skin cancer    Family history of lymphoma    Family history of colon cancer    Malignant neoplasm of upper-outer quadrant of right female breast ([place]) 04/16/2020    Otelia Limes, PTA 03/08/2021, 4:23 PM  [facility] @ [address] [place] [place], [place], [address] Phone: [phone]   Fax:  [phone]  Name: Lauren Ortega MRN: [id number] Date of Birth: 07-22-1980

## 2021-03-09 ENCOUNTER — Other Ambulatory Visit (HOSPITAL_COMMUNITY): Payer: Self-pay | Admitting: Hematology

## 2021-03-24 ENCOUNTER — Inpatient Hospital Stay (HOSPITAL_COMMUNITY): Payer: PRIVATE HEALTH INSURANCE

## 2021-03-24 ENCOUNTER — Inpatient Hospital Stay (HOSPITAL_BASED_OUTPATIENT_CLINIC_OR_DEPARTMENT_OTHER): Payer: PRIVATE HEALTH INSURANCE | Admitting: Hematology

## 2021-03-24 ENCOUNTER — Other Ambulatory Visit: Payer: Self-pay

## 2021-03-24 ENCOUNTER — Inpatient Hospital Stay (HOSPITAL_COMMUNITY): Payer: PRIVATE HEALTH INSURANCE | Attending: Hematology

## 2021-03-24 VITALS — BP 115/77 | HR 79 | Temp 98.5°F | Resp 18 | Ht 60.0 in | Wt 138.3 lb

## 2021-03-24 VITALS — BP 108/82 | HR 77 | Temp 98.6°F | Resp 18

## 2021-03-24 DIAGNOSIS — D649 Anemia, unspecified: Secondary | ICD-10-CM | POA: Insufficient documentation

## 2021-03-24 DIAGNOSIS — Z5112 Encounter for antineoplastic immunotherapy: Secondary | ICD-10-CM | POA: Insufficient documentation

## 2021-03-24 DIAGNOSIS — Z95828 Presence of other vascular implants and grafts: Secondary | ICD-10-CM

## 2021-03-24 DIAGNOSIS — C50411 Malignant neoplasm of upper-outer quadrant of right female breast: Secondary | ICD-10-CM

## 2021-03-24 DIAGNOSIS — G629 Polyneuropathy, unspecified: Secondary | ICD-10-CM | POA: Diagnosis not present

## 2021-03-24 DIAGNOSIS — Z79899 Other long term (current) drug therapy: Secondary | ICD-10-CM | POA: Insufficient documentation

## 2021-03-24 DIAGNOSIS — Z170 Estrogen receptor positive status [ER+]: Secondary | ICD-10-CM | POA: Diagnosis not present

## 2021-03-24 DIAGNOSIS — Z7901 Long term (current) use of anticoagulants: Secondary | ICD-10-CM | POA: Insufficient documentation

## 2021-03-24 DIAGNOSIS — Z79810 Long term (current) use of selective estrogen receptor modulators (SERMs): Secondary | ICD-10-CM | POA: Diagnosis not present

## 2021-03-24 LAB — CBC WITH DIFFERENTIAL/PLATELET
Abs Immature Granulocytes: 0.03 10*3/uL (ref 0.00–0.07)
Basophils Absolute: 0.1 10*3/uL (ref 0.0–0.1)
Basophils Relative: 1 %
Eosinophils Absolute: 0.1 10*3/uL (ref 0.0–0.5)
Eosinophils Relative: 2 %
HCT: 35.6 % — ABNORMAL LOW (ref 36.0–46.0)
Hemoglobin: 11.8 g/dL — ABNORMAL LOW (ref 12.0–15.0)
Immature Granulocytes: 1 %
Lymphocytes Relative: 13 %
Lymphs Abs: 0.8 10*3/uL (ref 0.7–4.0)
MCH: 32.3 pg (ref 26.0–34.0)
MCHC: 33.1 g/dL (ref 30.0–36.0)
MCV: 97.5 fL (ref 80.0–100.0)
Monocytes Absolute: 0.7 10*3/uL (ref 0.1–1.0)
Monocytes Relative: 10 %
Neutro Abs: 4.9 10*3/uL (ref 1.7–7.7)
Neutrophils Relative %: 73 %
Platelets: 311 10*3/uL (ref 150–400)
RBC: 3.65 MIL/uL — ABNORMAL LOW (ref 3.87–5.11)
RDW: 12.7 % (ref 11.5–15.5)
WBC: 6.6 10*3/uL (ref 4.0–10.5)
nRBC: 0 % (ref 0.0–0.2)

## 2021-03-24 LAB — MAGNESIUM: Magnesium: 1.8 mg/dL (ref 1.7–2.4)

## 2021-03-24 LAB — COMPREHENSIVE METABOLIC PANEL
ALT: 17 U/L (ref 0–44)
AST: 23 U/L (ref 15–41)
Albumin: 3.7 g/dL (ref 3.5–5.0)
Alkaline Phosphatase: 58 U/L (ref 38–126)
Anion gap: 8 (ref 5–15)
BUN: 19 mg/dL (ref 6–20)
CO2: 26 mmol/L (ref 22–32)
Calcium: 9.3 mg/dL (ref 8.9–10.3)
Chloride: 101 mmol/L (ref 98–111)
Creatinine, Ser: 0.9 mg/dL (ref 0.44–1.00)
GFR, Estimated: >60 mL/min (ref >60)
Glucose, Bld: 81 mg/dL (ref 70–99)
Potassium: 4 mmol/L (ref 3.5–5.1)
Sodium: 135 mmol/L (ref 135–145)
Total Bilirubin: 0.6 mg/dL (ref 0.3–1.2)
Total Protein: 7.2 g/dL (ref 6.5–8.1)

## 2021-03-24 MED ORDER — HEPARIN SOD (PORK) LOCK FLUSH 100 UNIT/ML IV SOLN
500.0000 [IU] | Freq: Once | INTRAVENOUS | Status: AC | PRN
  Administered 2021-03-24: 500 [IU]

## 2021-03-24 MED ORDER — SODIUM CHLORIDE 0.9 % IV SOLN: Freq: Once | INTRAVENOUS | Status: AC

## 2021-03-24 MED ORDER — TRASTUZUMAB-DKST CHEMO 150 MG IV SOLR
6.0000 mg/kg | Freq: Once | INTRAVENOUS | Status: AC
  Administered 2021-03-24: 378 mg via INTRAVENOUS
  Filled 2021-03-24: qty 18

## 2021-03-24 MED ORDER — ANASTROZOLE 1 MG PO TABS: 1.0000 mg | ORAL_TABLET | Freq: Every day | ORAL | 6 refills | Status: DC

## 2021-03-24 MED ORDER — SODIUM CHLORIDE 0.9% FLUSH
10.0000 mL | INTRAVENOUS | Status: DC | PRN
  Administered 2021-03-24 (×2): 10 mL

## 2021-03-24 MED ORDER — DULOXETINE HCL 30 MG PO CPEP: 30.0000 mg | ORAL_CAPSULE | Freq: Every day | ORAL | 0 refills | Status: DC

## 2021-03-24 NOTE — Progress Notes (Signed)
Lauren Ortega, [place] [address]   CLINIC:  Medical Oncology/Hematology  PCP:  [name], [address] / [place] [address] [phone]   REASON FOR VISIT:  Follow-up for right breast cancer  PRIOR THERAPY: none  NGS Results: Her2  CURRENT THERAPY: Docetaxel + Carboplatin + Trastuzumab (TCH) q21d / Trastuzumab q21d  BRIEF ONCOLOGIC HISTORY:  Oncology History  Malignant neoplasm of upper-outer quadrant of right female breast ([place])  04/16/2020 Initial Diagnosis   Malignant neoplasm of upper-outer quadrant of right female breast [facility])    Genetic Testing   Negative genetic testing. No pathogenic variants identified on the [facility] Multi-Cancer Panel+RNA. The report date is 05/10/2020.  The Multi-Cancer Panel + RNA offered by [facility] includes sequencing and/or deletion duplication testing of the following 84 genes: AIP, ALK, APC, ATM, AXIN2,BAP1,  BARD1, BLM, BMPR1A, BRCA1, BRCA2, BRIP1, CASR, CDC73, CDH1, CDK4, CDKN1B, CDKN1C, CDKN2A (p14ARF), CDKN2A (p16INK4a), CEBPA, CHEK2, CTNNA1, DICER1, DIS3L2, EGFR (c.2369C>T, p.Thr790Met variant only), EPCAM (Deletion/duplication testing only), FH, FLCN, GATA2, GPC3, GREM1 (Promoter region deletion/duplication testing only), HOXB13 (c.251G>A, p.Gly84Glu), HRAS, KIT, MAX, MEN1, MET, MITF (c.952G>A, p.Glu318Lys variant only), MLH1, MSH2, MSH3, MSH6, MUTYH, NBN, NF1, NF2, NTHL1, PALB2, PDGFRA, PHOX2B, PMS2, POLD1, POLE, POT1, PRKAR1A, PTCH1, PTEN, RAD50, RAD51C, RAD51D, RB1, RECQL4, RET, RUNX1, SDHAF2, SDHA (sequence changes only), SDHB, SDHC, SDHD, SMAD4, SMARCA4, SMARCB1, SMARCE1, STK11, SUFU, TERC, TERT, TMEM127, TP53, TSC1, TSC2, VHL, WRN and WT1.   06/23/2020 -  Chemotherapy   Patient is on Treatment Plan : BREAST Docetaxel + Carboplatin + Trastuzumab (TCH) q21d / Trastuzumab q21d       CANCER STAGING:  Cancer Staging  Malignant neoplasm of upper-outer quadrant of right female breast  ([place]) Staging form: Breast, AJCC 8th Edition - Clinical stage from 04/21/2020: Stage IA (cT1c, cN0, cM0, G3, ER+, PR-, HER2+) - Unsigned   INTERVAL HISTORY:  Lauren Ortega, a 41 y.o. female, returns for routine follow-up and consideration for next cycle of chemotherapy. [name] was last seen on 03/03/2021.  Due for cycle #14 of Trastuzumab today.   Overall, she tells me she has been feeling pretty well. The cramping and deep pain in her calves and feet have improved, but she now has cramping in her legs and hands starting over the past 3 weeks; these cramps can occur while sitting or standing, and they are not changed by motion. She denies burning sensation. Her hot flashes are stable and tolerable. She is taking iron tablets.   Overall, she feels ready for next cycle of chemo today.   REVIEW OF SYSTEMS:  Review of Systems  Constitutional:  Negative for appetite change and fatigue.  HENT:   Positive for trouble swallowing.   Endocrine: Positive for hot flashes.  Musculoskeletal:  Positive for arthralgias (4/10 feet) and myalgias (leg and hand cramping).  All other systems reviewed and are negative.  PAST MEDICAL/SURGICAL HISTORY:  Past Medical History:  Diagnosis Date   Anemia    Anxiety    Breast cancer ([place]) 03/2020   Family history of colon cancer    Family history of lymphoma    Family history of pancreatic cancer    Family history of skin cancer    Hypertension    Port-A-Cath in place 06/16/2020   Past Surgical History:  Procedure Laterality Date   BREAST LUMPECTOMY WITH RADIOACTIVE SEED AND SENTINEL LYMPH NODE BIOPSY Right 05/20/2020   Procedure: RIGHT BREAST LUMPECTOMY WITH RADIOACTIVE SEED AND SENTINEL LYMPH NODE  BIOPSY;  Surgeon: Coralie Keens, MD;  Location: [facility];  Service: General;  Laterality: Right;   PORTACATH PLACEMENT Left 05/20/2020   Procedure: INSERTION PORT-A-CATH;  Surgeon: Coralie Keens, MD;  Location: [facility];  Service: General;  Laterality: Left;   TONSILLECTOMY     TUBAL LIGATION      SOCIAL HISTORY:  Social History   Socioeconomic History   Marital status: Divorced    Spouse name: Not on file   Number of children: Not on file   Years of education: Not on file   Highest education level: Not on file  Occupational History   Not on file  Tobacco Use   Smoking status: Never   Smokeless tobacco: Never  Substance and Sexual Activity   Alcohol use: Yes    Comment: occassionally   Drug use: Never   Sexual activity: Yes    Birth control/protection: Surgical  Other Topics Concern   Not on file  Social History Narrative   Not on file   Social Determinants of Health   Financial Resource Strain: Low Risk    Difficulty of Paying Living Expenses: Not hard at all  Food Insecurity: No Food Insecurity   Worried About Charity fundraiser in the Last Year: Never true   [place] in the Last Year: Never true  Transportation Needs: No Transportation Needs   Lack of Transportation (Medical): No   Lack of Transportation (Non-Medical): No  Physical Activity: Insufficiently Active   Days of Exercise per Week: 2 days   Minutes of Exercise per Session: 20 min  Stress: No Stress Concern Present   Feeling of Stress : Not at all  Social Connections: Moderately Integrated   Frequency of Communication with Friends and Family: More than three times a week   Frequency of Social Gatherings with Friends and Family: More than three times a week   Attends Religious Services: 1 to 4 times per year   Active Member of [employer] or Organizations: No   Attends Music therapist: 1 to 4 times per year   Marital Status: Divorced  Human resources officer Violence: Not At Risk   Fear of Current or Ex-Partner: No   Emotionally Abused: No   Physically Abused: No   Sexually Abused: No    FAMILY HISTORY:  Family History  Problem Relation Age of Onset   Basal cell carcinoma Mother 30   Bladder  Cancer Maternal Uncle 72   Lymphoma Maternal Uncle 59       [place]    Pancreatic cancer Maternal Grandmother    Non-Hodgkin's lymphoma Maternal Grandfather    Lung cancer Paternal Grandfather    Colon cancer Other        x8 or 9   Colon cancer Maternal Great-grandfather     CURRENT MEDICATIONS:  Current Outpatient Medications  Medication Sig Dispense Refill   amLODipine (NORVASC) 10 MG tablet Take 10 mg by mouth daily.     baclofen (LIORESAL) 20 MG tablet Take 20 mg by mouth 3 (three) times daily as needed.     Baclofen 5 MG TABS Take 5 mg by mouth 3 (three) times daily as needed. 30 tablet 0   CARBOPLATIN IV Inject into the vein every 21 ( twenty-one) days.     diphenoxylate-atropine (LOMOTIL) 2.5-0.025 MG tablet Take 2 tablets by mouth 4 (four) times daily as needed for diarrhea or loose stools. 30 tablet 3   DOCETAXEL IV Inject into the vein  every 21 ( twenty-one) days.     ELIQUIS 5 MG TABS tablet TAKE 1 TABLET BY MOUTH TWICE A DAY 60 tablet 3   FERREX 150 150 MG capsule Take 1 capsule (150 mg total) by mouth daily. 90 capsule 3   folic acid (FOLVITE) 1 MG tablet Take by mouth.     gabapentin (NEURONTIN) 300 MG capsule Take 1 capsule (300 mg total) by mouth 3 (three) times daily. 90 capsule 3   lidocaine-prilocaine (EMLA) cream Apply a small amount to port a cath site and cover with plastic wrap 1 hour prior to infusion appointments 30 g 3   magnesium oxide (MAG-OX) 400 (240 Mg) MG tablet TAKE 1 TABLET BY MOUTH IN THE MORNING AND IN THE EVENING 60 tablet 5   meloxicam (MOBIC) 15 MG tablet Take 15 mg by mouth daily as needed.     oxyCODONE (OXY IR/ROXICODONE) 5 MG immediate release tablet Take 1 tablet (5 mg total) by mouth every 6 (six) hours as needed for moderate pain or severe pain. 25 tablet 0   prochlorperazine (COMPAZINE) 10 MG tablet Take 1 tablet (10 mg total) by mouth every 6 (six) hours as needed (Nausea or vomiting). 30 tablet 1   spironolactone (ALDACTONE) 50 MG  tablet Take 50 mg by mouth every morning.     tamoxifen (NOLVADEX) 20 MG tablet Take 1 tablet (20 mg total) by mouth daily. 30 tablet 5   temazepam (RESTORIL) 15 MG capsule Take 1 capsule (15 mg total) by mouth at bedtime as needed for sleep. 30 capsule 2   trastuzumab in sodium chloride 0.9 % 250 mL Inject into the vein every 21 ( twenty-one) days.     vitamin B-12 (CYANOCOBALAMIN) 1000 MCG tablet Take 1 tablet (1,000 mcg total) by mouth daily. 90 tablet 3   No current facility-administered medications for this visit.    ALLERGIES:  No Known Allergies  PHYSICAL EXAM:  Performance status (ECOG): 0 - Asymptomatic  Vitals:   03/24/21 0948  BP: 115/77  Pulse: 79  Resp: 18  Temp: 98.5 F (36.9 C)  SpO2: 97%   Wt Readings from Last 3 Encounters:  03/24/21 138 lb 4.8 oz (62.7 kg)  03/08/21 138 lb 6 oz (62.8 kg)  03/03/21 138 lb (62.6 kg)   Physical Exam Vitals reviewed.  Constitutional:      Appearance: Normal appearance.  Cardiovascular:     Rate and Rhythm: Normal rate and regular rhythm.     Pulses: Normal pulses.     Heart sounds: Normal heart sounds.  Pulmonary:     Effort: Pulmonary effort is normal.     Breath sounds: Normal breath sounds.  Neurological:     General: No focal deficit present.     Mental Status: She is alert and oriented to person, place, and time.  Psychiatric:        Mood and Affect: Mood normal.        Behavior: Behavior normal.    LABORATORY DATA:  I have reviewed the labs as listed.  CBC Latest Ref Rng & Units 03/03/2021 02/10/2021 01/20/2021  WBC 4.0 - 10.5 K/uL 6.3 5.6 6.1  Hemoglobin 12.0 - 15.0 g/dL 11.9(L) 12.0 12.4  Hematocrit 36.0 - 46.0 % 35.2(L) 35.9(L) 36.3  Platelets 150 - 400 K/uL 301 279 256   CMP Latest Ref Rng & Units 03/03/2021 02/10/2021 01/20/2021  Glucose 70 - 99 mg/dL 91 100(H) 96  BUN 6 - 20 mg/dL '17 19 14  ' Creatinine 0.44 -  1.00 mg/dL 0.76 0.75 0.67  Sodium 135 - 145 mmol/L 136 136 135  Potassium 3.5 - 5.1 mmol/L 3.7  3.8 3.9  Chloride 98 - 111 mmol/L 103 100 102  CO2 22 - 32 mmol/L '26 25 25  ' Calcium 8.9 - 10.3 mg/dL 9.2 9.4 8.9  Total Protein 6.5 - 8.1 g/dL 6.9 7.1 7.0  Total Bilirubin 0.3 - 1.2 mg/dL 0.4 0.7 0.3  Alkaline Phos 38 - 126 U/L 58 56 59  AST 15 - 41 U/L '23 22 22  ' ALT 0 - 44 U/L '15 17 14    ' DIAGNOSTIC IMAGING:  I have independently reviewed the scans and discussed with the patient. ECHOCARDIOGRAM COMPLETE  Result Date: 02/24/2021    ECHOCARDIOGRAM REPORT   Patient Name:   [facility] Date of Exam: 02/24/2021 Medical Rec #:  [id number]      Height:       60.0 in Accession #:    [id number]     Weight:       137.0 lb Date of Birth:  [birth year]      BSA:          1.589 m Patient Age:    41 years       BP:           131/75 mmHg Patient Gender: F              HR:           81 bpm. Exam Location:  [facility] Procedure: 2D Echo, Cardiac Doppler and Color Doppler Indications:    C50.411,Z17.0 (ICD-10-CM) - Malignant neoplasm of upper-outer                 quadrant of right breast in female, estrogen receptor positive                 ([place])  History:        Patient has prior history of Echocardiogram examinations, most                 recent 11/23/2020. Risk Factors:Hypertension. Malignant neoplasm                 of upper-outer quadrant of right breast in female, estrogen                 receptor positive ([place]).  Sonographer:    Alvino Chapel RCS Referring Phys: [phone] [place]  1. Left ventricular ejection fraction, by estimation, is 50 to 55%. The left ventricle has low normal function. The left ventricle has no regional wall motion abnormalities. Left ventricular diastolic parameters were normal.  2. Right ventricular systolic function is normal. The right ventricular size is normal. There is normal pulmonary artery systolic pressure.  3. Left atrial size was mildly dilated.  4. The mitral valve is normal in structure. No evidence of mitral valve regurgitation. No evidence of mitral  stenosis.  5. The aortic valve has an indeterminant number of cusps. Aortic valve regurgitation is not visualized. No aortic stenosis is present.  6. The inferior vena cava is normal in size with greater than 50% respiratory variability, suggesting right atrial pressure of 3 mmHg. FINDINGS  Left Ventricle: Left ventricular ejection fraction, by estimation, is 50 to 55%. The left ventricle has low normal function. The left ventricle has no regional wall motion abnormalities. The left ventricular internal cavity size was normal in size. There is no left ventricular hypertrophy. Left ventricular diastolic parameters were normal. Right Ventricle:  The right ventricular size is normal. No increase in right ventricular wall thickness. Right ventricular systolic function is normal. There is normal pulmonary artery systolic pressure. The tricuspid regurgitant velocity is 2.45 m/s, and  with an assumed right atrial pressure of 3 mmHg, the estimated right ventricular systolic pressure is 58.5 mmHg. Left Atrium: Left atrial size was mildly dilated. Right Atrium: Right atrial size was normal in size. Pericardium: There is no evidence of pericardial effusion. Mitral Valve: The mitral valve is normal in structure. No evidence of mitral valve regurgitation. No evidence of mitral valve stenosis. Tricuspid Valve: The tricuspid valve is normal in structure. Tricuspid valve regurgitation is mild . No evidence of tricuspid stenosis. Aortic Valve: The aortic valve has an indeterminant number of cusps. Aortic valve regurgitation is not visualized. No aortic stenosis is present. Aortic valve mean gradient measures 8.5 mmHg. Aortic valve peak gradient measures 18.1 mmHg. Aortic valve area, by VTI measures 1.66 cm. Pulmonic Valve: The pulmonic valve was not well visualized. Pulmonic valve regurgitation is not visualized. No evidence of pulmonic stenosis. Aorta: The aortic root is normal in size and structure. Venous: The inferior vena cava  is normal in size with greater than 50% respiratory variability, suggesting right atrial pressure of 3 mmHg. IAS/Shunts: No atrial level shunt detected by color flow Doppler.  LEFT VENTRICLE PLAX 2D LVIDd:         4.40 cm     Diastology LVIDs:         3.30 cm     LV e' medial:    11.70 cm/s LV PW:         0.90 cm     LV E/e' medial:  8.4 LV IVS:        0.90 cm     LV e' lateral:   14.50 cm/s LVOT diam:     1.80 cm     LV E/e' lateral: 6.8 LV SV:         61 LV SV Index:   38 LVOT Area:     2.54 cm  LV Volumes (MOD) LV vol d, MOD A2C: 78.6 ml LV vol d, MOD A4C: 86.7 ml LV vol s, MOD A2C: 37.7 ml LV vol s, MOD A4C: 42.7 ml LV SV MOD A2C:     40.9 ml LV SV MOD A4C:     86.7 ml LV SV MOD BP:      43.9 ml RIGHT VENTRICLE RV S prime:     14.10 cm/s TAPSE (M-mode): 2.3 cm LEFT ATRIUM             Index        RIGHT ATRIUM           Index LA diam:        4.00 cm 2.52 cm/m   RA Area:     15.70 cm LA Vol (A2C):   57.1 ml 35.93 ml/m  RA Volume:   41.10 ml  25.86 ml/m LA Vol (A4C):   58.9 ml 37.06 ml/m LA Biplane Vol: 59.9 ml 37.69 ml/m  AORTIC VALVE AV Area (Vmax):    1.34 cm AV Area (Vmean):   1.37 cm AV Area (VTI):     1.66 cm AV Vmax:           212.43 cm/s AV Vmean:          135.944 cm/s AV VTI:            0.369 m AV Peak Grad:  18.1 mmHg AV Mean Grad:      8.5 mmHg LVOT Vmax:         112.00 cm/s LVOT Vmean:        73.400 cm/s LVOT VTI:          0.240 m LVOT/AV VTI ratio: 0.65  AORTA Ao Root diam: 2.80 cm MITRAL VALVE               TRICUSPID VALVE MV Area (PHT): 3.17 cm    TR Peak grad:   24.0 mmHg MV Decel Time: 239 msec    TR Vmax:        245.00 cm/s MV E velocity: 98.20 cm/s MV A velocity: 84.80 cm/s  SHUNTS MV E/A ratio:  1.16        Systemic VTI:  0.24 m                            Systemic Diam: 1.80 cm Carlyle Dolly MD Electronically signed by Carlyle Dolly MD Signature Date/Time: 02/24/2021/5:04:55 PM    Final      ASSESSMENT:  1.  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  She had mammogram/ultrasound on 03/18/2020 done in [place] which showed a 1.4 x 0.9 x 1.5 cm mass in the upper outer quadrant of the right breast. -Biopsy on 03/31/2020 consistent with intermediate to high-grade infiltrating ductal carcinoma of the right breast at 10:30 position, HER-2 3+ positive, Ki-67 40%, ER weak staining in less than 10% of tumor cells, PR negative. -MRI of the breast on 04/20/2020 with 1.3 x 1.5 x 1.4 cm irregular enhancing mass within the upper outer right breast.  No abnormal appearing lymph nodes. - Right breast lumpectomy and SLNB on 05/20/2020- grade 3 IDC, 1.8 cm, invasive carcinoma is less than 1 mm from anterior margin focally and less than 1 mm from the posterior margin broadly.  Margins negative for in situ carcinoma.  Lymphovascular invasion present.  0/5 lymph nodes positive.  There is focal ductal carcinoma within the vascular space in the soft tissue adjacent to the lymph node.  No carcinoma is seen within the lymph nodes.  PT1CPN0. - Genetic testing was negative. - 6 cycles of [place] from 06/23/2020 through 10/07/2020. - XRT to the breast, 20 fractions completed on 01/15/2021 with Dr. Barnetta Chapel in [place].   2.  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  Never smoker. -Maternal grandmother had colon cancer.  Maternal grandfather died of non-Hodgkin's lymphoma.  Paternal grandfather died of lung cancer.  Maternal uncle had multiple myeloma.  Mother had basal cell skin cancer.  3.  Left upper extremity DVT: - Doppler on 07/06/2020 positive for acute DVT in the left axillary, peripheral subclavian vein and superficial thrombosis involving left basilic vein. - This is port induced and she was started on Eliquis.   PLAN:  1.  Stage I (T1 cN0 M0) right breast IDC, HER2 positive: - At last visit we have discontinued tamoxifen due to leg pains.   She reported improvement in deep leg pains and ankle cramps since then. - Reviewed estradiol and FSH levels which are in the postmenopausal range.  She did not have any.  Since the start of chemotherapy in June. - We discussed that we can start her on aromatase inhibitor but she might still be able  to start menstruating in the future.  If that is the case, I have recommended LHRH agonist along with aromatase inhibitor. - We will start her on anastrozole at this time. - She will proceed with Herceptin today and every 3 weeks.  I plan to see her back in 9 weeks to see how she is tolerating.   2.  High risk drug monitoring: - 2D echo on 02/24/2021 with EF 50-55%.  No clinical signs or symptoms of PND or orthopnea.   3.  Normocytic anemia: - Hemoglobin 11.8.  Continue oral iron.  4.  Left upper extremity DVT: - Continue Eliquis twice daily.  No bleeding issues.  5.  Hypomagnesemia: - Continue magnesium twice daily.  Magnesium normal today.  6.  Peripheral neuropathy: - She reports improvement in deep leg pains and ankle cramps since tamoxifen discontinued. - However she has cramping in her hands on and off along with cramping on the top of the feet and toes which started 3 weeks ago.  This could be a manifestation of peripheral neuropathy.  She has tried gabapentin in the past without great improvement.  She denies any burning pains.  She has worsening sleep as a result of it. - Recommend Cymbalta 30 mg on week 1 followed by 60 mg daily.   Orders placed this encounter:  No orders of the defined types were placed in this encounter.    Derek Jack, MD [facility] [phone]   I, Thana Ates, am acting as a scribe for Dr. Derek Jack.  I, Derek Jack MD, have reviewed the above documentation for accuracy and completeness, and I agree with the above.

## 2021-03-24 NOTE — Progress Notes (Signed)
Patient presents today for [place]. Patient okay for treatment today per Dr. Delton Coombes. Patient reports taking pre-medications at home this morning. Patient tolerated chemotherapy with no complaints voiced. Side effects with management reviewed understanding verbalized. Port site clean and dry with no bruising or swelling noted at site. Good blood return noted before and after administration of chemotherapy. Band aid applied. Patient left in satisfactory condition with VSS and no s/s of distress noted.  ?

## 2021-03-24 NOTE — Progress Notes (Signed)
Patient has been examined by Dr. Katragadda, and vital signs and labs have been reviewed. ANC, Creatinine, LFTs, hemoglobin, and platelets are within treatment parameters per M.D. - pt may proceed with treatment.    °

## 2021-03-24 NOTE — Patient Instructions (Addendum)
[name] at [facility] ?Discharge Instructions ? ? ?You were seen and examined today by Dr. Delton Coombes. ? ?He reviewed the results of your lab work today which is normal/stable.  ? ?We will send Cymbalta prescription to see if this will help with your neuropathy. Take at bedtime. ? ?We will send prescription for anastrozole to take daily to block estrogen.  ? ?We will proceed with your infusion today. ? ?Return as scheduled.  ? ? ?Thank you for choosing [facility] at [facility] to provide your oncology and hematology care.  To afford each patient quality time with our provider, please arrive at least 15 minutes before your scheduled appointment time.  ? ?If you have a lab appointment with the [facility] please come in thru the Main Entrance and check in at the main information desk. ? ?You need to re-schedule your appointment should you arrive 10 or more minutes late.  We strive to give you quality time with our providers, and arriving late affects you and other patients whose appointments are after yours.  Also, if you no show three or more times for appointments you may be dismissed from the clinic at the providers discretion.     ?Again, thank you for choosing [facility].  Our hope is that these requests will decrease the amount of time that you wait before being seen by our physicians.       ?_____________________________________________________________ ? ?Should you have questions after your visit to [facility], please contact our office at [phone] and follow the prompts.  Our office hours are 8:00 a.m. and 4:30 p.m. Monday - Friday.  Please note that voicemails left after 4:00 p.m. may not be returned until the following business day.  We are closed weekends and major holidays.  You do have access to a nurse 24-7, just call the main number to the clinic [phone] and do not press any options, hold on the line and a nurse  will answer the phone.   ? ?For prescription refill requests, have your pharmacy contact our office and allow 72 hours.   ? ?Due to Covid, you will need to wear a mask upon entering the hospital. If you do not have a mask, a mask will be given to you at the Main Entrance upon arrival. For doctor visits, patients may have 1 support person age 26 or older with them. For treatment visits, patients can not have anyone with them due to social distancing guidelines and our immunocompromised population.  ? ?   ?

## 2021-03-24 NOTE — Patient Instructions (Signed)
[facility]  Discharge Instructions: ?Thank you for choosing [facility] to provide your oncology and hematology care.  ?If you have a lab appointment with the [facility], please come in thru the Main Entrance and check in at the main information desk. ? ?Wear comfortable clothing and clothing appropriate for easy access to any Portacath or PICC line.  ? ?We strive to give you quality time with your provider. You may need to reschedule your appointment if you arrive late (15 or more minutes).  Arriving late affects you and other patients whose appointments are after yours.  Also, if you miss three or more appointments without notifying the office, you may be dismissed from the clinic at the provider?s discretion.    ?  ?For prescription refill requests, have your pharmacy contact our office and allow 72 hours for refills to be completed.   ? ?Today you received the following chemotherapy and/or immunotherapy agents Ogivri, return as scheduled. ?  ?To help prevent nausea and vomiting after your treatment, we encourage you to take your nausea medication as directed. ? ?BELOW ARE SYMPTOMS THAT SHOULD BE REPORTED IMMEDIATELY: ?*FEVER GREATER THAN 100.4 F (38 ?C) OR HIGHER ?*CHILLS OR SWEATING ?*NAUSEA AND VOMITING THAT IS NOT CONTROLLED WITH YOUR NAUSEA MEDICATION ?*UNUSUAL SHORTNESS OF BREATH ?*UNUSUAL BRUISING OR BLEEDING ?*URINARY PROBLEMS (pain or burning when urinating, or frequent urination) ?*BOWEL PROBLEMS (unusual diarrhea, constipation, pain near the anus) ?TENDERNESS IN MOUTH AND THROAT WITH OR WITHOUT PRESENCE OF ULCERS (sore throat, sores in mouth, or a toothache) ?UNUSUAL RASH, SWELLING OR PAIN  ?UNUSUAL VAGINAL DISCHARGE OR ITCHING  ? ?Items with * indicate a potential emergency and should be followed up as soon as possible or go to the Emergency Department if any problems should occur. ? ?Please show the CHEMOTHERAPY ALERT CARD or IMMUNOTHERAPY ALERT CARD at check-in to the  Emergency Department and triage nurse. ? ?Should you have questions after your visit or need to cancel or reschedule your appointment, please contact [facility] [phone]  and follow the prompts.  Office hours are 8:00 a.m. to 4:30 p.m. Monday - Friday. Please note that voicemails left after 4:00 p.m. may not be returned until the following business day.  We are closed weekends and major holidays. You have access to a nurse at all times for urgent questions. Please call the main number to the clinic [phone] and follow the prompts. ? ?For any non-urgent questions, you may also contact your provider using MyChart. We now offer e-Visits for anyone 43 and older to request care online for non-urgent symptoms. For details visit [email or web address] ?  ?Also download the MyChart app! Go to the app store, search "MyChart", open the app, select [facility], and log in with your MyChart username and password. ? ?Due to Covid, a mask is required upon entering the hospital/clinic. If you do not have a mask, one will be given to you upon arrival. For doctor visits, patients may have 1 support person aged 43 or older with them. For treatment visits, patients cannot have anyone with them due to current Covid guidelines and our immunocompromised population.  ?

## 2021-03-30 ENCOUNTER — Other Ambulatory Visit: Payer: Self-pay

## 2021-03-30 ENCOUNTER — Encounter (HOSPITAL_COMMUNITY): Payer: Self-pay

## 2021-03-30 ENCOUNTER — Ambulatory Visit (HOSPITAL_COMMUNITY)
Admission: RE | Admit: 2021-03-30 | Discharge: 2021-03-30 | Disposition: A | Discharge status: Home / Self Care | Payer: PRIVATE HEALTH INSURANCE | Source: Ambulatory Visit | Attending: Hematology | Admitting: Hematology

## 2021-03-30 DIAGNOSIS — Z9889 Other specified postprocedural states: Secondary | ICD-10-CM | POA: Diagnosis present

## 2021-03-30 DIAGNOSIS — C50411 Malignant neoplasm of upper-outer quadrant of right female breast: Secondary | ICD-10-CM | POA: Insufficient documentation

## 2021-03-30 DIAGNOSIS — Z170 Estrogen receptor positive status [ER+]: Secondary | ICD-10-CM | POA: Diagnosis present

## 2021-04-06 ENCOUNTER — Other Ambulatory Visit (HOSPITAL_COMMUNITY): Payer: Self-pay | Admitting: Hematology

## 2021-04-13 ENCOUNTER — Encounter (HOSPITAL_COMMUNITY): Payer: Self-pay

## 2021-04-14 ENCOUNTER — Encounter (HOSPITAL_COMMUNITY): Payer: Self-pay

## 2021-04-14 ENCOUNTER — Inpatient Hospital Stay (HOSPITAL_COMMUNITY): Payer: PRIVATE HEALTH INSURANCE

## 2021-04-14 ENCOUNTER — Ambulatory Visit (HOSPITAL_COMMUNITY): Payer: PRIVATE HEALTH INSURANCE | Admitting: Hematology

## 2021-04-14 ENCOUNTER — Other Ambulatory Visit: Payer: Self-pay

## 2021-04-14 VITALS — BP 112/73 | HR 74 | Temp 98.1°F | Resp 18

## 2021-04-14 DIAGNOSIS — Z170 Estrogen receptor positive status [ER+]: Secondary | ICD-10-CM

## 2021-04-14 DIAGNOSIS — Z5112 Encounter for antineoplastic immunotherapy: Secondary | ICD-10-CM | POA: Diagnosis not present

## 2021-04-14 DIAGNOSIS — Z95828 Presence of other vascular implants and grafts: Secondary | ICD-10-CM

## 2021-04-14 LAB — CBC WITH DIFFERENTIAL/PLATELET
Abs Immature Granulocytes: 0.01 10*3/uL (ref 0.00–0.07)
Basophils Absolute: 0.1 10*3/uL (ref 0.0–0.1)
Basophils Relative: 1 %
Eosinophils Absolute: 0.2 10*3/uL (ref 0.0–0.5)
Eosinophils Relative: 3 %
HCT: 37.8 % (ref 36.0–46.0)
Hemoglobin: 12.9 g/dL (ref 12.0–15.0)
Immature Granulocytes: 0 %
Lymphocytes Relative: 16 %
Lymphs Abs: 1 10*3/uL (ref 0.7–4.0)
MCH: 33.9 pg (ref 26.0–34.0)
MCHC: 34.1 g/dL (ref 30.0–36.0)
MCV: 99.5 fL (ref 80.0–100.0)
Monocytes Absolute: 0.6 10*3/uL (ref 0.1–1.0)
Monocytes Relative: 11 %
Neutro Abs: 4.2 10*3/uL (ref 1.7–7.7)
Neutrophils Relative %: 69 %
Platelets: 304 10*3/uL (ref 150–400)
RBC: 3.8 MIL/uL — ABNORMAL LOW (ref 3.87–5.11)
RDW: 12.1 % (ref 11.5–15.5)
WBC: 6.1 10*3/uL (ref 4.0–10.5)
nRBC: 0 % (ref 0.0–0.2)

## 2021-04-14 LAB — COMPREHENSIVE METABOLIC PANEL
ALT: 23 U/L (ref 0–44)
AST: 25 U/L (ref 15–41)
Albumin: 3.8 g/dL (ref 3.5–5.0)
Alkaline Phosphatase: 76 U/L (ref 38–126)
Anion gap: 6 (ref 5–15)
BUN: 17 mg/dL (ref 6–20)
CO2: 27 mmol/L (ref 22–32)
Calcium: 9.3 mg/dL (ref 8.9–10.3)
Chloride: 104 mmol/L (ref 98–111)
Creatinine, Ser: 0.7 mg/dL (ref 0.44–1.00)
GFR, Estimated: >60 mL/min (ref >60)
Glucose, Bld: 76 mg/dL (ref 70–99)
Potassium: 4.1 mmol/L (ref 3.5–5.1)
Sodium: 137 mmol/L (ref 135–145)
Total Bilirubin: 0.4 mg/dL (ref 0.3–1.2)
Total Protein: 7 g/dL (ref 6.5–8.1)

## 2021-04-14 LAB — IRON AND TIBC
Iron: 74 ug/dL (ref 28–170)
Saturation Ratios: 17 % (ref 10.4–31.8)
TIBC: 445 ug/dL (ref 250–450)
UIBC: 371 ug/dL

## 2021-04-14 LAB — MAGNESIUM: Magnesium: 2 mg/dL (ref 1.7–2.4)

## 2021-04-14 LAB — FERRITIN: Ferritin: 19 ng/mL (ref 11–307)

## 2021-04-14 MED ORDER — SODIUM CHLORIDE 0.9% FLUSH
10.0000 mL | INTRAVENOUS | Status: DC | PRN
  Administered 2021-04-14: 10 mL

## 2021-04-14 MED ORDER — HEPARIN SOD (PORK) LOCK FLUSH 100 UNIT/ML IV SOLN
500.0000 [IU] | Freq: Once | INTRAVENOUS | Status: AC | PRN
  Administered 2021-04-14: 500 [IU]

## 2021-04-14 MED ORDER — SODIUM CHLORIDE 0.9 % IV SOLN: Freq: Once | INTRAVENOUS | Status: AC

## 2021-04-14 MED ORDER — TRASTUZUMAB-DKST CHEMO 150 MG IV SOLR
6.0000 mg/kg | Freq: Once | INTRAVENOUS | Status: AC
  Administered 2021-04-14: 378 mg via INTRAVENOUS
  Filled 2021-04-14: qty 18

## 2021-04-14 NOTE — Patient Instructions (Signed)
[facility]  Discharge Instructions: ?Thank you for choosing [facility] to provide your oncology and hematology care.  ?If you have a lab appointment with the [facility], please come in thru the Main Entrance and check in at the main information desk. ? ?Wear comfortable clothing and clothing appropriate for easy access to any Portacath or PICC line.  ? ?We strive to give you quality time with your provider. You may need to reschedule your appointment if you arrive late (15 or more minutes).  Arriving late affects you and other patients whose appointments are after yours.  Also, if you miss three or more appointments without notifying the office, you may be dismissed from the clinic at the provider?s discretion.    ?  ?For prescription refill requests, have your pharmacy contact our office and allow 72 hours for refills to be completed.   ? ?Today you received the following chemotherapy : Ogivri.     ?  ?To help prevent nausea and vomiting after your treatment, we encourage you to take your nausea medication as directed. ? ?BELOW ARE SYMPTOMS THAT SHOULD BE REPORTED IMMEDIATELY: ?*FEVER GREATER THAN 100.4 F (38 ?C) OR HIGHER ?*CHILLS OR SWEATING ?*NAUSEA AND VOMITING THAT IS NOT CONTROLLED WITH YOUR NAUSEA MEDICATION ?*UNUSUAL SHORTNESS OF BREATH ?*UNUSUAL BRUISING OR BLEEDING ?*URINARY PROBLEMS (pain or burning when urinating, or frequent urination) ?*BOWEL PROBLEMS (unusual diarrhea, constipation, pain near the anus) ?TENDERNESS IN MOUTH AND THROAT WITH OR WITHOUT PRESENCE OF ULCERS (sore throat, sores in mouth, or a toothache) ?UNUSUAL RASH, SWELLING OR PAIN  ?UNUSUAL VAGINAL DISCHARGE OR ITCHING  ? ?Items with * indicate a potential emergency and should be followed up as soon as possible or go to the Emergency Department if any problems should occur. ? ?Please show the CHEMOTHERAPY ALERT CARD or IMMUNOTHERAPY ALERT CARD at check-in to the Emergency Department and triage  nurse. ? ?Should you have questions after your visit or need to cancel or reschedule your appointment, please contact [facility] [phone]  and follow the prompts.  Office hours are 8:00 a.m. to 4:30 p.m. Monday - Friday. Please note that voicemails left after 4:00 p.m. may not be returned until the following business day.  We are closed weekends and major holidays. You have access to a nurse at all times for urgent questions. Please call the main number to the clinic [phone] and follow the prompts. ? ?For any non-urgent questions, you may also contact your provider using MyChart. We now offer e-Visits for anyone 57 and older to request care online for non-urgent symptoms. For details visit [email or web address] ?  ?Also download the MyChart app! Go to the app store, search "MyChart", open the app, select [facility], and log in with your MyChart username and password. ? ?Due to Covid, a mask is required upon entering the hospital/clinic. If you do not have a mask, one will be given to you upon arrival. For doctor visits, patients may have 1 support person aged 38 or older with them. For treatment visits, patients cannot have anyone with them due to current Covid guidelines and our immunocompromised population.  ?

## 2021-04-14 NOTE — Patient Instructions (Signed)
[facility]  Discharge Instructions: ?Thank you for choosing [facility] to provide your oncology and hematology care.  ?If you have a lab appointment with the [facility], please come in thru the Main Entrance and check in at the main information desk. ? ?Wear comfortable clothing and clothing appropriate for easy access to any Portacath or PICC line.  ? ?We strive to give you quality time with your provider. You may need to reschedule your appointment if you arrive late (15 or more minutes).  Arriving late affects you and other patients whose appointments are after yours.  Also, if you miss three or more appointments without notifying the office, you may be dismissed from the clinic at the provider?s discretion.    ?  ?For prescription refill requests, have your pharmacy contact our office and allow 72 hours for refills to be completed.   ? ?Today you received the following chemotherapy and/or immunotherapy agents port flush labs    ?  ?To help prevent nausea and vomiting after your treatment, we encourage you to take your nausea medication as directed. ? ?BELOW ARE SYMPTOMS THAT SHOULD BE REPORTED IMMEDIATELY: ?*FEVER GREATER THAN 100.4 F (38 ?C) OR HIGHER ?*CHILLS OR SWEATING ?*NAUSEA AND VOMITING THAT IS NOT CONTROLLED WITH YOUR NAUSEA MEDICATION ?*UNUSUAL SHORTNESS OF BREATH ?*UNUSUAL BRUISING OR BLEEDING ?*URINARY PROBLEMS (pain or burning when urinating, or frequent urination) ?*BOWEL PROBLEMS (unusual diarrhea, constipation, pain near the anus) ?TENDERNESS IN MOUTH AND THROAT WITH OR WITHOUT PRESENCE OF ULCERS (sore throat, sores in mouth, or a toothache) ?UNUSUAL RASH, SWELLING OR PAIN  ?UNUSUAL VAGINAL DISCHARGE OR ITCHING  ? ?Items with * indicate a potential emergency and should be followed up as soon as possible or go to the Emergency Department if any problems should occur. ? ?Please show the CHEMOTHERAPY ALERT CARD or IMMUNOTHERAPY ALERT CARD at check-in to the Emergency  Department and triage nurse. ? ?Should you have questions after your visit or need to cancel or reschedule your appointment, please contact [facility] [phone]  and follow the prompts.  Office hours are 8:00 a.m. to 4:30 p.m. Monday - Friday. Please note that voicemails left after 4:00 p.m. may not be returned until the following business day.  We are closed weekends and major holidays. You have access to a nurse at all times for urgent questions. Please call the main number to the clinic [phone] and follow the prompts. ? ?For any non-urgent questions, you may also contact your provider using MyChart. We now offer e-Visits for anyone 51 and older to request care online for non-urgent symptoms. For details visit [email or web address] ?  ?Also download the MyChart app! Go to the app store, search "MyChart", open the app, select [facility], and log in with your MyChart username and password. ? ?Due to Covid, a mask is required upon entering the hospital/clinic. If you do not have a mask, one will be given to you upon arrival. For doctor visits, patients may have 1 support person aged 29 or older with them. For treatment visits, patients cannot have anyone with them due to current Covid guidelines and our immunocompromised population.  ?

## 2021-04-14 NOTE — Progress Notes (Signed)
Treatment given today per MD orders. Tolerated infusion without adverse affects. Vital signs stable. No complaints at this time. Discharged from clinic ambulatory in stable condition. Alert and oriented x 3. F/U with [facility] as scheduled.   

## 2021-04-14 NOTE — Progress Notes (Signed)
Patients port flushed without difficulty.  Good blood return noted with no bruising or swelling noted at site.  Stable during access and blood draw.  Patient to remain accessed for treatment. 

## 2021-04-14 NOTE — Progress Notes (Signed)
Patient presents today for treatment. Patient reports feeling very tired over the last two weeks but feels up for treatment today. Dr. Delton Coombes made aware of patient symptoms and patient is okay to have treatment today with additional orders received to draw ferritin and iron panel lab work. Patient reports taking pre-medications at home, pharmacy aware. ?

## 2021-04-15 ENCOUNTER — Other Ambulatory Visit (HOSPITAL_COMMUNITY): Payer: Self-pay | Admitting: Hematology

## 2021-04-29 ENCOUNTER — Encounter (HOSPITAL_COMMUNITY): Payer: Self-pay

## 2021-05-04 DIAGNOSIS — D509 Iron deficiency anemia, unspecified: Secondary | ICD-10-CM | POA: Insufficient documentation

## 2021-05-04 NOTE — Progress Notes (Signed)
Orders received to add Feraheme 510 mg IVPB x 1 to appointment 05/05/21. ? ?T.O. Dr Rhys Martini, PharmD ?

## 2021-05-05 ENCOUNTER — Inpatient Hospital Stay (HOSPITAL_COMMUNITY): Payer: PRIVATE HEALTH INSURANCE | Attending: Hematology

## 2021-05-05 ENCOUNTER — Inpatient Hospital Stay (HOSPITAL_COMMUNITY): Payer: PRIVATE HEALTH INSURANCE

## 2021-05-05 VITALS — BP 114/80 | HR 84 | Temp 98.3°F | Resp 18

## 2021-05-05 DIAGNOSIS — Z170 Estrogen receptor positive status [ER+]: Secondary | ICD-10-CM | POA: Insufficient documentation

## 2021-05-05 DIAGNOSIS — Z79899 Other long term (current) drug therapy: Secondary | ICD-10-CM | POA: Diagnosis not present

## 2021-05-05 DIAGNOSIS — Z79810 Long term (current) use of selective estrogen receptor modulators (SERMs): Secondary | ICD-10-CM | POA: Insufficient documentation

## 2021-05-05 DIAGNOSIS — Z7901 Long term (current) use of anticoagulants: Secondary | ICD-10-CM | POA: Diagnosis not present

## 2021-05-05 DIAGNOSIS — Z5112 Encounter for antineoplastic immunotherapy: Secondary | ICD-10-CM | POA: Diagnosis present

## 2021-05-05 DIAGNOSIS — D649 Anemia, unspecified: Secondary | ICD-10-CM | POA: Diagnosis not present

## 2021-05-05 DIAGNOSIS — Z95828 Presence of other vascular implants and grafts: Secondary | ICD-10-CM

## 2021-05-05 DIAGNOSIS — C50411 Malignant neoplasm of upper-outer quadrant of right female breast: Secondary | ICD-10-CM | POA: Diagnosis present

## 2021-05-05 DIAGNOSIS — D509 Iron deficiency anemia, unspecified: Secondary | ICD-10-CM

## 2021-05-05 DIAGNOSIS — G629 Polyneuropathy, unspecified: Secondary | ICD-10-CM | POA: Insufficient documentation

## 2021-05-05 LAB — CBC WITH DIFFERENTIAL/PLATELET
Abs Immature Granulocytes: 0.02 10*3/uL (ref 0.00–0.07)
Basophils Absolute: 0 10*3/uL (ref 0.0–0.1)
Basophils Relative: 1 %
Eosinophils Absolute: 0.1 10*3/uL (ref 0.0–0.5)
Eosinophils Relative: 3 %
HCT: 35.4 % — ABNORMAL LOW (ref 36.0–46.0)
Hemoglobin: 12.1 g/dL (ref 12.0–15.0)
Immature Granulocytes: 0 %
Lymphocytes Relative: 17 %
Lymphs Abs: 0.9 10*3/uL (ref 0.7–4.0)
MCH: 33.3 pg (ref 26.0–34.0)
MCHC: 34.2 g/dL (ref 30.0–36.0)
MCV: 97.5 fL (ref 80.0–100.0)
Monocytes Absolute: 0.7 10*3/uL (ref 0.1–1.0)
Monocytes Relative: 12 %
Neutro Abs: 3.6 10*3/uL (ref 1.7–7.7)
Neutrophils Relative %: 67 %
Platelets: 278 10*3/uL (ref 150–400)
RBC: 3.63 MIL/uL — ABNORMAL LOW (ref 3.87–5.11)
RDW: 11.9 % (ref 11.5–15.5)
WBC: 5.4 10*3/uL (ref 4.0–10.5)
nRBC: 0 % (ref 0.0–0.2)

## 2021-05-05 LAB — COMPREHENSIVE METABOLIC PANEL
ALT: 27 U/L (ref 0–44)
AST: 32 U/L (ref 15–41)
Albumin: 3.6 g/dL (ref 3.5–5.0)
Alkaline Phosphatase: 81 U/L (ref 38–126)
Anion gap: 6 (ref 5–15)
BUN: 18 mg/dL (ref 6–20)
CO2: 25 mmol/L (ref 22–32)
Calcium: 8.9 mg/dL (ref 8.9–10.3)
Chloride: 103 mmol/L (ref 98–111)
Creatinine, Ser: 0.73 mg/dL (ref 0.44–1.00)
GFR, Estimated: >60 mL/min (ref >60)
Glucose, Bld: 84 mg/dL (ref 70–99)
Potassium: 3.7 mmol/L (ref 3.5–5.1)
Sodium: 134 mmol/L — ABNORMAL LOW (ref 135–145)
Total Bilirubin: 0.3 mg/dL (ref 0.3–1.2)
Total Protein: 6.8 g/dL (ref 6.5–8.1)

## 2021-05-05 LAB — MAGNESIUM: Magnesium: 1.9 mg/dL (ref 1.7–2.4)

## 2021-05-05 MED ORDER — HEPARIN SOD (PORK) LOCK FLUSH 100 UNIT/ML IV SOLN
500.0000 [IU] | Freq: Once | INTRAVENOUS | Status: AC | PRN
  Administered 2021-05-05: 500 [IU]

## 2021-05-05 MED ORDER — SODIUM CHLORIDE 0.9% FLUSH
10.0000 mL | INTRAVENOUS | Status: DC | PRN
  Administered 2021-05-05: 10 mL

## 2021-05-05 MED ORDER — SODIUM CHLORIDE 0.9 % IV SOLN: Freq: Once | INTRAVENOUS | Status: AC

## 2021-05-05 MED ORDER — TRASTUZUMAB-DKST CHEMO 150 MG IV SOLR
6.0000 mg/kg | Freq: Once | INTRAVENOUS | Status: AC
  Administered 2021-05-05: 378 mg via INTRAVENOUS
  Filled 2021-05-05: qty 18

## 2021-05-05 MED ORDER — SODIUM CHLORIDE 0.9 % IV SOLN
510.0000 mg | Freq: Once | INTRAVENOUS | Status: AC
  Administered 2021-05-05: 510 mg via INTRAVENOUS
  Filled 2021-05-05: qty 510

## 2021-05-05 NOTE — Progress Notes (Signed)
Patients port flushed without difficulty.  Good blood return noted with no bruising or swelling noted at site.  Patient to remain accessed for treatment.  

## 2021-05-05 NOTE — Progress Notes (Signed)
OK to proceed with Feraheme with insurance per PA team. ? ?Henreitta Leber, PharmD ?

## 2021-05-05 NOTE — Progress Notes (Signed)
Patient presents today for OGIVRI and feraheme infusions per providers order.  Vital signs within and labs within parameters for treatment.  Patient took premedications prior to clinic appointment.  Patient has no new complaints at this time. ? ?OGIVRI and feraheme infusions given today per MD orders.  Stable during infusions without adverse affects.  Vital signs stable.  No complaints at this time.  Discharge from clinic ambulatory in stable condition.  Alert and oriented X 3.  Follow up with [facility] as scheduled.  ?

## 2021-05-05 NOTE — Patient Instructions (Signed)
[facility]  Discharge Instructions: ?Thank you for choosing [facility] to provide your oncology and hematology care.  ?If you have a lab appointment with the [facility], please come in thru the Main Entrance and check in at the main information desk. ? ?Wear comfortable clothing and clothing appropriate for easy access to any Portacath or PICC line.  ? ?We strive to give you quality time with your provider. You may need to reschedule your appointment if you arrive late (15 or more minutes).  Arriving late affects you and other patients whose appointments are after yours.  Also, if you miss three or more appointments without notifying the office, you may be dismissed from the clinic at the provider?s discretion.    ?  ?For prescription refill requests, have your pharmacy contact our office and allow 72 hours for refills to be completed.   ? ?Today you received the following chemotherapy and/or immunotherapy agents OGIVRI and feraheme     ?  ?To help prevent nausea and vomiting after your treatment, we encourage you to take your nausea medication as directed. ? ?BELOW ARE SYMPTOMS THAT SHOULD BE REPORTED IMMEDIATELY: ?*FEVER GREATER THAN 100.4 F (38 ?C) OR HIGHER ?*CHILLS OR SWEATING ?*NAUSEA AND VOMITING THAT IS NOT CONTROLLED WITH YOUR NAUSEA MEDICATION ?*UNUSUAL SHORTNESS OF BREATH ?*UNUSUAL BRUISING OR BLEEDING ?*URINARY PROBLEMS (pain or burning when urinating, or frequent urination) ?*BOWEL PROBLEMS (unusual diarrhea, constipation, pain near the anus) ?TENDERNESS IN MOUTH AND THROAT WITH OR WITHOUT PRESENCE OF ULCERS (sore throat, sores in mouth, or a toothache) ?UNUSUAL RASH, SWELLING OR PAIN  ?UNUSUAL VAGINAL DISCHARGE OR ITCHING  ? ?Items with * indicate a potential emergency and should be followed up as soon as possible or go to the Emergency Department if any problems should occur. ? ?Please show the CHEMOTHERAPY ALERT CARD or IMMUNOTHERAPY ALERT CARD at check-in to the  Emergency Department and triage nurse. ? ?Should you have questions after your visit or need to cancel or reschedule your appointment, please contact [facility] [phone]  and follow the prompts.  Office hours are 8:00 a.m. to 4:30 p.m. Monday - Friday. Please note that voicemails left after 4:00 p.m. may not be returned until the following business day.  We are closed weekends and major holidays. You have access to a nurse at all times for urgent questions. Please call the main number to the clinic [phone] and follow the prompts. ? ?For any non-urgent questions, you may also contact your provider using MyChart. We now offer e-Visits for anyone 67 and older to request care online for non-urgent symptoms. For details visit [email or web address]. ?  ?Also download the MyChart app! Go to the app store, search "MyChart", open the app, select [facility], and log in with your MyChart username and password. ? ?Due to Covid, a mask is required upon entering the hospital/clinic. If you do not have a mask, one will be given to you upon arrival. For doctor visits, patients may have 1 support person aged 28 or older with them. For treatment visits, patients cannot have anyone with them due to current Covid guidelines and our immunocompromised population.  ?

## 2021-05-21 ENCOUNTER — Encounter (HOSPITAL_COMMUNITY): Payer: Self-pay

## 2021-05-26 ENCOUNTER — Inpatient Hospital Stay (HOSPITAL_BASED_OUTPATIENT_CLINIC_OR_DEPARTMENT_OTHER): Payer: PRIVATE HEALTH INSURANCE | Admitting: Hematology

## 2021-05-26 ENCOUNTER — Inpatient Hospital Stay (HOSPITAL_COMMUNITY): Payer: PRIVATE HEALTH INSURANCE | Attending: Hematology

## 2021-05-26 ENCOUNTER — Inpatient Hospital Stay (HOSPITAL_COMMUNITY): Payer: PRIVATE HEALTH INSURANCE

## 2021-05-26 VITALS — BP 108/71 | HR 87 | Temp 98.1°F | Resp 18

## 2021-05-26 DIAGNOSIS — G629 Polyneuropathy, unspecified: Secondary | ICD-10-CM | POA: Diagnosis not present

## 2021-05-26 DIAGNOSIS — C50411 Malignant neoplasm of upper-outer quadrant of right female breast: Secondary | ICD-10-CM

## 2021-05-26 DIAGNOSIS — Z170 Estrogen receptor positive status [ER+]: Secondary | ICD-10-CM | POA: Diagnosis not present

## 2021-05-26 DIAGNOSIS — Z7901 Long term (current) use of anticoagulants: Secondary | ICD-10-CM | POA: Insufficient documentation

## 2021-05-26 DIAGNOSIS — Z86718 Personal history of other venous thrombosis and embolism: Secondary | ICD-10-CM | POA: Diagnosis not present

## 2021-05-26 DIAGNOSIS — Z79899 Other long term (current) drug therapy: Secondary | ICD-10-CM | POA: Insufficient documentation

## 2021-05-26 DIAGNOSIS — Z5112 Encounter for antineoplastic immunotherapy: Secondary | ICD-10-CM | POA: Insufficient documentation

## 2021-05-26 DIAGNOSIS — Z95828 Presence of other vascular implants and grafts: Secondary | ICD-10-CM

## 2021-05-26 DIAGNOSIS — D649 Anemia, unspecified: Secondary | ICD-10-CM | POA: Diagnosis not present

## 2021-05-26 LAB — CBC WITH DIFFERENTIAL/PLATELET
Abs Immature Granulocytes: 0.03 10*3/uL (ref 0.00–0.07)
Basophils Absolute: 0.1 10*3/uL (ref 0.0–0.1)
Basophils Relative: 1 %
Eosinophils Absolute: 0.1 10*3/uL (ref 0.0–0.5)
Eosinophils Relative: 2 %
HCT: 38.1 % (ref 36.0–46.0)
Hemoglobin: 12.7 g/dL (ref 12.0–15.0)
Immature Granulocytes: 1 %
Lymphocytes Relative: 14 %
Lymphs Abs: 0.9 10*3/uL (ref 0.7–4.0)
MCH: 32.6 pg (ref 26.0–34.0)
MCHC: 33.3 g/dL (ref 30.0–36.0)
MCV: 97.7 fL (ref 80.0–100.0)
Monocytes Absolute: 0.7 10*3/uL (ref 0.1–1.0)
Monocytes Relative: 10 %
Neutro Abs: 4.7 10*3/uL (ref 1.7–7.7)
Neutrophils Relative %: 72 %
Platelets: 323 10*3/uL (ref 150–400)
RBC: 3.9 MIL/uL (ref 3.87–5.11)
RDW: 12.5 % (ref 11.5–15.5)
WBC: 6.4 10*3/uL (ref 4.0–10.5)
nRBC: 0 % (ref 0.0–0.2)

## 2021-05-26 LAB — COMPREHENSIVE METABOLIC PANEL
ALT: 34 U/L (ref 0–44)
AST: 32 U/L (ref 15–41)
Albumin: 3.9 g/dL (ref 3.5–5.0)
Alkaline Phosphatase: 85 U/L (ref 38–126)
Anion gap: 7 (ref 5–15)
BUN: 17 mg/dL (ref 6–20)
CO2: 26 mmol/L (ref 22–32)
Calcium: 9.2 mg/dL (ref 8.9–10.3)
Chloride: 103 mmol/L (ref 98–111)
Creatinine, Ser: 0.78 mg/dL (ref 0.44–1.00)
GFR, Estimated: >60 mL/min (ref >60)
Glucose, Bld: 138 mg/dL — ABNORMAL HIGH (ref 70–99)
Potassium: 3.7 mmol/L (ref 3.5–5.1)
Sodium: 136 mmol/L (ref 135–145)
Total Bilirubin: 0.4 mg/dL (ref 0.3–1.2)
Total Protein: 7.1 g/dL (ref 6.5–8.1)

## 2021-05-26 LAB — MAGNESIUM: Magnesium: 1.8 mg/dL (ref 1.7–2.4)

## 2021-05-26 MED ORDER — SODIUM CHLORIDE 0.9 % IV SOLN: Freq: Once | INTRAVENOUS | Status: AC

## 2021-05-26 MED ORDER — SODIUM CHLORIDE 0.9% FLUSH
10.0000 mL | INTRAVENOUS | Status: DC | PRN
  Administered 2021-05-26: 10 mL

## 2021-05-26 MED ORDER — HEPARIN SOD (PORK) LOCK FLUSH 100 UNIT/ML IV SOLN
500.0000 [IU] | Freq: Once | INTRAVENOUS | Status: AC | PRN
  Administered 2021-05-26: 500 [IU]

## 2021-05-26 MED ORDER — TRASTUZUMAB-DKST CHEMO 150 MG IV SOLR
6.0000 mg/kg | Freq: Once | INTRAVENOUS | Status: AC
  Administered 2021-05-26: 378 mg via INTRAVENOUS
  Filled 2021-05-26: qty 18

## 2021-05-26 NOTE — Progress Notes (Signed)
Patient is taking Anastrozole as prescribed.  She has not missed any doses and reports no side effects other than hot flashes at this time.  ? ?

## 2021-05-26 NOTE — Patient Instructions (Signed)
[facility]  Discharge Instructions: ?Thank you for choosing [facility] to provide your oncology and hematology care.  ?If you have a lab appointment with the [facility], please come in thru the Main Entrance and check in at the main information desk. ? ?Wear comfortable clothing and clothing appropriate for easy access to any Portacath or PICC line.  ? ?We strive to give you quality time with your provider. You may need to reschedule your appointment if you arrive late (15 or more minutes).  Arriving late affects you and other patients whose appointments are after yours.  Also, if you miss three or more appointments without notifying the office, you may be dismissed from the clinic at the provider?s discretion.    ?  ?For prescription refill requests, have your pharmacy contact our office and allow 72 hours for refills to be completed.   ? ?Today you received the following chemotherapy and/or immunotherapy agents Ogivri .     ?  ?To help prevent nausea and vomiting after your treatment, we encourage you to take your nausea medication as directed. ? ?BELOW ARE SYMPTOMS THAT SHOULD BE REPORTED IMMEDIATELY: ?*FEVER GREATER THAN 100.4 F (38 ?C) OR HIGHER ?*CHILLS OR SWEATING ?*NAUSEA AND VOMITING THAT IS NOT CONTROLLED WITH YOUR NAUSEA MEDICATION ?*UNUSUAL SHORTNESS OF BREATH ?*UNUSUAL BRUISING OR BLEEDING ?*URINARY PROBLEMS (pain or burning when urinating, or frequent urination) ?*BOWEL PROBLEMS (unusual diarrhea, constipation, pain near the anus) ?TENDERNESS IN MOUTH AND THROAT WITH OR WITHOUT PRESENCE OF ULCERS (sore throat, sores in mouth, or a toothache) ?UNUSUAL RASH, SWELLING OR PAIN  ?UNUSUAL VAGINAL DISCHARGE OR ITCHING  ? ?Items with * indicate a potential emergency and should be followed up as soon as possible or go to the Emergency Department if any problems should occur. ? ?Please show the CHEMOTHERAPY ALERT CARD or IMMUNOTHERAPY ALERT CARD at check-in to the Emergency  Department and triage nurse. ? ?Should you have questions after your visit or need to cancel or reschedule your appointment, please contact [facility] [phone]  and follow the prompts.  Office hours are 8:00 a.m. to 4:30 p.m. Monday - Friday. Please note that voicemails left after 4:00 p.m. may not be returned until the following business day.  We are closed weekends and major holidays. You have access to a nurse at all times for urgent questions. Please call the main number to the clinic [phone] and follow the prompts. ? ?For any non-urgent questions, you may also contact your provider using MyChart. We now offer e-Visits for anyone 55 and older to request care online for non-urgent symptoms. For details visit [email or web address] ?  ?Also download the MyChart app! Go to the app store, search "MyChart", open the app, select [facility], and log in with your MyChart username and password. ? ?Due to Covid, a mask is required upon entering the hospital/clinic. If you do not have a mask, one will be given to you upon arrival. For doctor visits, patients may have 1 support person aged 62 or older with them. For treatment visits, patients cannot have anyone with them due to current Covid guidelines and our immunocompromised population.  ?

## 2021-05-26 NOTE — Progress Notes (Signed)
Patient presents today for OGIVIR infusion per provider order.  Vital signs and labs reviewed by the MD.   ? ?Message received from Anastasio Champion RN/Dr. Delton Coombes patient okay for treatment. ? ?Patient took premedications at home. ?

## 2021-05-26 NOTE — Progress Notes (Signed)
Treatment given today per MD orders. Tolerated infusion without adverse affects. Vital signs stable. No complaints at this time. Discharged from clinic ambulatory in stable condition. Alert and oriented x 3. F/U with [facility] as scheduled.   

## 2021-05-26 NOTE — Patient Instructions (Addendum)
[place] at [facility] ?Discharge Instructions ? ? ?You were seen and examined today by Dr. Delton Coombes. ? ?He reviewed your lab work which is normal/stable.  ? ?We will proceed with your final treatment today. ? ?We will refer you for removal of your port. When the port is removed you can stop Eliquis. This may help prevent your gums bleeding.  ? ?Return as scheduled.  ? ? ?Thank you for choosing [place] at [facility] to provide your oncology and hematology care.  To afford each patient quality time with our provider, please arrive at least 15 minutes before your scheduled appointment time.  ? ?If you have a lab appointment with the [facility] please come in thru the Main Entrance and check in at the main information desk. ? ?You need to re-schedule your appointment should you arrive 10 or more minutes late.  We strive to give you quality time with our providers, and arriving late affects you and other patients whose appointments are after yours.  Also, if you no show three or more times for appointments you may be dismissed from the clinic at the providers discretion.     ?Again, thank you for choosing [facility].  Our hope is that these requests will decrease the amount of time that you wait before being seen by our physicians.       ?_____________________________________________________________ ? ?Should you have questions after your visit to [facility], please contact our office at [phone] and follow the prompts.  Our office hours are 8:00 a.m. and 4:30 p.m. Monday - Friday.  Please note that voicemails left after 4:00 p.m. may not be returned until the following business day.  We are closed weekends and major holidays.  You do have access to a nurse 24-7, just call the main number to the clinic [phone] and do not press any options, hold on the line and a nurse will answer the phone.   ? ?For prescription refill  requests, have your pharmacy contact our office and allow 72 hours.   ? ?Due to Covid, you will need to wear a mask upon entering the hospital. If you do not have a mask, a mask will be given to you at the Main Entrance upon arrival. For doctor visits, patients may have 1 support person age 42 or older with them. For treatment visits, patients can not have anyone with them due to social distancing guidelines and our immunocompromised population.  ? ?   ?

## 2021-05-26 NOTE — Progress Notes (Signed)
? ?[facility] ?[address] ?[place], [place] [address] ? ? ?CLINIC:  ?Medical Oncology/Hematology ? ?PCP:  ?[name], FNP ?[address] [place] [address] ?[phone] ? ? ?REASON FOR VISIT:  ?Follow-up for right breast cancer ? ?PRIOR THERAPY: none ? ?CURRENT THERAPY: Docetaxel + Carboplatin + Trastuzumab (TCH) q21d / Trastuzumab q21d ? ?BRIEF ONCOLOGIC HISTORY:  ?Oncology History  ?Malignant neoplasm of upper-outer quadrant of right female breast (Lauren Ortega)  ?04/16/2020 Initial Diagnosis  ? Malignant neoplasm of upper-outer quadrant of right female breast (Lauren Ortega) ?  ? Genetic Testing  ? Negative genetic testing. No pathogenic variants identified on the [facility] Multi-Cancer Panel+RNA. The report date is 05/10/2020. ? ?The Multi-Cancer Panel + RNA offered by [facility] includes sequencing and/or deletion duplication testing of the following 84 genes: AIP, ALK, APC, ATM, AXIN2,BAP1,  BARD1, BLM, BMPR1A, BRCA1, BRCA2, BRIP1, CASR, CDC73, CDH1, CDK4, CDKN1B, CDKN1C, CDKN2A (p14ARF), CDKN2A (p16INK4a), CEBPA, CHEK2, CTNNA1, DICER1, DIS3L2, EGFR (c.2369C>T, p.Thr790Met variant only), EPCAM (Deletion/duplication testing only), FH, FLCN, GATA2, GPC3, GREM1 (Promoter region deletion/duplication testing only), HOXB13 (c.251G>A, p.Gly84Glu), HRAS, KIT, MAX, MEN1, MET, MITF (c.952G>A, p.Glu318Lys variant only), MLH1, MSH2, MSH3, MSH6, MUTYH, NBN, NF1, NF2, NTHL1, PALB2, PDGFRA, PHOX2B, PMS2, POLD1, POLE, POT1, PRKAR1A, PTCH1, PTEN, RAD50, RAD51C, RAD51D, RB1, RECQL4, RET, RUNX1, SDHAF2, SDHA (sequence changes only), SDHB, SDHC, SDHD, SMAD4, SMARCA4, SMARCB1, SMARCE1, STK11, SUFU, TERC, TERT, TMEM127, TP53, TSC1, TSC2, VHL, WRN and WT1. ?  ?06/23/2020 -  Chemotherapy  ? Patient is on Treatment Plan : BREAST Docetaxel + Carboplatin + Trastuzumab (TCH) q21d / Trastuzumab q21d  ? ?  ?  ? ? ?CANCER STAGING: ? Cancer Staging  ?Malignant neoplasm of upper-outer quadrant of right female breast (Lauren Ortega) ?Staging form:  Breast, AJCC 8th Edition ?- Clinical stage from 04/21/2020: Stage IA (cT1c, cN0, cM0, G3, ER+, PR-, HER2+) - Unsigned ? ? ?INTERVAL HISTORY:  ?[name], a 41 y.o. female, returns for routine follow-up and consideration for next cycle of chemotherapy. Yasmina was last seen on 03/24/2021. ? ?Due for cycle #17 of Trastuzumab today.  ? ?Overall, she tells me she has been feeling pretty well. She reports her deep leg pains have resolved since starting anastrozole and Cymbalta. The cramps in her hands and feet have decreased since she starting limiting sugar in her diet. She reports hot flashes. She reports increased bleeding from her gums when brushing her teeth, and she otherwise denies bleeding issues. She did not notice a change in energy following iron infusions. She is not taking vitamin D. She reports pain, swelling, and hard "cording" in her right breast starting 4/29.  ? ?Overall, she feels ready for next cycle of chemo today.  ? ? ?REVIEW OF SYSTEMS:  ?Review of Systems  ?Constitutional:  Positive for fatigue. Negative for appetite change.  ?HENT:   Negative for nosebleeds.   ?Respiratory:  Negative for hemoptysis.   ?Cardiovascular:  Positive for chest pain (7/10 R breast and ribs).  ?Gastrointestinal:  Positive for constipation and diarrhea. Negative for blood in stool.  ?Endocrine: Positive for hot flashes.  ?Genitourinary:  Negative for hematuria.   ?Musculoskeletal:  Positive for myalgias (cramping hands and feet - improved).  ?Neurological:  Positive for numbness.  ?Psychiatric/Behavioral:  Positive for sleep disturbance.   ?All other systems reviewed and are negative. ? ?PAST MEDICAL/SURGICAL HISTORY:  ?Past Medical History:  ?Diagnosis Date  ? Anemia   ? Anxiety   ? Breast cancer ([place]) 03/2020  ? Family history of  colon cancer   ? Family history of lymphoma   ? Family history of pancreatic cancer   ? Family history of skin cancer   ? Hypertension   ? Port-A-Cath in place 06/16/2020  ? ?Past  Surgical History:  ?Procedure Laterality Date  ? BREAST LUMPECTOMY WITH RADIOACTIVE SEED AND SENTINEL LYMPH NODE BIOPSY Right 05/20/2020  ? Procedure: RIGHT BREAST LUMPECTOMY WITH RADIOACTIVE SEED AND SENTINEL LYMPH NODE BIOPSY;  Surgeon: Coralie Keens, MD;  Location: [facility];  Service: General;  Laterality: Right;  ? PORTACATH PLACEMENT Left 05/20/2020  ? Procedure: INSERTION PORT-A-CATH;  Surgeon: Coralie Keens, MD;  Location: [facility];  Service: General;  Laterality: Left;  ? TONSILLECTOMY    ? TUBAL LIGATION    ? ? ?SOCIAL HISTORY:  ?Social History  ? ?Socioeconomic History  ? Marital status: Divorced  ?  Spouse name: Not on file  ? Number of children: Not on file  ? Years of education: Not on file  ? Highest education level: Not on file  ?Occupational History  ? Not on file  ?Tobacco Use  ? Smoking status: Never  ? Smokeless tobacco: Never  ?Substance and Sexual Activity  ? Alcohol use: Yes  ?  Comment: occassionally  ? Drug use: Never  ? Sexual activity: Yes  ?  Birth control/protection: Surgical  ?Other Topics Concern  ? Not on file  ?Social History Narrative  ? Not on file  ? ?Social Determinants of Health  ? ?Financial Resource Strain: Not on file  ?Food Insecurity: Not on file  ?Transportation Needs: Not on file  ?Physical Activity: Not on file  ?Stress: Not on file  ?Social Connections: Not on file  ?Intimate Partner Violence: Not on file  ? ? ?FAMILY HISTORY:  ?Family History  ?Problem Relation Age of Onset  ? Basal cell carcinoma Mother 11  ? Bladder Cancer Maternal Uncle 72  ? Lymphoma Maternal Uncle 19  ?     [place]s   ? Pancreatic cancer Maternal Grandmother   ? Non-Hodgkin's lymphoma Maternal Grandfather   ? Lung cancer Paternal Grandfather   ? Colon cancer Other   ?     x8 or 9  ? Colon cancer Maternal Great-grandfather   ? ? ?CURRENT MEDICATIONS:  ?Current Outpatient Medications  ?Medication Sig Dispense Refill  ? amLODipine (NORVASC) 10 MG tablet Take  10 mg by mouth daily.    ? anastrozole (ARIMIDEX) 1 MG tablet Take 1 tablet (1 mg total) by mouth daily. 30 tablet 6  ? baclofen (LIORESAL) 20 MG tablet Take 20 mg by mouth 3 (three) times daily as needed.    ? diphenoxylate-atropine (LOMOTIL) 2.5-0.025 MG tablet Take 2 tablets by mouth 4 (four) times daily as needed for diarrhea or loose stools. 30 tablet 3  ? DULoxetine (CYMBALTA) 30 MG capsule Take 1 capsule (30 mg total) by mouth 2 (two) times daily. 180 capsule 1  ? ELIQUIS 5 MG TABS tablet TAKE 1 TABLET BY MOUTH TWICE A DAY 60 tablet 3  ? FERREX 150 150 MG capsule Take 1 capsule (150 mg total) by mouth daily. 90 capsule 3  ? folic acid (FOLVITE) 1 MG tablet Take by mouth.    ? magnesium oxide (MAG-OX) 400 (240 Mg) MG tablet TAKE 1 TABLET BY MOUTH IN THE MORNING AND IN THE EVENING 60 tablet 5  ? meloxicam (MOBIC) 15 MG tablet Take 15 mg by mouth daily as needed.    ? oxyCODONE (OXY IR/ROXICODONE) 5 MG  immediate release tablet Take 1 tablet (5 mg total) by mouth every 6 (six) hours as needed for moderate pain or severe pain. 25 tablet 0  ? spironolactone (ALDACTONE) 50 MG tablet Take 50 mg by mouth every morning.    ? temazepam (RESTORIL) 15 MG capsule Take 1 capsule (15 mg total) by mouth at bedtime as needed for sleep. 30 capsule 2  ? trastuzumab in sodium chloride 0.9 % 250 mL Inject into the vein every 21 ( twenty-one) days.    ? vitamin B-12 (CYANOCOBALAMIN) 1000 MCG tablet Take 1 tablet (1,000 mcg total) by mouth daily. 90 tablet 3  ? lidocaine-prilocaine (EMLA) cream Apply a small amount to port a cath site and cover with plastic wrap 1 hour prior to infusion appointments (Patient not taking: Reported on 05/26/2021) 30 g 3  ? prochlorperazine (COMPAZINE) 10 MG tablet Take 1 tablet (10 mg total) by mouth every 6 (six) hours as needed (Nausea or vomiting). (Patient not taking: Reported on 05/26/2021) 30 tablet 1  ? ?No current facility-administered medications for this visit.  ? ? ?ALLERGIES:  ?No Known  Allergies ? ?PHYSICAL EXAM:  ?Performance status (ECOG): 0 - Asymptomatic ? ?There were no vitals filed for this visit. ?Wt Readings from Last 3 Encounters:  ?05/26/21 139 lb 6.4 oz (63.2 kg)  ?05/05/21 137 lb 6.4 oz (6

## 2021-06-04 ENCOUNTER — Encounter (HOSPITAL_COMMUNITY): Payer: Self-pay

## 2021-06-07 ENCOUNTER — Encounter (HOSPITAL_COMMUNITY): Payer: Self-pay | Admitting: *Deleted

## 2021-06-07 NOTE — Progress Notes (Signed)
PT referral sent to [facility] in [place] per patient's request for evaluate and treat.  Faxed to [phone].  Phone [phone]. ?

## 2021-06-14 ENCOUNTER — Ambulatory Visit: Payer: PRIVATE HEALTH INSURANCE | Attending: Surgery

## 2021-06-14 VITALS — Wt 142.0 lb

## 2021-06-14 DIAGNOSIS — Z483 Aftercare following surgery for neoplasm: Secondary | ICD-10-CM | POA: Insufficient documentation

## 2021-06-14 NOTE — Therapy (Signed)
OUTPATIENT PHYSICAL THERAPY SOZO SCREENING NOTE   Patient Name: Lauren Ortega MRN: [id number] DOB:05/11/1980, 41 y.o., female Today's Date: 06/14/2021  PCP: [name], FNP REFERRING PROVIDER: [name], FNP   PT End of Session - 06/14/21 1533     Visit Number 7   # unchanged due to screen only   PT Start Time 1531    PT Stop Time 1540    PT Time Calculation (min) 9 min    Activity Tolerance Patient tolerated treatment well    Behavior During Therapy [facility] for tasks assessed/performed             Past Medical History:  Diagnosis Date   Anemia    Anxiety    Breast cancer ([place]) 03/2020   Family history of colon cancer    Family history of lymphoma    Family history of pancreatic cancer    Family history of skin cancer    Hypertension    Port-A-Cath in place 06/16/2020   Past Surgical History:  Procedure Laterality Date   BREAST LUMPECTOMY WITH RADIOACTIVE SEED AND SENTINEL LYMPH NODE BIOPSY Right 05/20/2020   Procedure: RIGHT BREAST LUMPECTOMY WITH RADIOACTIVE SEED AND SENTINEL LYMPH NODE BIOPSY;  Surgeon: Coralie Keens, MD;  Location: [facility];  Service: General;  Laterality: Right;   PORTACATH PLACEMENT Left 05/20/2020   Procedure: INSERTION PORT-A-CATH;  Surgeon: Coralie Keens, MD;  Location: [facility];  Service: General;  Laterality: Left;   TONSILLECTOMY     TUBAL LIGATION     Patient Active Problem List   Diagnosis Date Noted   Iron deficiency anemia 05/04/2021   Port-A-Cath in place 06/16/2020   Genetic testing 05/12/2020   Family history of pancreatic cancer    Family history of skin cancer    Family history of lymphoma    Family history of colon cancer    Malignant neoplasm of upper-outer quadrant of right female breast ([place]) 04/16/2020    REFERRING DIAG: right breast cancer at risk for lymphedema  THERAPY DIAG: Aftercare following surgery for neoplasm  PERTINENT HISTORY: Stage 1 IDC Rt breast  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 Ninfa Linden, HTN, DDD lumbar spine, Developed multiple bloodclots in LUE due to portacath which are now resolved.  Swelling from bloodclots has not resolved.   PRECAUTIONS: right UE Lymphedema risk, None  SUBJECTIVE: Pt returns for her 3 month L-Dex screen. " I think I have Mondor's I've started noticing a lot of cording along my Rt lateral trunk and the doctor felt it was lymphedema."  PAIN:  Are you having pain? No  SOZO SCREENING: Patient was assessed today using the SOZO machine to determine the lymphedema index score. This was compared to her baseline score. It was determined that she is within the recommended range when compared to her baseline and no further action is needed at this time. She will continue SOZO screenings. These are done every 3 months for 2 years post operatively followed by every 6 months for 2 years, and then annually.  Also briefly assessed pts Rt breast/trunk per her request. She has mod-max edema present at lateral trunk that is near to feeling fibrotic. Some skin change of peau de l'orange also noted at medial aspect of breast but in general breast feels very soft compared to lateral trunk. Pt has been instructed in MLD from a previous session so briefly reviewed sequencing and light pressure/skin stretch and issued breast  MLD handout as well. She is to begin treatment in [place] for the cording and begin self MLD immediately. If symptoms don't resolve she will return to our clinic for treatment. She was also issued information to get measured for a compression bra at [facility] [place].    Otelia Limes, PTA 06/14/2021, 3:42 PM    Perform this sequence once a day.  Only give enough pressure to your skin to make the skin move.  Diaphragmatic - Supine   Inhale through nose making navel move out toward hands. Exhale through puckered lips, hands follow navel  in. Repeat _5__ times. Rest _10__ seconds between repeats.   Copyright  VHI. All rights reserved.  Hug yourself.  Do circles at your neck just above your collarbones.  Repeat this 10 times.  Axilla - One at a Time   Using full weight of flat hand and fingers at center of uninvolved armpit, make _10__ in-place circles.   Copyright  VHI. All rights reserved.  LEG: Inguinal Nodes Stimulation   With small finger side of hand against hip crease on involved side, gently perform circles at the crease. Repeat __10_ times.   Copyright  VHI. All rights reserved.  Axilla to Inguinal Nodes - Sweep   On involved side, sweep _4__ times from armpit along side of trunk to hip crease.  Now gently stretch skin from the involved side to the uninvolved side across the chest at the shoulder line.  Repeat that 4 times.  Draw an imaginary diagonal line from upper outer breast through the nipple area toward lower inner breast.  Direct fluid upward and inward from this line toward the pathway across your upper chest .  Do this in three rows to treat all of the upper inner breast tissue, and do each row 3-4x.      Direct fluid to treat all of lower outer breast tissue downward and outward toward pathway that is aimed at the right groin.  Finish by doing the pathways as described above going from your involved armpit to the same side groin and going across your upper chest from the involved shoulder to the uninvolved shoulder.  Repeat the steps above where you do circles in your right groin and left armpit.

## 2021-06-14 NOTE — Patient Instructions (Signed)
Self manual lymph drainage: Perform this sequence once a day.  Only give enough pressure to your skin to make the skin move.  Diaphragmatic - Supine   Inhale through nose making navel move out toward hands. Exhale through puckered lips, hands follow navel in. Repeat _5__ times. Rest _10__ seconds between repeats.   Copyright  VHI. All rights reserved.  Hug yourself.  Do circles at your neck just above your collarbones.  Repeat this 10 times.  Axilla - One at a Time   Using full weight of flat hand and fingers at center of uninvolved armpit, make _10__ in-place circles.   Copyright  VHI. All rights reserved.  LEG: Inguinal Nodes Stimulation   With small finger side of hand against hip crease on involved side, gently perform circles at the crease. Repeat __10_ times.   Copyright  VHI. All rights reserved.  Axilla to Inguinal Nodes - Sweep   On involved side, sweep _4__ times from armpit along side of trunk to hip crease.  Now gently stretch skin from the involved side to the uninvolved side across the chest at the shoulder line.  Repeat that 4 times.  Draw an imaginary diagonal line from upper outer breast through the nipple area toward lower inner breast.  Direct fluid upward and inward from this line toward the pathway across your upper chest .  Do this in three rows to treat all of the upper inner breast tissue, and do each row 3-4x.      Direct fluid to treat all of lower outer breast tissue downward and outward toward pathway that is aimed at the right groin.  Finish by doing the pathways as described above going from your involved armpit to the same side groin and going across your upper chest from the involved shoulder to the uninvolved shoulder.  Repeat the steps above where you do circles in your right groin and left armpit. Copyright  VHI. All rights reserved.

## 2021-06-16 ENCOUNTER — Encounter (HOSPITAL_COMMUNITY): Payer: Self-pay

## 2021-06-16 ENCOUNTER — Other Ambulatory Visit (HOSPITAL_COMMUNITY): Payer: Self-pay | Admitting: *Deleted

## 2021-06-16 DIAGNOSIS — C50411 Malignant neoplasm of upper-outer quadrant of right female breast: Secondary | ICD-10-CM

## 2021-06-17 ENCOUNTER — Ambulatory Visit (HOSPITAL_COMMUNITY)
Admission: RE | Admit: 2021-06-17 | Discharge: 2021-06-17 | Disposition: A | Discharge status: Home / Self Care | Payer: PRIVATE HEALTH INSURANCE | Source: Ambulatory Visit | Attending: Hematology | Admitting: Hematology

## 2021-06-17 DIAGNOSIS — C50411 Malignant neoplasm of upper-outer quadrant of right female breast: Secondary | ICD-10-CM

## 2021-06-17 DIAGNOSIS — Z79899 Other long term (current) drug therapy: Secondary | ICD-10-CM | POA: Diagnosis not present

## 2021-06-17 DIAGNOSIS — Z170 Estrogen receptor positive status [ER+]: Secondary | ICD-10-CM | POA: Insufficient documentation

## 2021-06-17 DIAGNOSIS — L03313 Cellulitis of chest wall: Secondary | ICD-10-CM | POA: Diagnosis not present

## 2021-06-17 DIAGNOSIS — Z7901 Long term (current) use of anticoagulants: Secondary | ICD-10-CM | POA: Diagnosis not present

## 2021-06-18 ENCOUNTER — Other Ambulatory Visit (HOSPITAL_COMMUNITY): Payer: Self-pay

## 2021-06-18 ENCOUNTER — Encounter (HOSPITAL_COMMUNITY): Payer: Self-pay

## 2021-06-18 DIAGNOSIS — Z170 Estrogen receptor positive status [ER+]: Secondary | ICD-10-CM

## 2021-06-18 MED ORDER — CEPHALEXIN 500 MG PO CAPS: 500.0000 mg | ORAL_CAPSULE | Freq: Three times a day (TID) | ORAL | 0 refills | Status: DC

## 2021-06-18 NOTE — Telephone Encounter (Signed)
Antibiotic order and IR port removal order placed per Dr. Delton Coombes verbal orders. See MyChart message encounter from the same date for reference.

## 2021-06-19 ENCOUNTER — Inpatient Hospital Stay (HOSPITAL_COMMUNITY): Payer: PRIVATE HEALTH INSURANCE | Admitting: Anesthesiology

## 2021-06-19 ENCOUNTER — Inpatient Hospital Stay (HOSPITAL_COMMUNITY): Payer: PRIVATE HEALTH INSURANCE

## 2021-06-19 ENCOUNTER — Emergency Department (HOSPITAL_COMMUNITY): Payer: PRIVATE HEALTH INSURANCE

## 2021-06-19 ENCOUNTER — Other Ambulatory Visit: Payer: Self-pay

## 2021-06-19 ENCOUNTER — Observation Stay (HOSPITAL_COMMUNITY)
Admission: EM | Admit: 2021-06-19 | Discharge: 2021-06-19 | Disposition: A | Discharge status: Home / Self Care | Payer: PRIVATE HEALTH INSURANCE | Attending: Internal Medicine | Admitting: Internal Medicine

## 2021-06-19 ENCOUNTER — Encounter (HOSPITAL_COMMUNITY)
Admission: EM | Disposition: A | Discharge status: Home / Self Care | Payer: Self-pay | Source: Home / Self Care | Attending: Emergency Medicine

## 2021-06-19 ENCOUNTER — Encounter (HOSPITAL_COMMUNITY): Payer: Self-pay

## 2021-06-19 DIAGNOSIS — I82A22 Chronic embolism and thrombosis of left axillary vein: Secondary | ICD-10-CM | POA: Diagnosis not present

## 2021-06-19 DIAGNOSIS — F419 Anxiety disorder, unspecified: Secondary | ICD-10-CM

## 2021-06-19 DIAGNOSIS — Z7901 Long term (current) use of anticoagulants: Secondary | ICD-10-CM | POA: Insufficient documentation

## 2021-06-19 DIAGNOSIS — Z170 Estrogen receptor positive status [ER+]: Secondary | ICD-10-CM

## 2021-06-19 DIAGNOSIS — Z95828 Presence of other vascular implants and grafts: Secondary | ICD-10-CM

## 2021-06-19 DIAGNOSIS — I10 Essential (primary) hypertension: Secondary | ICD-10-CM

## 2021-06-19 DIAGNOSIS — Z79899 Other long term (current) drug therapy: Secondary | ICD-10-CM | POA: Insufficient documentation

## 2021-06-19 DIAGNOSIS — C50411 Malignant neoplasm of upper-outer quadrant of right female breast: Secondary | ICD-10-CM

## 2021-06-19 DIAGNOSIS — D509 Iron deficiency anemia, unspecified: Secondary | ICD-10-CM

## 2021-06-19 DIAGNOSIS — L03313 Cellulitis of chest wall: Secondary | ICD-10-CM | POA: Diagnosis present

## 2021-06-19 DIAGNOSIS — L0390 Cellulitis, unspecified: Secondary | ICD-10-CM | POA: Diagnosis present

## 2021-06-19 DIAGNOSIS — F32A Depression, unspecified: Secondary | ICD-10-CM

## 2021-06-19 HISTORY — DX: Adverse effect of antineoplastic and immunosuppressive drugs, initial encounter: G62.0

## 2021-06-19 HISTORY — PX: PORT-A-CATH REMOVAL: SHX5289

## 2021-06-19 HISTORY — DX: Cardiac murmur, unspecified: R01.1

## 2021-06-19 HISTORY — DX: Other intervertebral disc degeneration, lumbar region: M51.36

## 2021-06-19 HISTORY — DX: Other intervertebral disc degeneration, lumbar region without mention of lumbar back pain or lower extremity pain: M51.369

## 2021-06-19 HISTORY — DX: Drug-induced polyneuropathy: T45.1X5A

## 2021-06-19 LAB — TROPONIN I (HIGH SENSITIVITY)
Troponin I (High Sensitivity): 3 ng/L (ref <18)
Troponin I (High Sensitivity): 3 ng/L (ref <18)

## 2021-06-19 LAB — COMPREHENSIVE METABOLIC PANEL
ALT: 30 U/L (ref 0–44)
AST: 28 U/L (ref 15–41)
Albumin: 4.1 g/dL (ref 3.5–5.0)
Alkaline Phosphatase: 106 U/L (ref 38–126)
Anion gap: 6 (ref 5–15)
BUN: 16 mg/dL (ref 6–20)
CO2: 28 mmol/L (ref 22–32)
Calcium: 9.3 mg/dL (ref 8.9–10.3)
Chloride: 102 mmol/L (ref 98–111)
Creatinine, Ser: 0.8 mg/dL (ref 0.44–1.00)
GFR, Estimated: >60 mL/min (ref >60)
Glucose, Bld: 104 mg/dL — ABNORMAL HIGH (ref 70–99)
Potassium: 3.8 mmol/L (ref 3.5–5.1)
Sodium: 136 mmol/L (ref 135–145)
Total Bilirubin: 0.2 mg/dL — ABNORMAL LOW (ref 0.3–1.2)
Total Protein: 7.2 g/dL (ref 6.5–8.1)

## 2021-06-19 LAB — CBC WITH DIFFERENTIAL/PLATELET
Abs Immature Granulocytes: 0.06 10*3/uL (ref 0.00–0.07)
Basophils Absolute: 0 10*3/uL (ref 0.0–0.1)
Basophils Relative: 0 %
Eosinophils Absolute: 0.2 10*3/uL (ref 0.0–0.5)
Eosinophils Relative: 2 %
HCT: 39.5 % (ref 36.0–46.0)
Hemoglobin: 13.4 g/dL (ref 12.0–15.0)
Immature Granulocytes: 1 %
Lymphocytes Relative: 8 %
Lymphs Abs: 0.8 10*3/uL (ref 0.7–4.0)
MCH: 33.4 pg (ref 26.0–34.0)
MCHC: 33.9 g/dL (ref 30.0–36.0)
MCV: 98.5 fL (ref 80.0–100.0)
Monocytes Absolute: 1.2 10*3/uL — ABNORMAL HIGH (ref 0.1–1.0)
Monocytes Relative: 11 %
Neutro Abs: 8.3 10*3/uL — ABNORMAL HIGH (ref 1.7–7.7)
Neutrophils Relative %: 78 %
Platelets: 320 10*3/uL (ref 150–400)
RBC: 4.01 MIL/uL (ref 3.87–5.11)
RDW: 12.8 % (ref 11.5–15.5)
WBC: 10.6 10*3/uL — ABNORMAL HIGH (ref 4.0–10.5)
nRBC: 0 % (ref 0.0–0.2)

## 2021-06-19 LAB — URINALYSIS, ROUTINE W REFLEX MICROSCOPIC
Bilirubin Urine: NEGATIVE
Glucose, UA: NEGATIVE mg/dL
Hgb urine dipstick: NEGATIVE
Ketones, ur: NEGATIVE mg/dL
Leukocytes,Ua: NEGATIVE
Nitrite: NEGATIVE
Protein, ur: NEGATIVE mg/dL
Specific Gravity, Urine: 1.013 (ref 1.005–1.030)
pH: 8 (ref 5.0–8.0)

## 2021-06-19 LAB — PREGNANCY, URINE: Preg Test, Ur: NEGATIVE

## 2021-06-19 LAB — PROTIME-INR
INR: 0.9 (ref 0.8–1.2)
Prothrombin Time: 12.4 seconds (ref 11.4–15.2)

## 2021-06-19 LAB — LACTIC ACID, PLASMA
Lactic Acid, Venous: 0.8 mmol/L (ref 0.5–1.9)
Lactic Acid, Venous: 1.2 mmol/L (ref 0.5–1.9)

## 2021-06-19 LAB — APTT: aPTT: 34 seconds (ref 24–36)

## 2021-06-19 SURGERY — REMOVAL PORT-A-CATH
Anesthesia: Monitor Anesthesia Care | Site: Chest | Laterality: Left

## 2021-06-19 MED ORDER — LACTATED RINGERS IV SOLN: INTRAVENOUS | Status: DC

## 2021-06-19 MED ORDER — ACETAMINOPHEN 500 MG PO TABS: 1000.0000 mg | ORAL_TABLET | Freq: Four times a day (QID) | ORAL | 3 refills | Status: AC

## 2021-06-19 MED ORDER — ONDANSETRON HCL 4 MG/2ML IJ SOLN: 4.0000 mg | Freq: Once | INTRAMUSCULAR | Status: DC | PRN

## 2021-06-19 MED ORDER — IBUPROFEN 600 MG PO TABS: 600.0000 mg | ORAL_TABLET | Freq: Four times a day (QID) | ORAL | 1 refills | Status: DC

## 2021-06-19 MED ORDER — CHLORHEXIDINE GLUCONATE 0.12 % MT SOLN: 15.0000 mL | Freq: Once | OROMUCOSAL | Status: AC

## 2021-06-19 MED ORDER — CHLORHEXIDINE GLUCONATE 0.12 % MT SOLN
OROMUCOSAL | Status: AC
  Administered 2021-06-19: 15 mL via OROMUCOSAL
  Filled 2021-06-19: qty 15

## 2021-06-19 MED ORDER — LIDOCAINE HCL URETHRAL/MUCOSAL 2 % EX GEL: 1.0000 "application " | Freq: Once | CUTANEOUS | Status: DC

## 2021-06-19 MED ORDER — BUPIVACAINE-EPINEPHRINE (PF) 0.25% -1:200000 IJ SOLN
INTRAMUSCULAR | Status: DC | PRN
  Administered 2021-06-19: 23 mL via PERINEURAL

## 2021-06-19 MED ORDER — OXYCODONE HCL 5 MG PO TABS
5.0000 mg | ORAL_TABLET | ORAL | 0 refills | Status: DC | PRN
Start: 2021-06-19 — End: 2022-04-27

## 2021-06-19 MED ORDER — METHOCARBAMOL 750 MG PO TABS: 750.0000 mg | ORAL_TABLET | Freq: Four times a day (QID) | ORAL | 1 refills | Status: DC

## 2021-06-19 MED ORDER — BUPIVACAINE HCL (PF) 0.25 % IJ SOLN
INTRAMUSCULAR | Status: AC
  Filled 2021-06-19: qty 10

## 2021-06-19 MED ORDER — FENTANYL CITRATE (PF) 100 MCG/2ML IJ SOLN: 25.0000 ug | INTRAMUSCULAR | Status: DC | PRN

## 2021-06-19 MED ORDER — VANCOMYCIN HCL IN DEXTROSE 1-5 GM/200ML-% IV SOLN
1000.0000 mg | Freq: Once | INTRAVENOUS | Status: AC
  Administered 2021-06-19: 1000 mg via INTRAVENOUS
  Filled 2021-06-19: qty 200

## 2021-06-19 MED ORDER — LIDOCAINE-PRILOCAINE 2.5-2.5 % EX CREA
TOPICAL_CREAM | Freq: Once | CUTANEOUS | Status: AC
  Filled 2021-06-19: qty 5

## 2021-06-19 MED ORDER — IOHEXOL 300 MG/ML  SOLN
75.0000 mL | Freq: Once | INTRAMUSCULAR | Status: AC | PRN
  Administered 2021-06-19: 75 mL via INTRAVENOUS

## 2021-06-19 MED ORDER — FENTANYL CITRATE (PF) 250 MCG/5ML IJ SOLN
INTRAMUSCULAR | Status: AC
  Filled 2021-06-19: qty 5

## 2021-06-19 MED ORDER — MIDAZOLAM HCL 2 MG/2ML IJ SOLN
INTRAMUSCULAR | Status: AC
  Filled 2021-06-19: qty 2

## 2021-06-19 MED ORDER — OXYCODONE HCL 5 MG PO TABS: 5.0000 mg | ORAL_TABLET | Freq: Once | ORAL | Status: DC | PRN

## 2021-06-19 MED ORDER — OXYCODONE HCL 5 MG/5ML PO SOLN: 5.0000 mg | Freq: Once | ORAL | Status: DC | PRN

## 2021-06-19 MED ORDER — ORAL CARE MOUTH RINSE: 15.0000 mL | Freq: Once | OROMUCOSAL | Status: AC

## 2021-06-19 MED ORDER — DOCUSATE SODIUM 100 MG PO CAPS: 100.0000 mg | ORAL_CAPSULE | Freq: Two times a day (BID) | ORAL | 2 refills | Status: AC

## 2021-06-19 SURGICAL SUPPLY — 33 items
APPLICATOR CHLORAPREP 3ML ORNG (MISCELLANEOUS) ×1 IMPLANT
BAG COUNTER SPONGE SURGICOUNT (BAG) ×1 IMPLANT
BLADE SURG 10 STRL SS (BLADE) ×1 IMPLANT
COVER SURGICAL LIGHT HANDLE (MISCELLANEOUS) ×1 IMPLANT
DRAPE LAPAROTOMY 100X72 PEDS (DRAPES) ×1 IMPLANT
DRSG TEGADERM 4X10 (GAUZE/BANDAGES/DRESSINGS) ×1 IMPLANT
ELECT REM PT RETURN 9FT ADLT (ELECTROSURGICAL)
ELECTRODE REM PT RTRN 9FT ADLT (ELECTROSURGICAL) ×1 IMPLANT
GAUZE 4X4 16PLY ~~LOC~~+RFID DBL (SPONGE) ×1 IMPLANT
GAUZE SPONGE 4X4 12PLY STRL (GAUZE/BANDAGES/DRESSINGS) ×2 IMPLANT
GLOVE BIO SURGEON STRL SZ7.5 (GLOVE) ×2 IMPLANT
GLOVE BIOGEL PI IND STRL 8 (GLOVE) ×1 IMPLANT
GLOVE BIOGEL PI INDICATOR 8 (GLOVE)
GLOVE SURG SYN 5.5 (GLOVE) ×4 IMPLANT
GLOVE SURG SYN 5.5 PF PI (GLOVE) ×1 IMPLANT
GLOVE SURG UNDER POLY LF SZ6 (GLOVE) ×1 IMPLANT
GOWN STRL REUS W/ TWL LRG LVL3 (GOWN DISPOSABLE) ×1 IMPLANT
GOWN STRL REUS W/TWL LRG LVL3 (GOWN DISPOSABLE) ×1
KIT BASIN OR (CUSTOM PROCEDURE TRAY) ×1 IMPLANT
KIT TURNOVER KIT B (KITS) ×1 IMPLANT
NDL HYPO 25GX1X1/2 BEV (NEEDLE) ×1 IMPLANT
NEEDLE HYPO 25GX1X1/2 BEV (NEEDLE) ×2 IMPLANT
NS IRRIG 1000ML POUR BTL (IV SOLUTION) ×2 IMPLANT
PACK GENERAL/GYN (CUSTOM PROCEDURE TRAY) ×1 IMPLANT
PAD ARMBOARD 7.5X6 YLW CONV (MISCELLANEOUS) ×2 IMPLANT
PENCIL SMOKE EVACUATOR (MISCELLANEOUS) ×1 IMPLANT
SUT MON AB 4-0 PC3 18 (SUTURE) ×1 IMPLANT
SUT VIC AB 2-0 SH 18 (SUTURE) ×1 IMPLANT
SUT VIC AB 3-0 SH 27 (SUTURE)
SUT VIC AB 3-0 SH 27X BRD (SUTURE) ×1 IMPLANT
SYR CONTROL 10ML LL (SYRINGE) ×2 IMPLANT
TOWEL GREEN STERILE (TOWEL DISPOSABLE) ×1 IMPLANT
TOWEL GREEN STERILE FF (TOWEL DISPOSABLE) ×2 IMPLANT

## 2021-06-19 NOTE — ED Triage Notes (Signed)
Pt presents to Ed with c/o possible infection to implanted port (left chest). Pt spoke with her Dr yesterday, started anbx that was called in and instructed to come to ED if redness spread or got worse.

## 2021-06-19 NOTE — Assessment & Plan Note (Signed)
-  stable overall -continue heart healthy diet -resume home antihypertensive agents (norvasc and spironolactone).

## 2021-06-19 NOTE — Discharge Instructions (Addendum)
May shower beginning 06/20/2021. May remove dressing 06/21/2021. Okay to replace a dressing with gauze and tape if continued drainage present. After dressing removal, may allow warm soapy water to run over incision, then rinse and pat dry. Do not soak in any water (tubs, hot tubs, pools, lakes, oceans) for two weeks.   No lifting your arms about shoulder height for one week. May resume sexual activity when it is comfortable.   Pain regimen: take over-the-counter tylenol (acetaminophen) '1000mg'$  every six hours, the prescription ibuprofen ('600mg'$ ) every six hours and the robaxin (methocarbamol) '750mg'$  every six hours. With all three of these, you should be taking something every two hours. Example: tylenol ( acetaminophen) at 8am, ibuprofen at 10am, robaxin (methocarbamol) at 12pm, tylenol (acetaminophen) again at 2pm, ibuprofen again at 4pm, robaxin (methocarbamol) at 6pm. You also have a prescription for oxycodone, which should be taken if the tylenol (acetaminophen), ibuprofen, and robaxin (methocarbamol) are not enough to control your pain. You may take the oxycodone as frequently as every four hours as needed, but if you are taking the other medications as above, you should not need the oxycodone this frequently. You have also been given a prescription for colace (docusate) which is a stool softener. Please take this as prescribed because the oxycodone can cause constipation and the colace (docusate) will minimize or prevent constipation. Do not drive while taking or under the influence of the oxycodone as it is a narcotic medication.  Continue taking your previously prescribed antibiotic.  Call the office at [phone] for temperature greater than 101.38F, worsening pain, redness or warmth at the incision site.  Please call [phone] to make an appointment for this week for wound check.

## 2021-06-19 NOTE — Anesthesia Preprocedure Evaluation (Signed)
Anesthesia Evaluation  Patient identified by MRN, date of birth, ID band Patient awake    Reviewed: Allergy & Precautions, NPO status , Patient's Chart, lab work & pertinent test results  Airway Mallampati: II  TM Distance: >3 FB Neck ROM: Full    Dental no notable dental hx.    Pulmonary neg pulmonary ROS,    Pulmonary exam normal breath sounds clear to auscultation       Cardiovascular hypertension, Pt. on medications Normal cardiovascular exam Rhythm:Regular Rate:Normal     Neuro/Psych negative neurological ROS  negative psych ROS   GI/Hepatic negative GI ROS, Neg liver ROS,   Endo/Other  negative endocrine ROS  Renal/GU negative Renal ROS  negative genitourinary   Musculoskeletal negative musculoskeletal ROS (+)   Abdominal   Peds negative pediatric ROS (+)  Hematology negative hematology ROS (+)   Anesthesia Other Findings   Reproductive/Obstetrics negative OB ROS                             Anesthesia Physical Anesthesia Plan  ASA: 2  Anesthesia Plan: MAC   Post-op Pain Management: Minimal or no pain anticipated   Induction: Intravenous  PONV Risk Score and Plan: 2 and Propofol infusion, Midazolam, Ondansetron and Treatment may vary due to age or medical condition  Airway Management Planned: Simple Face Mask  Additional Equipment:   Intra-op Plan:   Post-operative Plan:   Informed Consent: I have reviewed the patients History and Physical, chart, labs and discussed the procedure including the risks, benefits and alternatives for the proposed anesthesia with the patient or authorized representative who has indicated his/her understanding and acceptance.     Dental advisory given  Plan Discussed with: CRNA and Surgeon  Anesthesia Plan Comments:         Anesthesia Quick Evaluation

## 2021-06-19 NOTE — H&P (Signed)
Reason for Consult/Chief Complaint:infection at port site Consultant: Dyann Kief, MD  Lauren Ortega is an 41 y.o. female.   HPI: 64F with a two day history of cellulitis of the port site. Called her oncologist and was given keflex, but erythema continued to worsen causing her to present to the ED. Denies n/v.   Past Medical History:  Diagnosis Date   Anemia    Anxiety    Breast cancer ([place]) 03/2020   Family history of colon cancer    Family history of lymphoma    Family history of pancreatic cancer    Family history of skin cancer    Heart murmur    Hypertension    Lumbar degenerative disc disease    Neuropathy due to chemotherapeutic drug ([place])    Port-A-Cath in place 06/16/2020    Past Surgical History:  Procedure Laterality Date   BREAST LUMPECTOMY WITH RADIOACTIVE SEED AND SENTINEL LYMPH NODE BIOPSY Right 05/20/2020   Procedure: RIGHT BREAST LUMPECTOMY WITH RADIOACTIVE SEED AND SENTINEL LYMPH NODE BIOPSY;  Surgeon: Coralie Keens, MD;  Location: [facility];  Service: General;  Laterality: Right;   CESAREAN SECTION     x3   PORTACATH PLACEMENT Left 05/20/2020   Procedure: INSERTION PORT-A-CATH;  Surgeon: Coralie Keens, MD;  Location: [facility];  Service: General;  Laterality: Left;   TONSILLECTOMY     TUBAL LIGATION      Family History  Problem Relation Age of Onset   Basal cell carcinoma Mother 47   Bladder Cancer Maternal Uncle 72   Lymphoma Maternal Uncle 58       Waldenstroms    Pancreatic cancer Maternal Grandmother    Non-Hodgkin's lymphoma Maternal Grandfather    Lung cancer Paternal Grandfather    Colon cancer Other        x8 or 9   Colon cancer Maternal Great-grandfather     Social History:  reports that she has never smoked. She has never used smokeless tobacco. She reports current alcohol use. She reports that she does not use drugs.  Allergies: No Known Allergies  Medications: I have reviewed the patient's  current medications.  Results for orders placed or performed during the hospital encounter of 06/19/21 (from the past 48 hour(s))  Urinalysis, Routine w reflex microscopic Urine, Clean Catch     Status: None   Collection Time: 06/19/21  4:28 AM  Result Value Ref Range   Color, Urine YELLOW YELLOW   APPearance CLEAR CLEAR   Specific Gravity, Urine 1.013 1.005 - 1.030   pH 8.0 5.0 - 8.0   Glucose, UA NEGATIVE NEGATIVE mg/dL   Hgb urine dipstick NEGATIVE NEGATIVE   Bilirubin Urine NEGATIVE NEGATIVE   Ketones, ur NEGATIVE NEGATIVE mg/dL   Protein, ur NEGATIVE NEGATIVE mg/dL   Nitrite NEGATIVE NEGATIVE   Leukocytes,Ua NEGATIVE NEGATIVE    Comment: Performed at [facility], [address], [place], [place] [address]  Pregnancy, urine     Status: None   Collection Time: 06/19/21  4:28 AM  Result Value Ref Range   Preg Test, Ur NEGATIVE NEGATIVE    Comment:        THE SENSITIVITY OF THIS METHODOLOGY IS >20 mIU/mL. Performed at [facility], [address], [place], [place] [address]   Lactic acid, plasma     Status: None   Collection Time: 06/19/21  5:10 AM  Result Value Ref Range   Lactic Acid, Venous 0.8 0.5 - 1.9 mmol/L    Comment:  Performed at [facility], [address], [place], [place] [address]  Comprehensive metabolic panel     Status: Abnormal   Collection Time: 06/19/21  5:10 AM  Result Value Ref Range   Sodium 136 135 - 145 mmol/L   Potassium 3.8 3.5 - 5.1 mmol/L   Chloride 102 98 - 111 mmol/L   CO2 28 22 - 32 mmol/L   Glucose, Bld 104 (H) 70 - 99 mg/dL    Comment: Glucose reference range applies only to samples taken after fasting for at least 8 hours.   BUN 16 6 - 20 mg/dL   Creatinine, Ser 0.80 0.44 - 1.00 mg/dL   Calcium 9.3 8.9 - 10.3 mg/dL   Total Protein 7.2 6.5 - 8.1 g/dL   Albumin 4.1 3.5 - 5.0 g/dL   AST 28 15 - 41 U/L   ALT 30 0 - 44 U/L   Alkaline Phosphatase 106 38 - 126 U/L   Total Bilirubin 0.2 (L) 0.3 - 1.2 mg/dL   GFR, Estimated >60 >60 mL/min     Comment: (NOTE) Calculated using the CKD-EPI Creatinine Equation (2021)    Anion gap 6 5 - 15    Comment: Performed at [facility], [address] [place], [place] [address]  CBC with Differential     Status: Abnormal   Collection Time: 06/19/21  5:10 AM  Result Value Ref Range   WBC 10.6 (H) 4.0 - 10.5 K/uL   RBC 4.01 3.87 - 5.11 MIL/uL   Hemoglobin 13.4 12.0 - 15.0 g/dL   HCT 39.5 36.0 - 46.0 %   MCV 98.5 80.0 - 100.0 fL   MCH 33.4 26.0 - 34.0 pg   MCHC 33.9 30.0 - 36.0 g/dL   RDW 12.8 11.5 - 15.5 %   Platelets 320 150 - 400 K/uL   nRBC 0.0 0.0 - 0.2 %   Neutrophils Relative % 78 %   Neutro Abs 8.3 (H) 1.7 - 7.7 K/uL   Lymphocytes Relative 8 %   Lymphs Abs 0.8 0.7 - 4.0 K/uL   Monocytes Relative 11 %   Monocytes Absolute 1.2 (H) 0.1 - 1.0 K/uL   Eosinophils Relative 2 %   Eosinophils Absolute 0.2 0.0 - 0.5 K/uL   Basophils Relative 0 %   Basophils Absolute 0.0 0.0 - 0.1 K/uL   Immature Granulocytes 1 %   Abs Immature Granulocytes 0.06 0.00 - 0.07 K/uL    Comment: Performed at [facility], [address] [place], [place] [address]  Protime-INR     Status: None   Collection Time: 06/19/21  5:10 AM  Result Value Ref Range   Prothrombin Time 12.4 11.4 - 15.2 seconds   INR 0.9 0.8 - 1.2    Comment: (NOTE) INR goal varies based on device and disease states. Performed at [facility], [address], [place], [place] [address]   APTT     Status: None   Collection Time: 06/19/21  5:10 AM  Result Value Ref Range   aPTT 34 24 - 36 seconds    Comment: Performed at [facility], [address], [place], [place] [address]  Blood Culture (routine x 2)     Status: None (Preliminary result)   Collection Time: 06/19/21  5:10 AM   Specimen: Right Antecubital; Blood  Result Value Ref Range   Specimen Description      RIGHT ANTECUBITAL BOTTLES DRAWN AEROBIC AND ANAEROBIC   Special Requests      Blood Culture adequate volume Performed at  [facility], [address]  [place], [place] [address]    Culture PENDING    Report Status PENDING   Troponin I (High Sensitivity)     Status: None   Collection Time: 06/19/21  5:20 AM  Result Value Ref Range   Troponin I (High Sensitivity) 3 <18 ng/L    Comment: (NOTE) Elevated high sensitivity troponin I (hsTnI) values and significant  changes across serial measurements may suggest ACS but many other  chronic and acute conditions are known to elevate hsTnI results.  Refer to the "Links" section for chest pain algorithms and additional  guidance. Performed at [facility], [address] [place], [place] [address]   Lactic acid, plasma     Status: None   Collection Time: 06/19/21  7:02 AM  Result Value Ref Range   Lactic Acid, Venous 1.2 0.5 - 1.9 mmol/L    Comment: Performed at [facility], [address] [place], [place] [address]  Blood Culture (routine x 2)     Status: None (Preliminary result)   Collection Time: 06/19/21  7:02 AM   Specimen: BLOOD RIGHT HAND  Result Value Ref Range   Specimen Description      BLOOD RIGHT HAND BOTTLES DRAWN AEROBIC AND ANAEROBIC   Special Requests      Blood Culture adequate volume Performed at [facility], [address], [place], [place] [address]    Culture PENDING    Report Status PENDING   Troponin I (High Sensitivity)     Status: None   Collection Time: 06/19/21  7:55 AM  Result Value Ref Range   Troponin I (High Sensitivity) 3 <18 ng/L    Comment: (NOTE) Elevated high sensitivity troponin I (hsTnI) values and significant  changes across serial measurements may suggest ACS but many other  chronic and acute conditions are known to elevate hsTnI results.  Refer to the "Links" section for chest pain algorithms and additional  guidance. Performed at [facility], [address] [place],  [address]     CT CHEST W CONTRAST  Result Date: 06/19/2021 CLINICAL DATA:  Redness with swelling over the patient's left-sided Port-A-Cath. History of breast cancer.  EXAM: CT CHEST WITH CONTRAST TECHNIQUE: Multidetector CT imaging of the chest was performed during intravenous contrast administration. RADIATION DOSE REDUCTION: This exam was performed according to the departmental dose-optimization program which includes automated exposure control, adjustment of the mA and/or kV according to patient size and/or use of iterative reconstruction technique. CONTRAST:  67m OMNIPAQUE IOHEXOL 300 MG/ML  SOLN COMPARISON:  None Available. FINDINGS: Cardiovascular: The heart size is normal. No substantial pericardial effusion. No thoracic aortic aneurysm. No substantial atherosclerosis of the thoracic aorta. Left-sided Port-A-Cath tip is positioned in the upper right atrium. There is edema/inflammation in the subcutaneous tissue surrounding the port device in the left anterior chest wall. No discrete or rim enhancing fluid collection to suggest the presence of an abscess. 8 mm focal soft tissue density noted in the cutaneous/superficial subcutaneous tissues just cranial to the port device. Mediastinum/Nodes: No mediastinal lymphadenopathy. There is no hilar lymphadenopathy. The esophagus has normal imaging features. 11 mm left axillary node on 43/2 is upper normal to borderline enlarged. Small subpectoral nodes evident on the left. Lungs/Pleura: Subpleural reticulation anterior right upper lobe and right middle lobe likely sequelae of radiation therapy. No suspicious pulmonary nodule or mass. No focal airspace consolidation. Upper Abdomen: 2.4 x 2.9 cm subtle hypervascular lesion is identified in the central right liver on 146/2. Musculoskeletal: No  worrisome lytic or sclerotic osseous abnormality. IMPRESSION: 1. Edema/inflammation in the subcutaneous tissue surrounding the port device in the left anterior chest wall. Imaging features could be compatible cellulitis. No discrete or rim enhancing fluid collection to suggest the presence of an abscess. 2. 8 mm focal soft tissue density in the  cutaneous/superficial subcutaneous tissues just cranial to the port device. Finding is indeterminate but may be infectious/inflammatory. 3. 2.4 x 2.9 cm subtle hypervascular lesion in the central right liver. Given the history of breast cancer, MRI of the abdomen without and with contrast recommended to further evaluate. 4. Upper normal to mildly enlarged left axillary node. Close follow-up recommended. Electronically Signed   By: Misty Stanley M.D.   On: 06/19/2021 09:38   DG Chest Port 1 View  Result Date: 06/19/2021 CLINICAL DATA:  Sepsis. EXAM: PORTABLE CHEST 1 VIEW COMPARISON:  05/20/2020 FINDINGS: 0434 hours. The lungs are clear without focal pneumonia, edema, pneumothorax or pleural effusion. The cardiopericardial silhouette is within normal limits for size. Left Port-A-Cath tip overlies the distal SVC. The visualized bony structures of the thorax are unremarkable. IMPRESSION: No active disease. Electronically Signed   By: Misty Stanley M.D.   On: 06/19/2021 05:00    ROS 10 point review of systems is negative except as listed above in HPI.   Physical Exam Blood pressure 134/89, pulse 83, temperature 98 F (36.7 C), temperature source Oral, resp. rate 18, height 5' (1.524 m), weight 64.4 kg, SpO2 97 %. Constitutional: well-developed, well-nourished HEENT: pupils equal, round, reactive to light, 68m b/l, moist conjunctiva, external inspection of ears and nose normal, hearing intact Oropharynx: normal oropharyngeal mucosa, normal dentition Neck: no thyromegaly, trachea midline, no midline cervical tenderness to palpation Chest: breath sounds equal bilaterally, normal respiratory effort, no midline or lateral chest wall tenderness to palpation/deformity, port site R chest with erythema and induration Abdomen: soft, NT, no bruising, no hepatosplenomegaly GU: normal female genitalia  Back: no wounds, no thoracic/lumbar spine tenderness to palpation, no thoracic/lumbar spine stepoffs Rectal:  deferred Extremities: 2+ radial and pedal pulses bilaterally, intact motor and sensation bilateral UE and LE, no peripheral edema MSK: normal gait/station, no clubbing/cyanosis of fingers/toes, normal ROM of all four extremities Skin: warm, dry, no rashes Psych: normal memory, normal mood/affect     Assessment/Plan: 17F with infection at port site. I d/w Dr. KDelton Coombesand she has completed all chemo. Will plan to remove port today under local, cont abx x7d for infection. F/u in office.  AJesusita Oka MD [facility]

## 2021-06-19 NOTE — Assessment & Plan Note (Signed)
-  keep area clean and dry -continue oral antibiotics (keflex TID; for a total of 10 days) -follow any further instructions/recommendations from general surgery about wound care.

## 2021-06-19 NOTE — ED Provider Notes (Signed)
[facility] EMERGENCY DEPARTMENT Provider Note   CSN: [id number] Arrival date & time: 06/19/21  0404     History  Chief Complaint  Patient presents with   Lauren Ortega    Lauren Ortega is a 41 y.o. female.  Patient presents to the emergency department for evaluation of infection near her port.  Patient reports that she has developed redness, swelling and a small blister over the port in her left chest.  Her doctor called in [place] which she has taken for several days but the area is not improving.      Home Medications Prior to Admission medications   Medication Sig Start Date End Date Taking? Authorizing Provider  amLODipine (NORVASC) 10 MG tablet Take 10 mg by mouth daily. 04/03/20   [provider]  anastrozole (ARIMIDEX) 1 MG tablet Take 1 tablet (1 mg total) by mouth daily. 03/24/21   Derek Jack, MD  baclofen (LIORESAL) 20 MG tablet Take 20 mg by mouth 3 (three) times daily as needed. 12/03/20   [provider]  cephALEXin (KEFLEX) 500 MG capsule Take 1 capsule (500 mg total) by mouth 3 (three) times daily. 06/18/21   Derek Jack, MD  diphenoxylate-atropine (LOMOTIL) 2.5-0.025 MG tablet Take 2 tablets by mouth 4 (four) times daily as needed for diarrhea or loose stools. 09/03/20   Derek Jack, MD  DULoxetine (CYMBALTA) 30 MG capsule Take 1 capsule (30 mg total) by mouth 2 (two) times daily. 04/15/21   Derek Jack, MD  ELIQUIS 5 MG TABS tablet TAKE 1 TABLET BY MOUTH TWICE A DAY 03/09/21   Derek Jack, MD  FERREX 150 150 MG capsule Take 1 capsule (150 mg total) by mouth daily. 01/28/21   Derek Jack, MD  folic acid (FOLVITE) 1 MG tablet Take by mouth. 06/26/20   [provider]  lidocaine-prilocaine (EMLA) cream Apply a small amount to port a cath site and cover with plastic wrap 1 hour prior to infusion appointments Patient not taking: Reported on 05/26/2021 06/16/20   Derek Jack, MD   magnesium oxide (MAG-OX) 400 (240 Mg) MG tablet TAKE 1 TABLET BY MOUTH IN THE MORNING AND IN THE EVENING 04/06/21   Derek Jack, MD  meloxicam (MOBIC) 15 MG tablet Take 15 mg by mouth daily as needed. 04/03/20   [provider]  oxyCODONE (OXY IR/ROXICODONE) 5 MG immediate release tablet Take 1 tablet (5 mg total) by mouth every 6 (six) hours as needed for moderate pain or severe pain. 05/20/20   Coralie Keens, MD  prochlorperazine (COMPAZINE) 10 MG tablet Take 1 tablet (10 mg total) by mouth every 6 (six) hours as needed (Nausea or vomiting). Patient not taking: Reported on 05/26/2021 06/16/20   Derek Jack, MD  spironolactone (ALDACTONE) 50 MG tablet Take 50 mg by mouth every morning. 03/15/20   [provider]  temazepam (RESTORIL) 15 MG capsule Take 1 capsule (15 mg total) by mouth at bedtime as needed for sleep. 11/09/20   Derek Jack, MD  trastuzumab in sodium chloride 0.9 % 250 mL Inject into the vein every 21 ( twenty-one) days. 06/23/20   [provider]  vitamin B-12 (CYANOCOBALAMIN) 1000 MCG tablet Take 1 tablet (1,000 mcg total) by mouth daily. 01/28/21   Derek Jack, MD      Allergies    Patient has no known allergies.    Review of Systems   Review of Systems  Physical Exam Updated Vital Signs BP 122/74   Pulse 90   Temp 98.3  F (36.8 C) (Oral)   Resp 12   Ht 5' (1.524 m)   Wt 64.4 kg   SpO2 98%   BMI 27.73 kg/m  Physical Exam Vitals and nursing note reviewed.  Constitutional:      General: She is not in acute distress.    Appearance: She is well-developed.  HENT:     Head: Normocephalic and atraumatic.     Mouth/Throat:     Mouth: Mucous membranes are moist.  Eyes:     General: Vision grossly intact. Gaze aligned appropriately.     Extraocular Movements: Extraocular movements intact.     Conjunctiva/sclera: Conjunctivae normal.  Cardiovascular:     Rate and Rhythm: Normal rate and regular rhythm.      Pulses: Normal pulses.     Heart sounds: Normal heart sounds, S1 normal and S2 normal. No murmur heard.   No friction rub. No gallop.  Pulmonary:     Effort: Pulmonary effort is normal. No respiratory distress.     Breath sounds: Normal breath sounds.  Abdominal:     General: Bowel sounds are normal.     Palpations: Abdomen is soft.     Tenderness: There is no abdominal tenderness. There is no guarding or rebound.     Hernia: No hernia is present.  Musculoskeletal:        General: No swelling.     Cervical back: Full passive range of motion without pain, normal range of motion and neck supple. No spinous process tenderness or muscular tenderness. Normal range of motion.     Right lower leg: No edema.     Left lower leg: No edema.  Skin:    General: Skin is warm and dry.     Capillary Refill: Capillary refill takes less than 2 seconds.     Findings: Erythema present. No ecchymosis, rash or wound.     Comments: 4 mm pustule surrounded by erythema, no fluctuance left chest  Neurological:     General: No focal deficit present.     Mental Status: She is alert and oriented to person, place, and time.     GCS: GCS eye subscore is 4. GCS verbal subscore is 5. GCS motor subscore is 6.     Cranial Nerves: Cranial nerves 2-12 are intact.     Sensory: Sensation is intact.     Motor: Motor function is intact.     Coordination: Coordination is intact.  Psychiatric:        Attention and Perception: Attention normal.        Mood and Affect: Mood normal.        Speech: Speech normal.        Behavior: Behavior normal.    ED Results / Procedures / Treatments   Labs (all labs ordered are listed, but only abnormal results are displayed) Labs Reviewed  COMPREHENSIVE METABOLIC PANEL - Abnormal; Notable for the following components:      Result Value   Glucose, Bld 104 (*)    Total Bilirubin 0.2 (*)    All other components within normal limits  CBC WITH DIFFERENTIAL/PLATELET - Abnormal; Notable  for the following components:   WBC 10.6 (*)    Neutro Abs 8.3 (*)    Monocytes Absolute 1.2 (*)    All other components within normal limits  CULTURE, BLOOD (ROUTINE X 2)  CULTURE, BLOOD (ROUTINE X 2)  URINE CULTURE  LACTIC ACID, PLASMA  PROTIME-INR  APTT  URINALYSIS, ROUTINE W REFLEX MICROSCOPIC  PREGNANCY,  URINE  LACTIC ACID, PLASMA  TROPONIN I (HIGH SENSITIVITY)    EKG EKG Interpretation  Date/Time:  Saturday Jun 19 2021 05:07:37 EDT Ventricular Rate:  83 PR Interval:  140 QRS Duration: 118 QT Interval:  379 QTC Calculation: 446 R Axis:   61 Text Interpretation: Sinus rhythm Incomplete left bundle branch block ST depr, consider ischemia, inferior leads Baseline wander in lead(s) V1 V2 Confirmed by Orpah Greek [phone]) on 06/19/2021 5:35:35 AM  Radiology DG Bone Density  Result Date: 06/17/2021 EXAM: DUAL X-RAY ABSORPTIOMETRY (DXA) FOR BONE MINERAL DENSITY IMPRESSION: Your patient Jerzey Komperda completed a BMD test on 06/17/2021 using the [place] (software version: 14.10) manufactured by [employer]. The following summarizes the results of our evaluation. Technologist: [facility] PATIENT BIOGRAPHICAL: Name: Mersades, Barbaro Patient ID: [id number] Birth Date: Jun 04, 1980 Height: 60.0 in. Gender: Female Exam Date: 06/17/2021 Weight: 142.0 lbs. Indications: Caucasian, Hx Breast Ca, Low Calcium Intake, Post Menopausal Fractures: Treatments: Anastrozole DENSITOMETRY RESULTS: Site         Region     Measured Date Measured Age WHO Classification Young Adult T-score BMD         %Change vs. Previous Significant Change (*) DualFemur Total Left 06/17/2021 43.2 Osteopenia -1.1 0.867 g/cm2 - - DualFemur Total Mean 06/17/2021 43.2 Normal -0.9 0.893 g/cm2 - - Left Forearm Radius 33% 06/17/2021 43.2 Osteopenia -1.1 0.637 g/cm2 - - ASSESSMENT: The BMD measured at Forearm Radius 33% is 0.637 g/cm2 with a T-score of -1.1. This patient is considered osteopenic according  to [facility]) criteria. The scan quality is good. Lumbar spine was excluded due to advanced degenerative changes. World Pharmacologist [facility]) criteria for post-menopausal, Caucasian Women: Normal:       T-score at or above -1 SD Osteopenia:   T-score between -1 and -2.5 SD Osteoporosis: T-score at or below -2.5 SD RECOMMENDATIONS: 1. All patients should optimize calcium and vitamin D intake. 2. Consider FDA-approved medical therapies in postmenopausal women and med aged 62 years and older, based on the following: a. A hip or vertebral (clinical or morphometric) fracture b. T-score < -2.5 at the femoral neck or spine after appropriate evaluation to exclude secondary causes c. Low bone mass (T-score between -1.0 and -2.5 at the femoral neck or spine) and a 10-year probability of a hip fracture > 3% or a 10-year probability of a major osteoporosis-related fracture > 20% based on the [place]-adapted WHO algorithm d. Clinician judgment and/or patient preferences may indicate treatment for people with 10-year fracture probabilities above or below these levels FOLLOW-UP: Patients with diagnosis of osteoporosis or at high risk for fracture should have regular bone mineral density tests. For patients eligible for Medicare, routine testing is allowed once every 2 years. The testing frequency can be increased to one year for patients who have rapidly progressing disease, those who are receiving or discontinuing medical therapy to restore bone mass, or have additional risk factors. I have reviewed this report, and agree with the above findings. Mark A. Thornton Papas, M.D. [facility] Your patient Etoy Mcdonnell completed a FRAX assessment on 06/17/2021 using the [place] (analysis version: 14.10) manufactured by [employer]. The following summarizes the results of our evaluation. PATIENT BIOGRAPHICAL: Name: Jaslene, Marsteller Patient ID: [id number] Birth Date: Jul 12, 1980 Height:    60.0  in. Gender:     Female    Age:        41.2       Weight:  142.0 lbs. Ethnicity:  White                            Exam Date: 06/17/2021 FRAX* RESULTS:  (version: 3.5) 10-year Probability of Fracture1 Major Osteoporotic Fracture2 Hip Fracture 2.4% 0.1% Population: [place] (Caucasian) Risk Factors: None Based on Femur (Left) Neck BMD 1 -The 10-year probability of fracture may be lower than reported if the patient has received treatment. 2 -Major Osteoporotic Fracture: Clinical Spine, Forearm, Hip or Shoulder *FRAX is a Materials engineer of the [employer] for Metabolic Bone Disease, a [place] (WHO) [facility]. ASSESSMENT: The probability of a major osteoporotic fracture is 2.4% within the next ten years. The probability of a hip fracture is 0.1% within the next ten years. I have reviewed this report and agree with the above findings. Mark A. Thornton Papas, M.D. [facility] Electronically Signed   By: Lavonia Dana M.D.   On: 06/17/2021 14:26   DG Chest Port 1 View  Result Date: 06/19/2021 CLINICAL DATA:  Sepsis. EXAM: PORTABLE CHEST 1 VIEW COMPARISON:  05/20/2020 FINDINGS: 0434 hours. The lungs are clear without focal pneumonia, edema, pneumothorax or pleural effusion. The cardiopericardial silhouette is within normal limits for size. Left Port-A-Cath tip overlies the distal SVC. The visualized bony structures of the thorax are unremarkable. IMPRESSION: No active disease. Electronically Signed   By: Misty Stanley M.D.   On: 06/19/2021 05:00    Procedures Procedures    Medications Ordered in ED Medications  vancomycin (VANCOCIN) IVPB 1000 mg/200 mL premix (1,000 mg Intravenous New Bag/Given 06/19/21 0608)    ED Course/ Medical Decision Making/ A&P                           Medical Decision Making Amount and/or Complexity of Data Reviewed Labs: ordered. Radiology: ordered. ECG/medicine tests: ordered.  Risk Prescription drug  management.   Patient presents to the emergency department for evaluation of erythema overlying her chemo port.  Patient reports that she has finished her chemotherapy and the port was going to be scheduled to come out.  She reports that there was always a small area in the center that was raised but now there is a pustule in this region and expanding redness surrounding the area of the port.  She appears well, afebrile.  No signs of sepsis.  Unclear if this is a soft tissue infection or if this is deeper into the port and she is at risk for line sepsis.  Port will need to be taken out.  Patient initiated on vancomycin.        Final Clinical Impression(s) / ED Diagnoses Final diagnoses:  Cellulitis of chest wall    Rx / DC Orders ED Discharge Orders     None         Elyssa Pendelton, Gwenyth Allegra, MD 06/19/21 [phone]

## 2021-06-19 NOTE — ED Notes (Signed)
[facility] here to transport 

## 2021-06-19 NOTE — Progress Notes (Signed)
Infection around chemo port. No longer in cancer treatment. Received vancomycin in the ED. Dr. Arnoldo Morale advises admission at [facility]. This is a carryover.

## 2021-06-19 NOTE — Assessment & Plan Note (Signed)
-  patient to be seen by general surgery for removal -case discussed with Dr. Bobbye Morton and plan is for transfer to perop area and removal under local anesthesia.

## 2021-06-19 NOTE — Assessment & Plan Note (Addendum)
-  continue using arimidex and follow up with oncology service as an outpatient. -patient has completed chemotherapy cycles currently.  -some incidental changes appreciated on her liver with recommendations for outpatient MRI in order to rule out metastatic lesions.

## 2021-06-19 NOTE — Op Note (Signed)
   Operative Note   Date: 06/19/2021  Procedure: removal of port  Pre-op diagnosis: port in place for breast cancer treatment Post-op diagnosis: same  Indication and clinical history: The patient is a 41 y.o. year old female with a port in place for breast cancer treatment, now completed.      Surgeon: Jesusita Oka, MD  Anesthesia: local  Findings:  Specimen: port EBL: <5cc Drains/Implants: none  Disposition: PACU - hemodynamically stable.  Description of procedure: The patient was positioned supine on the operating room table. General anesthetic induction and intubation were uneventful. Foley catheter insertion was performed and was atraumatic. Time-out was performed verifying correct patient, procedure, signature of informed consent, and administration of pre-operative antibiotics. The site was prepped and draped in the usual sterile fashion.  Local anesthetic was infiltrated and an incision made overlying the port. The port was dissected bluntly and then removed. The catheter tubing was removed in one piece with the 21 mark visible. Manual pressure was held and the wound was hemostatic. Three 2-0 vicryl sutures were used to re-approximate the wound and a pressure dressing applied.   All sponge and instrument counts were correct at the conclusion of the procedure. The patient was awakened from anesthesia, extubated uneventfully, and transported to the PACU in good condition. There were no complications.   Upon entering the abdomen (organ space), I encountered infection of the port site .  CASE DATA:  Type of patient?: DOW CASE (Surgical Hospitalist [facility])  Status of Case? URGENT Add On  Infection Present At Time Of Surgery (PATOS)?  INFECTION of the port site    Jesusita Oka, MD General and [facility] [facility]

## 2021-06-19 NOTE — Consult Note (Addendum)
Initial Consultation Note   Patient: Lauren Ortega GPQ:[id number] DOB: [birth year] PCP: [name], FNP DOA: 06/19/2021 DOS: the patient was seen and examined on 06/19/2021 Primary service: Barton Dubois, MD  Referring physician: Dr. Joseph Berkshire Reason for consult: left chest cellulitis   Assessment/Plan: Assessment and Plan: * Cellulitis -keep area clean and dry -continue oral antibiotics (keflex TID; for a total of 10 days) -follow any further instructions/recommendations from general surgery about wound care.    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   HTN (hypertension) -stable overall -continue heart healthy diet -resume home antihypertensive agents (norvasc and spironolactone).  Iron deficiency anemia -stable Hgb appreciated -resume home use of ferrex and B12 -continue follow up with Hem/Onc service.  Port-A-Cath in place -patient to be seen by general surgery for removal -case discussed with Dr. Bobbye Morton and plan is for transfer to perop area and removal under local anesthesia.  Malignant neoplasm of upper-outer quadrant of right female breast ([place]) -continue using arimidex and follow up with oncology service as an outpatient. -patient has completed chemotherapy cycles currently.  -some incidental changes appreciated on her liver with recommendations for outpatient MRI in order to rule out metastatic lesions.   TRH will sign off at present, please call us again when needed.  HPI: Lauren Ortega is a 41 y.o. female with past medical history of HTN, anxiety/depression, iron deficiency anemia, upper extremity DVT and breast cancer who has finish chemotherapy treatment and presented to ED with complaints of left anterior chest pain and erythema.  Symptoms has been present for 3 days or so and worsening, affected are turning more red and with development of small purulent lesion. Patient's area coincidentally is localized on top of her port-a-cath region.  Patient reports tenderness on palpation, but not fluctuation appreciated; she is afebrile, denies palpitations, nausea, vomiting, chills, SOB or any other complaints.   Work up demonstrated left chest wall cellulitis with abscess (appreciated on CT scan) and completely hemodynamically stable patient with bloodwork only demonstrating WBC's of 10.6 otherwise unremarkable. TRH consulted to help with patient's care and disposition.     .  Review of Systems: As mentioned in the history of present illness. All other systems reviewed and are negative. Past Medical History:  Diagnosis Date   Anemia    Anxiety    Breast cancer ([place]) 03/2020   Family history of colon cancer    Family history of lymphoma    Family history of pancreatic cancer    Family history of skin cancer    Hypertension    Port-A-Cath in place 06/16/2020   Past Surgical History:  Procedure Laterality Date   BREAST LUMPECTOMY WITH RADIOACTIVE SEED AND SENTINEL LYMPH NODE BIOPSY Right 05/20/2020   Procedure: RIGHT BREAST LUMPECTOMY WITH RADIOACTIVE SEED AND SENTINEL LYMPH NODE BIOPSY;  Surgeon: Coralie Keens, MD;  Location: [facility];  Service: General;  Laterality: Right;   PORTACATH PLACEMENT Left 05/20/2020   Procedure: INSERTION PORT-A-CATH;  Surgeon: Coralie Keens, MD;  Location: [place];  Service: General;  Laterality: Left;   TONSILLECTOMY     TUBAL LIGATION     Social History:  reports that she has never smoked. She has never used smokeless tobacco. She reports current alcohol use. She reports that she does not use drugs.  No  Known Allergies  Family History  Problem Relation Age of Onset   Basal cell carcinoma Mother 73   Bladder Cancer Maternal Uncle 72   Lymphoma  Maternal Uncle 71       Waldenstroms    Pancreatic cancer Maternal Grandmother    Non-Hodgkin's lymphoma Maternal Grandfather    Lung cancer Paternal Grandfather    Colon cancer Other        x8 or 9   Colon cancer Maternal Great-grandfather     Prior to Admission medications   Medication Sig Start Date End Date Taking? Authorizing Provider  amLODipine (NORVASC) 10 MG tablet Take 10 mg by mouth daily. 04/03/20  Yes [provider]  anastrozole (ARIMIDEX) 1 MG tablet Take 1 tablet (1 mg total) by mouth daily. 03/24/21  Yes Derek Jack, MD  baclofen (LIORESAL) 20 MG tablet Take 20 mg by mouth 3 (three) times daily as needed (back spasms). 12/03/20  Yes [provider]  cephALEXin (KEFLEX) 500 MG capsule Take 1 capsule (500 mg total) by mouth 3 (three) times daily. 06/18/21  Yes Derek Jack, MD  diphenoxylate-atropine (LOMOTIL) 2.5-0.025 MG tablet Take 2 tablets by mouth 4 (four) times daily as needed for diarrhea or loose stools. 09/03/20  Yes Derek Jack, MD  DULoxetine (CYMBALTA) 30 MG capsule Take 1 capsule (30 mg total) by mouth 2 (two) times daily. Patient taking differently: Take 60 mg by mouth daily. 04/15/21  Yes Derek Jack, MD  ELIQUIS 5 MG TABS tablet TAKE 1 TABLET BY MOUTH TWICE A DAY Patient taking differently: Take 5 mg by mouth 2 (two) times daily. 03/09/21  Yes Derek Jack, MD  FERREX 150 150 MG capsule Take 1 capsule (150 mg total) by mouth daily. 01/28/21  Yes Derek Jack, MD  folic acid (FOLVITE) 1 MG tablet Take 1 mg by mouth daily. 06/26/20  Yes [provider]  magnesium oxide (MAG-OX) 400 (240 Mg) MG tablet TAKE 1 TABLET BY MOUTH IN THE MORNING AND IN THE EVENING Patient taking differently: Take 400 mg by mouth 2 (two) times daily. 04/06/21  Yes Derek Jack, MD  spironolactone (ALDACTONE) 50 MG tablet Take 50 mg by mouth every morning. 03/15/20  Yes [provider]  temazepam (RESTORIL)  15 MG capsule Take 1 capsule (15 mg total) by mouth at bedtime as needed for sleep. 11/09/20  Yes Derek Jack, MD  vitamin B-12 (CYANOCOBALAMIN) 1000 MCG tablet Take 1 tablet (1,000 mcg total) by mouth daily. 01/28/21  Yes Derek Jack, MD  lidocaine-prilocaine (EMLA) cream Apply a small amount to port a cath site and cover with plastic wrap 1 hour prior to infusion appointments Patient taking differently: Apply 1 application. topically once. Apply a small amount to port a cath site and cover with plastic wrap 1 hour prior to infusion appointments 06/16/20   Derek Jack, MD  oxyCODONE (OXY IR/ROXICODONE) 5 MG immediate release tablet Take 1 tablet (5 mg total) by mouth every 6 (six) hours as needed for moderate pain or severe pain. 05/20/20   Coralie Keens, MD  trastuzumab in sodium chloride 0.9 % 250 mL Inject into the vein every 21 ( twenty-one) days. 06/23/20   [provider]    Physical Exam: Vitals:   06/19/21 0745 06/19/21 0800 06/19/21 0830 06/19/21 0900  BP:  116/89 124/80 125/79  Pulse: 87 88 90 92  Resp: 20 19 (!) 21 16  Temp:      TempSrc:      SpO2: 95% 96% 94% 99%  Weight:      Height:       General exam: Alert, awake, oriented x 3, no fever, no nausea, no vomiting. Reporting pain in her left anterior chest wall where cellulitic process is present. Respiratory system: Clear to auscultation. Respiratory effort normal. Good sat on RA> Cardiovascular system:RRR. No murmurs, rubs, gallops. No JVD Gastrointestinal system: Abdomen is nondistended, soft and nontender. No organomegaly or masses felt. Normal bowel sounds heard. Central nervous system: Alert and oriented. No focal neurological deficits. Extremities: No Cyanosis or clubbing.  Skin: left anterior chest with erythema, tenderness on palpation and warm sensation. No active drainage seen currently.  Psychiatry: Judgement and insight appear normal. Mood & affect appropriate.   Data  Reviewed:  CBC: WBC 10.6, Hgb 13.4 and platelet count 320K Comprehensive metabolic panel: sodium 253, K 3.8, BUN 16, Cr 0.80, normal LFT's. Lactic acid 0.8 Refer to [employer] for pictures of cellulitic area. CT chest: 1. Edema/inflammation in the subcutaneous tissue surrounding the port device in the left anterior chest wall. Imaging features could be compatible cellulitis. No discrete or rim enhancing fluid collection to suggest the presence of an abscess. 2. 8 mm focal soft tissue density in the cutaneous/superficial subcutaneous tissues just cranial to the port device. Finding is indeterminate but may be infectious/inflammatory. 3. 2.4 x 2.9 cm subtle hypervascular lesion in the central right liver. Given the history of breast cancer, MRI of the abdomen without and with contrast recommended to further evaluate. 4. Upper normal to mildly enlarged left axillary node. Close follow-up recommended.  Family Communication: no family at bedside Primary team communication:  Thank you very much for involving [place] in the care of your patient.  Author: Barton Dubois, MD 06/19/2021 10:31 AM  For on call review [email or web address].

## 2021-06-19 NOTE — Assessment & Plan Note (Signed)
-  stable Hgb appreciated -resume home use of ferrex and B12 -continue follow up with Hem/Onc service.

## 2021-06-19 NOTE — Assessment & Plan Note (Signed)
-  overall with stable mood -continue the use of cymbalta and restoril

## 2021-06-19 NOTE — Assessment & Plan Note (Signed)
-  continue the use of eliquis -continue follow up with Hem/Onc specialist.

## 2021-06-20 LAB — URINE CULTURE: Culture: NO GROWTH

## 2021-06-22 ENCOUNTER — Encounter (HOSPITAL_COMMUNITY): Payer: Self-pay

## 2021-06-22 ENCOUNTER — Encounter (HOSPITAL_COMMUNITY): Payer: Self-pay | Admitting: Hematology

## 2021-06-22 ENCOUNTER — Encounter (HOSPITAL_COMMUNITY): Payer: Self-pay | Admitting: Surgery

## 2021-06-22 ENCOUNTER — Other Ambulatory Visit (HOSPITAL_COMMUNITY): Payer: Self-pay

## 2021-06-22 DIAGNOSIS — K769 Liver disease, unspecified: Secondary | ICD-10-CM

## 2021-06-22 NOTE — Progress Notes (Signed)
Order placed for MRI abdomen with and without per Dr. Delton Coombes verbal order.

## 2021-06-23 LAB — SURGICAL PATHOLOGY

## 2021-06-24 ENCOUNTER — Telehealth (HOSPITAL_COMMUNITY): Payer: Self-pay | Admitting: Hematology

## 2021-06-24 LAB — CULTURE, BLOOD (ROUTINE X 2)
Culture: NO GROWTH
Culture: NO GROWTH
Special Requests: ADEQUATE
Special Requests: ADEQUATE

## 2021-06-24 NOTE — Telephone Encounter (Signed)
Recvd a call from [employer] requesting tx info. Advised that pt completed chemo on 05/26/21 has upcoming scans and ov scheduled. Advised her that no further chemo is scheduled at this time however that may change.   Per Wells Guiles, call if additional chemo is needed.  [phone]

## 2021-07-01 ENCOUNTER — Ambulatory Visit (HOSPITAL_COMMUNITY)
Admission: RE | Admit: 2021-07-01 | Discharge: 2021-07-01 | Disposition: A | Discharge status: Home / Self Care | Payer: PRIVATE HEALTH INSURANCE | Source: Ambulatory Visit | Attending: Hematology | Admitting: Hematology

## 2021-07-01 DIAGNOSIS — K769 Liver disease, unspecified: Secondary | ICD-10-CM

## 2021-07-01 MED ORDER — GADOBUTROL 1 MMOL/ML IV SOLN
6.0000 mL | Freq: Once | INTRAVENOUS | Status: AC | PRN
  Administered 2021-07-01: 6 mL via INTRAVENOUS

## 2021-07-01 NOTE — Discharge Summary (Signed)
Patient ID: Lauren Ortega [id number] [birth year] 41 y.o.  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 ([place]) 06/19/2021   Anxiety and depression 06/19/2021   Iron deficiency anemia 05/04/2021   Port-A-Cath in place 06/16/2020   Genetic testing 05/12/2020   Family history of pancreatic cancer    Family history of skin cancer    Family history of lymphoma    Family history of colon cancer    Malignant neoplasm of upper-outer quadrant of right female breast ([place]) 04/16/2020    Consultants none  Reason for Admission: Port infection  Procedures Port removal  Hospital Course:  Uncomplicated    Physical Exam: Gen: comfortable, no distress Neuro: non-focal exam HEENT: PERRL Neck: supple CV: RRR Pulm: unlabored breathing Abd: soft, NT GU: clear yellow urine Extr: wwp, no edema   Allergies as of 06/19/2021   No Known Allergies      Medication List     TAKE these medications    acetaminophen 500 MG tablet Commonly known as: TYLENOL Take 2 tablets (1,000 mg total) by mouth 4 (four) times daily.   amLODipine 10 MG tablet Commonly known as: NORVASC Take 10 mg by mouth daily.   anastrozole 1 MG tablet Commonly known as: ARIMIDEX Take 1 tablet (1 mg total) by mouth daily.   baclofen 20 MG tablet Commonly known as: LIORESAL Take 20 mg by mouth 3 (three) times daily as needed (back spasms).   cephALEXin 500 MG capsule Commonly known as: KEFLEX Take 1 capsule (500 mg total) by mouth 3 (three) times daily.   diphenoxylate-atropine 2.5-0.025 MG tablet Commonly known as: LOMOTIL Take 2 tablets by mouth 4 (four) times daily as needed for diarrhea or loose stools.   docusate sodium 100 MG capsule Commonly  known as: Colace Take 1 capsule (100 mg total) by mouth 2 (two) times daily.   DULoxetine 30 MG capsule Commonly known as: CYMBALTA Take 1 capsule (30 mg total) by mouth 2 (two) times daily. What changed:  how much to take when to take this   Eliquis 5 MG Tabs tablet Generic drug: apixaban TAKE 1 TABLET BY MOUTH TWICE A DAY What changed: how much to take   Ferrex 150 150 MG capsule Generic drug: iron polysaccharides Take 1 capsule (150 mg total) by mouth daily.   folic acid 1 MG tablet Commonly known as: FOLVITE Take 1 mg by mouth daily.   ibuprofen 600 MG tablet Commonly known as: ADVIL Take 1 tablet (600 mg total) by mouth 4 (four) times daily.   lidocaine-prilocaine cream Commonly known as: EMLA Apply a small amount to port a cath site and cover with plastic wrap 1 hour prior to infusion appointments What changed:  how much to take how to take this when to take this   magnesium oxide 400 (240 Mg) MG tablet Commonly known as: MAG-OX TAKE 1 TABLET BY MOUTH IN THE MORNING AND IN THE EVENING What changed: See the new instructions.   methocarbamol 750 MG tablet Commonly known as: Robaxin-750 Take 1 tablet (750 mg total) by mouth 4 (four) times daily.   oxyCODONE 5 MG immediate release tablet Commonly known as: Oxy IR/ROXICODONE Take 1 tablet (5 mg total) by mouth every 4 (four) hours as needed for severe pain. What changed:  when to take this reasons to take this   spironolactone 50 MG tablet Commonly known as: ALDACTONE Take 50 mg by mouth every morning.   temazepam 15 MG capsule Commonly known as: RESTORIL Take 1 capsule (15 mg total) by mouth at bedtime as needed for sleep.   trastuzumab in sodium chloride 0.9 % 250 mL Inject into the vein every 21 ( twenty-one) days.   vitamin B-12 1000 MCG tablet Commonly known as: CYANOCOBALAMIN Take 1 tablet (1,000 mcg total) by mouth daily.          Follow-up [facility]  Follow up in 1 week(s).   Specialty: General Surgery Why: For wound re-check Contact information: [place]        Jesusita Oka, MD .   Specialty: Surgery Contact information: [place] [address] [phone]                  Signed: Jesusita Oka, [facility] 07/01/2021, 8:58 PM

## 2021-07-03 ENCOUNTER — Other Ambulatory Visit: Payer: Self-pay | Admitting: Nurse Practitioner

## 2021-07-05 ENCOUNTER — Inpatient Hospital Stay (HOSPITAL_COMMUNITY): Payer: PRIVATE HEALTH INSURANCE | Attending: Hematology | Admitting: Hematology

## 2021-07-05 ENCOUNTER — Encounter (HOSPITAL_COMMUNITY): Payer: Self-pay | Admitting: Hematology

## 2021-07-05 ENCOUNTER — Encounter (HOSPITAL_COMMUNITY): Payer: Self-pay

## 2021-07-05 VITALS — BP 123/76 | HR 81 | Temp 97.9°F | Resp 18 | Ht 60.0 in | Wt 141.6 lb

## 2021-07-05 DIAGNOSIS — Z79811 Long term (current) use of aromatase inhibitors: Secondary | ICD-10-CM | POA: Insufficient documentation

## 2021-07-05 DIAGNOSIS — K769 Liver disease, unspecified: Secondary | ICD-10-CM

## 2021-07-05 DIAGNOSIS — Z5112 Encounter for antineoplastic immunotherapy: Secondary | ICD-10-CM | POA: Diagnosis not present

## 2021-07-05 DIAGNOSIS — Z170 Estrogen receptor positive status [ER+]: Secondary | ICD-10-CM | POA: Diagnosis not present

## 2021-07-05 DIAGNOSIS — Z7901 Long term (current) use of anticoagulants: Secondary | ICD-10-CM | POA: Insufficient documentation

## 2021-07-05 DIAGNOSIS — Z86718 Personal history of other venous thrombosis and embolism: Secondary | ICD-10-CM | POA: Insufficient documentation

## 2021-07-05 DIAGNOSIS — D509 Iron deficiency anemia, unspecified: Secondary | ICD-10-CM

## 2021-07-05 DIAGNOSIS — C50411 Malignant neoplasm of upper-outer quadrant of right female breast: Secondary | ICD-10-CM | POA: Diagnosis not present

## 2021-07-05 MED ORDER — LEUPROLIDE ACETATE (3 MONTH) 11.25 MG IM KIT: 11.2500 mg | PACK | Freq: Once | INTRAMUSCULAR | Status: DC

## 2021-07-05 NOTE — Progress Notes (Signed)
[facility] [address] [place] [address]   Patient Care Team: [name], FNP as PCP - General (Family Medicine) Brien Mates, RN as Oncology Nurse Navigator (Oncology)  SUMMARY OF ONCOLOGIC HISTORY: Oncology History  Malignant neoplasm of upper-outer quadrant of right female breast ([place])  04/16/2020 Initial Diagnosis   Malignant neoplasm of upper-outer quadrant of right female breast [facility])    Genetic Testing   Negative genetic testing. No pathogenic variants identified on the [facility] Multi-Cancer Panel+RNA. The report date is 05/10/2020.  The Multi-Cancer Panel + RNA offered by [facility] includes sequencing and/or deletion duplication testing of the following 84 genes: AIP, ALK, APC, ATM, AXIN2,BAP1,  BARD1, BLM, BMPR1A, BRCA1, BRCA2, BRIP1, CASR, CDC73, CDH1, CDK4, CDKN1B, CDKN1C, CDKN2A (p14ARF), CDKN2A (p16INK4a), CEBPA, CHEK2, CTNNA1, DICER1, DIS3L2, EGFR (c.2369C>T, p.Thr790Met variant only), EPCAM (Deletion/duplication testing only), FH, FLCN, GATA2, GPC3, GREM1 (Promoter region deletion/duplication testing only), HOXB13 (c.251G>A, p.Gly84Glu), HRAS, KIT, MAX, MEN1, MET, MITF (c.952G>A, p.Glu318Lys variant only), MLH1, MSH2, MSH3, MSH6, MUTYH, NBN, NF1, NF2, NTHL1, PALB2, PDGFRA, PHOX2B, PMS2, POLD1, POLE, POT1, PRKAR1A, PTCH1, PTEN, RAD50, RAD51C, RAD51D, RB1, RECQL4, RET, RUNX1, SDHAF2, SDHA (sequence changes only), SDHB, SDHC, SDHD, SMAD4, SMARCA4, SMARCB1, SMARCE1, STK11, SUFU, TERC, TERT, TMEM127, TP53, TSC1, TSC2, VHL, WRN and WT1.   06/23/2020 - 05/26/2021 Chemotherapy   Patient is on Treatment Plan : BREAST Docetaxel + Carboplatin + Trastuzumab (TCH) q21d / Trastuzumab q21d       CHIEF COMPLIANT: Follow-up for right breast cancer   INTERVAL HISTORY: [name] is a 41 y.o. female here today for follow up of her right breast cancer. Her last visit was on 05/26/2021.  Today she reports feeling good. She has stopped Eliquis following her  port removal. She continues to take anastrozole. She reports she had a menses starting on 6/5. She reports her hot flashes have stopped following her port removal. She does not take calcium or vitamin D.   REVIEW OF SYSTEMS:   Review of Systems  Constitutional:  Negative for appetite change and fatigue.  Endocrine: Negative for hot flashes.  Psychiatric/Behavioral:  Positive for sleep disturbance.   All other systems reviewed and are negative.   I have reviewed the past medical history, past surgical history, social history and family history with the patient and they are unchanged from previous note.   ALLERGIES:   has No Known Allergies.   MEDICATIONS:  Current Outpatient Medications  Medication Sig Dispense Refill   acetaminophen (TYLENOL) 500 MG tablet Take 2 tablets (1,000 mg total) by mouth 4 (four) times daily. 120 tablet 3   amLODipine (NORVASC) 10 MG tablet Take 10 mg by mouth daily.     anastrozole (ARIMIDEX) 1 MG tablet Take 1 tablet (1 mg total) by mouth daily. 30 tablet 6   baclofen (LIORESAL) 20 MG tablet Take 20 mg by mouth 3 (three) times daily as needed (back spasms).     diphenoxylate-atropine (LOMOTIL) 2.5-0.025 MG tablet Take 2 tablets by mouth 4 (four) times daily as needed for diarrhea or loose stools. 30 tablet 3   docusate sodium (COLACE) 100 MG capsule Take 1 capsule (100 mg total) by mouth 2 (two) times daily. 60 capsule 2   DULoxetine (CYMBALTA) 30 MG capsule Take 1 capsule (30 mg total) by mouth 2 (two) times daily. (Patient taking differently: Take 60 mg by mouth daily.) 180 capsule 1   FERREX 150 150 MG capsule Take 1 capsule (150 mg total) by mouth daily.  90 capsule 3   folic acid (FOLVITE) 1 MG tablet Take 1 mg by mouth daily.     ibuprofen (ADVIL) 600 MG tablet Take 1 tablet (600 mg total) by mouth 4 (four) times daily. 120 tablet 1   magnesium oxide (MAG-OX) 400 (240 Mg) MG tablet TAKE 1 TABLET BY MOUTH IN THE MORNING AND IN THE EVENING (Patient taking  differently: Take 400 mg by mouth 2 (two) times daily.) 60 tablet 5   oxyCODONE (OXY IR/ROXICODONE) 5 MG immediate release tablet Take 1 tablet (5 mg total) by mouth every 4 (four) hours as needed for severe pain. 15 tablet 0   spironolactone (ALDACTONE) 50 MG tablet Take 50 mg by mouth every morning.     temazepam (RESTORIL) 15 MG capsule Take 1 capsule (15 mg total) by mouth at bedtime as needed for sleep. 30 capsule 2   trastuzumab in sodium chloride 0.9 % 250 mL Inject into the vein every 21 ( twenty-one) days.     vitamin B-12 (CYANOCOBALAMIN) 1000 MCG tablet Take 1 tablet (1,000 mcg total) by mouth daily. 90 tablet 3   No current facility-administered medications for this visit.     PHYSICAL EXAMINATION: Performance status (ECOG): 0 - Asymptomatic  Vitals:   07/05/21 0941  BP: 123/76  Pulse: 81  Resp: 18  Temp: 97.9 F (36.6 C)  SpO2: 97%   Wt Readings from Last 3 Encounters:  07/05/21 141 lb 9.6 oz (64.2 kg)  06/19/21 142 lb (64.4 kg)  06/14/21 142 lb (64.4 kg)   Physical Exam Vitals reviewed.  Constitutional:      Appearance: Normal appearance.  Cardiovascular:     Rate and Rhythm: Normal rate and regular rhythm.     Pulses: Normal pulses.     Heart sounds: Normal heart sounds.  Pulmonary:     Effort: Pulmonary effort is normal.     Breath sounds: Normal breath sounds.  Neurological:     General: No focal deficit present.     Mental Status: She is alert and oriented to person, place, and time.  Psychiatric:        Mood and Affect: Mood normal.        Behavior: Behavior normal.     Breast Exam Chaperone: Thana Ates     LABORATORY DATA:  I have reviewed the data as listed    Latest Ref Rng & Units 06/19/2021    5:10 AM 05/26/2021   11:54 AM 05/05/2021   11:45 AM  CMP  Glucose 70 - 99 mg/dL 104  138  84   BUN 6 - 20 mg/dL _0 Creatinine 0.44 - 1.00 mg/dL 0.80  0.78  0.73   Sodium 135 - 145 mmol/L 136  136  134   Potassium 3.5 - 5.1 mmol/L  3.8  3.7  3.7   Chloride 98 - 111 mmol/L 102  103  103   CO2 22 - 32 mmol/L _1 Calcium 8.9 - 10.3 mg/dL 9.3  9.2  8.9   Total Protein 6.5 - 8.1 g/dL 7.2  7.1  6.8   Total Bilirubin 0.3 - 1.2 mg/dL 0.2  0.4  0.3   Alkaline Phos 38 - 126 U/L 106  85  81   AST 15 - 41 U/L 28  32  32   ALT 0 - 44 U/L 30  34  27    No results found for: "ESP233" Lab Results  Component  Value Date   WBC 10.6 (H) 06/19/2021   HGB 13.4 06/19/2021   HCT 39.5 06/19/2021   MCV 98.5 06/19/2021   PLT 320 06/19/2021   NEUTROABS 8.3 (H) 06/19/2021    ASSESSMENT:  1.  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  She had mammogram/ultrasound on 03/18/2020 done in [place] which showed a 1.4 x 0.9 x 1.5 cm mass in the upper outer quadrant of the right breast. -Biopsy on 03/31/2020 consistent with intermediate to high-grade infiltrating ductal carcinoma of the right breast at 10:30 position, HER-2 3+ positive, Ki-67 40%, ER weak staining in less than 10% of tumor cells, PR negative. -MRI of the breast on 04/20/2020 with 1.3 x 1.5 x 1.4 cm irregular enhancing mass within the upper outer right breast.  No abnormal appearing lymph nodes. - Right breast lumpectomy and SLNB on 05/20/2020- grade 3 IDC, 1.8 cm, invasive carcinoma is less than 1 mm from anterior margin focally and less than 1 mm from the posterior margin broadly.  Margins negative for in situ carcinoma.  Lymphovascular invasion present.  0/5 lymph nodes positive.  There is focal ductal carcinoma within the vascular space in the soft tissue adjacent to the lymph node.  No carcinoma is seen within the lymph nodes.  PT1CPN0. - Genetic testing was negative. - 6 cycles of [place] from 06/23/2020 through 10/07/2020. - XRT to the breast, 20 fractions completed on 01/15/2021 with Dr. Barnetta Chapel in [place]. - Tamoxifen was discontinued secondary to leg pains. - Estradiol and FSH  levels were in the postmenopausal range.  She did not have any manses since the start of chemotherapy in June 2022. - Anastrozole started on 03/24/2021.   2.  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  Never smoker. -Maternal grandmother had colon cancer.  Maternal grandfather died of non-Hodgkin's lymphoma.  Paternal grandfather died of lung cancer.  Maternal uncle had multiple myeloma.  Mother had basal cell skin cancer.  3.  Left upper extremity DVT: - Doppler on 07/06/2020 positive for acute DVT in the left axillary, peripheral subclavian vein and superficial thrombosis involving left basilic vein. - This is port induced and she was started on Eliquis.   PLAN:  1.  Stage I (T1 cN0 M0) right breast IDC, HER2 positive: - She has completed 1 year of Herceptin. - Last mammogram on 03/30/2021 was BI-RADS Category 2. - She is tolerating anastrozole very well. - She reportedly started back her menses on 06/28/2021. - I have recommended starting her on Lupron 11.25 mg every 3 months and continue anastrozole with it.  We discussed the side effects.  I will see her back in 3 months for follow-up.   2.  Left upper extremity DVT: -She had port removed recently.  We have discontinued Eliquis.  3.  Osteopenia: - DEXA scan on 06/17/2021 with T score -1.1. - I have recommended her to start taking calcium and vitamin D twice daily.  4.  Peripheral neuropathy: - Continue Cymbalta 60 mg daily.  5.  Hepatic lesion: - CT chest with contrast on 06/19/2021 showed incidental 2.4 x 2.9 cm subtle hypervascular lesion in the central right liver. - We have reviewed MRI of the abdomen with and without contrast which showed 2.7 x 2.2 cm lesion, well-circumscribed with no typical imaging characteristics.  Potential atypical hyalinized/sclerosed hemangioma. - We discussed further options including biopsy  of the liver lesion versus watchful waiting with the MRI  repeated in 3 months.  She is is symptomatic and her recent LFTs were normal.  Upon further discussion, we chose the latter option.  Breast Cancer therapy associated bone loss: I have recommended calcium, Vitamin D and weight bearing exercises.  Orders placed this encounter:  Orders Placed This Encounter  Procedures   MR Abdomen W Wo Contrast    The patient has a good understanding of the overall plan. She agrees with it. She will call with any problems that may develop before the next visit here.  Derek Jack, MD Between [phone]   I, Thana Ates, am acting as a scribe for Dr. Derek Jack.  I, Derek Jack MD, have reviewed the above documentation for accuracy and completeness, and I agree with the above.

## 2021-07-05 NOTE — Patient Instructions (Addendum)
[place] at [facility] Discharge Instructions   You were seen and examined today by Dr. Delton Coombes.  He reviewed the results of your MRI. It is unclear what the lesion in your liver is. It is not suspected to be metastatic breast cancer, but given your history, we will repeat MRI in 3 months to monitor this area in your liver.   We will start you on Lupron injections today and every 3 months.   Start taking calcium + vitamin D daily.   We will see you back in 3 months with repeat lab work and MRI prior.      Thank you for choosing [place] at [facility] to provide your oncology and hematology care.  To afford each patient quality time with our provider, please arrive at least 15 minutes before your scheduled appointment time.   If you have a lab appointment with the [facility] please come in thru the Main Entrance and check in at the main information desk.  You need to re-schedule your appointment should you arrive 10 or more minutes late.  We strive to give you quality time with our providers, and arriving late affects you and other patients whose appointments are after yours.  Also, if you no show three or more times for appointments you may be dismissed from the clinic at the providers discretion.     Again, thank you for choosing [facility].  Our hope is that these requests will decrease the amount of time that you wait before being seen by our physicians.       _____________________________________________________________  Should you have questions after your visit to [facility], please contact our office at [phone] and follow the prompts.  Our office hours are 8:00 a.m. and 4:30 p.m. Monday - Friday.  Please note that voicemails left after 4:00 p.m. may not be returned until the following business day.  We are closed weekends and major holidays.  You do have access to a nurse 24-7, just call the  main number to the clinic [phone] and do not press any options, hold on the line and a nurse will answer the phone.    For prescription refill requests, have your pharmacy contact our office and allow 72 hours.    Due to Covid, you will need to wear a mask upon entering the hospital. If you do not have a mask, a mask will be given to you at the Main Entrance upon arrival. For doctor visits, patients may have 1 support person age 60 or older with them. For treatment visits, patients can not have anyone with them due to social distancing guidelines and our immunocompromised population.

## 2021-07-12 ENCOUNTER — Inpatient Hospital Stay (HOSPITAL_COMMUNITY): Payer: PRIVATE HEALTH INSURANCE

## 2021-07-12 VITALS — BP 113/74 | HR 67 | Temp 97.9°F | Resp 18 | Wt 142.2 lb

## 2021-07-12 DIAGNOSIS — Z170 Estrogen receptor positive status [ER+]: Secondary | ICD-10-CM

## 2021-07-12 DIAGNOSIS — D509 Iron deficiency anemia, unspecified: Secondary | ICD-10-CM

## 2021-07-12 DIAGNOSIS — Z5112 Encounter for antineoplastic immunotherapy: Secondary | ICD-10-CM | POA: Diagnosis not present

## 2021-07-12 MED ORDER — LEUPROLIDE ACETATE (3 MONTH) 11.25 MG IM KIT
11.2500 mg | PACK | Freq: Once | INTRAMUSCULAR | Status: AC
  Administered 2021-07-12: 11.25 mg via INTRAMUSCULAR
  Filled 2021-07-12: qty 11.25

## 2021-07-12 NOTE — Patient Instructions (Signed)
Leuprolide injection What is this medication? LEUPROLIDE (loo PROE lide) is a man-made hormone. It is used to treat the symptoms of prostate cancer. This medicine may also be used to treat children with early onset of puberty. It may be used for other hormonal conditions. This medicine may be used for other purposes; ask your health care provider or pharmacist if you have questions. COMMON BRAND NAME(S): Lupron What should I tell my care team before I take this medication? They need to know if you have any of these conditions: diabetes heart disease or previous heart attack high blood pressure high cholesterol pain or difficulty passing urine spinal cord metastasis stroke tobacco smoker an unusual or allergic reaction to leuprolide, benzyl alcohol, other medicines, foods, dyes, or preservatives pregnant or trying to get pregnant breast-feeding How should I use this medication? This medicine is for injection under the skin or into a muscle. You will be taught how to prepare and give this medicine. Use exactly as directed. Take your medicine at regular intervals. Do not take your medicine more often than directed. It is important that you put your used needles and syringes in a special sharps container. Do not put them in a trash can. If you do not have a sharps container, call your pharmacist or healthcare provider to get one. A special MedGuide will be given to you by the pharmacist with each prescription and refill. Be sure to read this information carefully each time. Talk to your pediatrician regarding the use of this medicine in children. While this medicine may be prescribed for children as young as 8 years for selected conditions, precautions do apply. Overdosage: If you think you have taken too much of this medicine contact a poison control center or emergency room at once. NOTE: This medicine is only for you. Do not share this medicine with others. What if I miss a dose? If you miss  a dose, take it as soon as you can. If it is almost time for your next dose, take only that dose. Do not take double or extra doses. What may interact with this medication? Do not take this medicine with any of the following medications: chasteberry cisapride dronedarone pimozide thioridazine This medicine may also interact with the following medications: herbal or dietary supplements, like black cohosh or DHEA female hormones, like estrogens or progestins and birth control pills, patches, rings, or injections female hormones, like testosterone other medicines that prolong the QT interval (abnormal heart rhythm) This list may not describe all possible interactions. Give your health care provider a list of all the medicines, herbs, non-prescription drugs, or dietary supplements you use. Also tell them if you smoke, drink alcohol, or use illegal drugs. Some items may interact with your medicine. What should I watch for while using this medication? Visit your doctor or health care professional for regular checks on your progress. During the first week, your symptoms may get worse, but then will improve as you continue your treatment. You may get hot flashes, increased bone pain, increased difficulty passing urine, or an aggravation of nerve symptoms. Discuss these effects with your doctor or health care professional, some of them may improve with continued use of this medicine. Female patients may experience a menstrual cycle or spotting during the first 2 months of therapy with this medicine. If this continues, contact your doctor or health care professional. This medicine may increase blood sugar. Ask your healthcare provider if changes in diet or medicines are needed if you have  diabetes. What side effects may I notice from receiving this medication? Side effects that you should report to your doctor or health care professional as soon as possible: allergic reactions like skin rash, itching or  hives, swelling of the face, lips, or tongue breathing problems chest pain depression or memory disorders pain in your legs or groin pain at site where injected severe headache signs and symptoms of high blood sugar such as being more thirsty or hungry or having to urinate more than normal. You may also feel very tired or have blurry vision swelling of the feet and legs visual changes vomiting Side effects that usually do not require medical attention (report to your doctor or health care professional if they continue or are bothersome): breast swelling or tenderness decrease in sex drive or performance diarrhea hot flashes loss of appetite muscle, joint, or bone pains nausea redness or irritation at site where injected skin problems or acne This list may not describe all possible side effects. Call your doctor for medical advice about side effects. You may report side effects to FDA at 1-800-FDA-1088. Where should I keep my medication? Keep out of the reach of children. Store below 25 degrees C (77 degrees F). Do not freeze. Protect from light. Do not use if it is not clear or if there are particles present. Throw away any unused medicine after the expiration date. NOTE: This sheet is a summary. It may not cover all possible information. If you have questions about this medicine, talk to your doctor, pharmacist, or health care provider.  2023 Elsevier/Gold Standard (2020-12-11 00:00:00)  

## 2021-08-03 ENCOUNTER — Encounter (HOSPITAL_COMMUNITY): Payer: Self-pay

## 2021-09-02 ENCOUNTER — Ambulatory Visit (HOSPITAL_COMMUNITY): Payer: PRIVATE HEALTH INSURANCE | Admitting: Hematology

## 2021-09-02 ENCOUNTER — Other Ambulatory Visit (HOSPITAL_COMMUNITY): Payer: PRIVATE HEALTH INSURANCE

## 2021-09-20 ENCOUNTER — Ambulatory Visit: Payer: PRIVATE HEALTH INSURANCE | Attending: Hematology

## 2021-09-20 VITALS — Wt 147.4 lb

## 2021-09-20 DIAGNOSIS — Z483 Aftercare following surgery for neoplasm: Secondary | ICD-10-CM | POA: Insufficient documentation

## 2021-09-20 NOTE — Therapy (Signed)
OUTPATIENT PHYSICAL THERAPY SOZO SCREENING NOTE   Patient Name: Lauren Ortega MRN: [id number] DOB:[birth year], 41 y.o., female Today's Date: 09/20/2021  PCP: [name], FNP REFERRING PROVIDER: Derek Jack, MD   PT End of Session - 09/20/21 1600     Visit Number 7   # unchanged due to screen only   PT Start Time 1558    PT Stop Time 1602    PT Time Calculation (min) 4 min    Activity Tolerance Patient tolerated treatment well    Behavior During Therapy [facility] for tasks assessed/performed             Past Medical History:  Diagnosis Date   Anemia    Anxiety    Breast cancer ([place]) 03/2020   Family history of colon cancer    Family history of lymphoma    Family history of pancreatic cancer    Family history of skin cancer    Heart murmur    Hypertension    Lumbar degenerative disc disease    Neuropathy due to chemotherapeutic drug ([place])    Port-A-Cath in place 06/16/2020   Past Surgical History:  Procedure Laterality Date   BREAST LUMPECTOMY WITH RADIOACTIVE SEED AND SENTINEL LYMPH NODE BIOPSY Right 05/20/2020   Procedure: RIGHT BREAST LUMPECTOMY WITH RADIOACTIVE SEED AND SENTINEL LYMPH NODE BIOPSY;  Surgeon: Coralie Keens, MD;  Location: [place];  Service: General;  Laterality: Right;   CESAREAN SECTION     x3   PORT-A-CATH REMOVAL Left 06/19/2021   Procedure: REMOVAL PORT-A-CATH;  Surgeon: Jesusita Oka, MD;  Location: [place];  Service: General;  Laterality: Left;   PORTACATH PLACEMENT Left 05/20/2020   Procedure: INSERTION PORT-A-CATH;  Surgeon: Coralie Keens, MD;  Location: [place];  Service: General;  Laterality: Left;   TONSILLECTOMY     TUBAL LIGATION     Patient Active Problem List   Diagnosis Date Noted   Cellulitis 06/19/2021   HTN (hypertension) 06/19/2021   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 ([place]) 06/19/2021   Anxiety and depression 06/19/2021   Iron  deficiency anemia 05/04/2021   Port-A-Cath in place 06/16/2020   Genetic testing 05/12/2020   Family history of pancreatic cancer    Family history of skin cancer    Family history of lymphoma    Family history of colon cancer    Malignant neoplasm of upper-outer quadrant of right female breast ([place]) 04/16/2020    REFERRING DIAG: right breast cancer at risk for lymphedema  THERAPY DIAG: Aftercare following surgery for neoplasm  PERTINENT HISTORY: 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 Ninfa Linden, HTN, DDD lumbar spine, Developed multiple bloodclots in LUE due to portacath which are now resolved.  Swelling from bloodclots has not resolved.   PRECAUTIONS: right UE Lymphedema risk, None  SUBJECTIVE: Pt returns for her 3 month L-Dex screen.   PAIN:  Are you having pain? No  SOZO SCREENING: Patient was assessed today using the SOZO machine to determine the lymphedema index score. This was compared to her baseline score. It was determined that she is within the recommended range when compared to her baseline and no further action is needed at this time. She will continue SOZO screenings. These are done every 3 months for 2 years post operatively followed by every 6 months for 2 years, and then annually.   L-DEX FLOWSHEETS - 09/20/21 1600  L-DEX LYMPHEDEMA SCREENING   Measurement Type Unilateral    L-DEX MEASUREMENT EXTREMITY Upper Extremity    POSITION  Standing    DOMINANT SIDE Right    At Risk Side Right    BASELINE SCORE (UNILATERAL) 0.1    L-DEX SCORE (UNILATERAL) -2.6    VALUE CHANGE (UNILAT) -2.7              Otelia Limes, PTA 09/20/2021, 4:01 PM

## 2021-10-12 ENCOUNTER — Other Ambulatory Visit (HOSPITAL_COMMUNITY): Payer: Self-pay | Admitting: Hematology

## 2021-10-12 ENCOUNTER — Ambulatory Visit (HOSPITAL_COMMUNITY)
Admission: RE | Admit: 2021-10-12 | Discharge: 2021-10-12 | Disposition: A | Discharge status: Home / Self Care | Payer: PRIVATE HEALTH INSURANCE | Source: Ambulatory Visit | Attending: Hematology | Admitting: Hematology

## 2021-10-12 ENCOUNTER — Other Ambulatory Visit: Payer: Self-pay | Admitting: Physician Assistant

## 2021-10-12 ENCOUNTER — Inpatient Hospital Stay: Payer: PRIVATE HEALTH INSURANCE | Attending: Hematology

## 2021-10-12 DIAGNOSIS — K769 Liver disease, unspecified: Secondary | ICD-10-CM | POA: Insufficient documentation

## 2021-10-12 DIAGNOSIS — Z79899 Other long term (current) drug therapy: Secondary | ICD-10-CM | POA: Insufficient documentation

## 2021-10-12 DIAGNOSIS — G629 Polyneuropathy, unspecified: Secondary | ICD-10-CM | POA: Diagnosis not present

## 2021-10-12 DIAGNOSIS — T80212S Local infection due to central venous catheter, sequela: Secondary | ICD-10-CM

## 2021-10-12 DIAGNOSIS — R232 Flushing: Secondary | ICD-10-CM | POA: Insufficient documentation

## 2021-10-12 DIAGNOSIS — M8580 Other specified disorders of bone density and structure, unspecified site: Secondary | ICD-10-CM | POA: Insufficient documentation

## 2021-10-12 DIAGNOSIS — Z7901 Long term (current) use of anticoagulants: Secondary | ICD-10-CM | POA: Insufficient documentation

## 2021-10-12 DIAGNOSIS — M5430 Sciatica, unspecified side: Secondary | ICD-10-CM | POA: Insufficient documentation

## 2021-10-12 DIAGNOSIS — C50411 Malignant neoplasm of upper-outer quadrant of right female breast: Secondary | ICD-10-CM | POA: Insufficient documentation

## 2021-10-12 DIAGNOSIS — Z79811 Long term (current) use of aromatase inhibitors: Secondary | ICD-10-CM | POA: Diagnosis not present

## 2021-10-12 DIAGNOSIS — Z86718 Personal history of other venous thrombosis and embolism: Secondary | ICD-10-CM | POA: Diagnosis not present

## 2021-10-12 LAB — COMPREHENSIVE METABOLIC PANEL
ALT: 28 U/L (ref 0–44)
AST: 27 U/L (ref 15–41)
Albumin: 4.4 g/dL (ref 3.5–5.0)
Alkaline Phosphatase: 93 U/L (ref 38–126)
Anion gap: 8 (ref 5–15)
BUN: 19 mg/dL (ref 6–20)
CO2: 27 mmol/L (ref 22–32)
Calcium: 10 mg/dL (ref 8.9–10.3)
Chloride: 105 mmol/L (ref 98–111)
Creatinine, Ser: 0.71 mg/dL (ref 0.44–1.00)
GFR, Estimated: >60 mL/min (ref >60)
Glucose, Bld: 111 mg/dL — ABNORMAL HIGH (ref 70–99)
Potassium: 3.9 mmol/L (ref 3.5–5.1)
Sodium: 140 mmol/L (ref 135–145)
Total Bilirubin: 0.8 mg/dL (ref 0.3–1.2)
Total Protein: 8.1 g/dL (ref 6.5–8.1)

## 2021-10-12 LAB — CBC WITH DIFFERENTIAL/PLATELET
Abs Immature Granulocytes: 0.03 10*3/uL (ref 0.00–0.07)
Basophils Absolute: 0.1 10*3/uL (ref 0.0–0.1)
Basophils Relative: 1 %
Eosinophils Absolute: 0.2 10*3/uL (ref 0.0–0.5)
Eosinophils Relative: 3 %
HCT: 40.2 % (ref 36.0–46.0)
Hemoglobin: 14 g/dL (ref 12.0–15.0)
Immature Granulocytes: 1 %
Lymphocytes Relative: 17 %
Lymphs Abs: 0.9 10*3/uL (ref 0.7–4.0)
MCH: 33 pg (ref 26.0–34.0)
MCHC: 34.8 g/dL (ref 30.0–36.0)
MCV: 94.8 fL (ref 80.0–100.0)
Monocytes Absolute: 0.5 10*3/uL (ref 0.1–1.0)
Monocytes Relative: 9 %
Neutro Abs: 3.7 10*3/uL (ref 1.7–7.7)
Neutrophils Relative %: 69 %
Platelets: 360 10*3/uL (ref 150–400)
RBC: 4.24 MIL/uL (ref 3.87–5.11)
RDW: 12.7 % (ref 11.5–15.5)
WBC: 5.3 10*3/uL (ref 4.0–10.5)
nRBC: 0 % (ref 0.0–0.2)

## 2021-10-12 LAB — VITAMIN D 25 HYDROXY (VIT D DEFICIENCY, FRACTURES): Vit D, 25-Hydroxy: 67.42 ng/mL (ref 30–100)

## 2021-10-12 LAB — IRON AND TIBC
Iron: 154 ug/dL (ref 28–170)
Saturation Ratios: 32 % — ABNORMAL HIGH (ref 10.4–31.8)
TIBC: 475 ug/dL — ABNORMAL HIGH (ref 250–450)
UIBC: 321 ug/dL

## 2021-10-12 LAB — MAGNESIUM: Magnesium: 1.8 mg/dL (ref 1.7–2.4)

## 2021-10-12 LAB — FERRITIN: Ferritin: 98 ng/mL (ref 11–307)

## 2021-10-12 MED ORDER — GADOPICLENOL 0.5 MMOL/ML IV SOLN
10.0000 mL | Freq: Once | INTRAVENOUS | Status: AC | PRN
  Administered 2021-10-12: 10 mL via INTRAVENOUS

## 2021-10-12 NOTE — Progress Notes (Signed)
Patient briefly evaluated today due to complaints of nonhealing wound at site of prior port.  Port was removed in May 2023 due to cellulitis.  Patient reports that wound never completely healed and that the sutures have been working their way to the surface one by one.  She reports intermittent pus-like drainage, but denies any erythema or fevers.  No obvious fluctuance or drainage on exam.  We will check ultrasound to rule out residual infection/abscess.

## 2021-10-13 LAB — CANCER ANTIGEN 27.29: CA 27.29: 10.3 U/mL (ref 0.0–38.6)

## 2021-10-13 NOTE — Telephone Encounter (Signed)
Lauren Ortega, if the pus has not come out yet and she can swing by this afternoon, can you do a culture of the pus-like fluid and send it down to the lab?

## 2021-10-13 NOTE — Telephone Encounter (Signed)
Spoke with patient and she is coming now.

## 2021-10-14 LAB — CANCER ANTIGEN 15-3: CA 15-3: 8.4 U/mL (ref 0.0–25.0)

## 2021-10-20 ENCOUNTER — Inpatient Hospital Stay (HOSPITAL_BASED_OUTPATIENT_CLINIC_OR_DEPARTMENT_OTHER): Payer: PRIVATE HEALTH INSURANCE | Admitting: Hematology

## 2021-10-20 ENCOUNTER — Ambulatory Visit: Payer: PRIVATE HEALTH INSURANCE

## 2021-10-20 ENCOUNTER — Ambulatory Visit (HOSPITAL_COMMUNITY)
Admission: RE | Admit: 2021-10-20 | Discharge: 2021-10-20 | Disposition: A | Discharge status: Home / Self Care | Payer: PRIVATE HEALTH INSURANCE | Source: Ambulatory Visit | Attending: Physician Assistant | Admitting: Physician Assistant

## 2021-10-20 ENCOUNTER — Inpatient Hospital Stay: Payer: PRIVATE HEALTH INSURANCE

## 2021-10-20 ENCOUNTER — Encounter: Payer: Self-pay | Admitting: Hematology

## 2021-10-20 VITALS — BP 124/94 | HR 96 | Temp 98.5°F | Resp 19 | Ht 60.0 in | Wt 143.7 lb

## 2021-10-20 DIAGNOSIS — K769 Liver disease, unspecified: Secondary | ICD-10-CM | POA: Diagnosis not present

## 2021-10-20 DIAGNOSIS — T80212S Local infection due to central venous catheter, sequela: Secondary | ICD-10-CM

## 2021-10-20 DIAGNOSIS — M8580 Other specified disorders of bone density and structure, unspecified site: Secondary | ICD-10-CM | POA: Diagnosis not present

## 2021-10-20 DIAGNOSIS — Z170 Estrogen receptor positive status [ER+]: Secondary | ICD-10-CM | POA: Diagnosis not present

## 2021-10-20 DIAGNOSIS — D509 Iron deficiency anemia, unspecified: Secondary | ICD-10-CM

## 2021-10-20 DIAGNOSIS — C50411 Malignant neoplasm of upper-outer quadrant of right female breast: Secondary | ICD-10-CM | POA: Diagnosis not present

## 2021-10-20 MED ORDER — METHYLPREDNISOLONE NA SUC (PF) 125 MG IJ SOLR: 80.0000 mg | Freq: Once | INTRAMUSCULAR | Status: DC

## 2021-10-20 MED ORDER — METHYLPREDNISOLONE SODIUM SUCC 125 MG IJ SOLR
80.0000 mg | Freq: Once | INTRAMUSCULAR | Status: AC
  Administered 2021-10-20: 80 mg via INTRAMUSCULAR
  Filled 2021-10-20: qty 2

## 2021-10-20 MED ORDER — LEUPROLIDE ACETATE (3 MONTH) 11.25 MG IM KIT
11.2500 mg | PACK | Freq: Once | INTRAMUSCULAR | Status: AC
  Administered 2021-10-20: 11.25 mg via INTRAMUSCULAR
  Filled 2021-10-20: qty 11.25

## 2021-10-20 NOTE — Progress Notes (Signed)
[facility] [address], [place] [address]   Patient Care Team: [name], FNP as PCP - General (Family Medicine) Brien Mates, RN as Oncology Nurse Navigator (Oncology)  SUMMARY OF ONCOLOGIC HISTORY: Oncology History  Malignant neoplasm of upper-outer quadrant of right female breast ([place])  04/16/2020 Initial Diagnosis   Malignant neoplasm of upper-outer quadrant of right female breast [facility])    Genetic Testing   Negative genetic testing. No pathogenic variants identified on the [employer] Multi-Cancer Panel+RNA. The report date is 05/10/2020.  The Multi-Cancer Panel + RNA offered by [facility] includes sequencing and/or deletion duplication testing of the following 84 genes: AIP, ALK, APC, ATM, AXIN2,BAP1,  BARD1, BLM, BMPR1A, BRCA1, BRCA2, BRIP1, CASR, CDC73, CDH1, CDK4, CDKN1B, CDKN1C, CDKN2A (p14ARF), CDKN2A (p16INK4a), CEBPA, CHEK2, CTNNA1, DICER1, DIS3L2, EGFR (c.2369C>T, p.Thr790Met variant only), EPCAM (Deletion/duplication testing only), FH, FLCN, GATA2, GPC3, GREM1 (Promoter region deletion/duplication testing only), HOXB13 (c.251G>A, p.Gly84Glu), HRAS, KIT, MAX, MEN1, MET, MITF (c.952G>A, p.Glu318Lys variant only), MLH1, MSH2, MSH3, MSH6, MUTYH, NBN, NF1, NF2, NTHL1, PALB2, PDGFRA, PHOX2B, PMS2, POLD1, POLE, POT1, PRKAR1A, PTCH1, PTEN, RAD50, RAD51C, RAD51D, RB1, RECQL4, RET, RUNX1, SDHAF2, SDHA (sequence changes only), SDHB, SDHC, SDHD, SMAD4, SMARCA4, SMARCB1, SMARCE1, STK11, SUFU, TERC, TERT, TMEM127, TP53, TSC1, TSC2, VHL, WRN and WT1.   06/23/2020 - 05/26/2021 Chemotherapy   Patient is on Treatment Plan : BREAST Docetaxel + Carboplatin + Trastuzumab (TCH) q21d / Trastuzumab q21d       CHIEF COMPLIANT: Follow-up for right breast cancer   INTERVAL HISTORY: [name] is a 41 y.o. female here for follow-up of right breast cancer.  She is tolerating anastrozole very well.  She reports hot flashes.  Energy levels are 100%.  She reports left sided  sciatic pain.  This pain is significant and is not improving.  She recently had MRI of the abdomen done to follow-up on the liver lesion.  She also had some redness in the port removal site on the left chest wall in the last few days.  We have ordered ultrasound of the chest soft tissue which was done today.  Denies any fevers or chills.  REVIEW OF SYSTEMS:   Review of Systems  Constitutional:  Negative for appetite change and fatigue.  Endocrine: Negative for hot flashes.  Psychiatric/Behavioral:  Positive for sleep disturbance.   All other systems reviewed and are negative.   I have reviewed the past medical history, past surgical history, social history and family history with the patient and they are unchanged from previous note.   ALLERGIES:   has No Known Allergies.   MEDICATIONS:  Current Outpatient Medications  Medication Sig Dispense Refill   acetaminophen (TYLENOL) 500 MG tablet Take 2 tablets (1,000 mg total) by mouth 4 (four) times daily. 120 tablet 3   amLODipine (NORVASC) 10 MG tablet Take 10 mg by mouth daily.     anastrozole (ARIMIDEX) 1 MG tablet Take 1 tablet (1 mg total) by mouth daily. 30 tablet 6   baclofen (LIORESAL) 20 MG tablet Take 20 mg by mouth 3 (three) times daily as needed (back spasms).     diphenoxylate-atropine (LOMOTIL) 2.5-0.025 MG tablet Take 2 tablets by mouth 4 (four) times daily as needed for diarrhea or loose stools. 30 tablet 3   DULoxetine (CYMBALTA) 30 MG capsule Take 1 capsule (30 mg total) by mouth 2 (two) times daily. (Patient taking differently: Take 60 mg by mouth daily.) 180 capsule 1   FERREX 150 150 MG capsule  Take 1 capsule (150 mg total) by mouth daily. 90 capsule 3   folic acid (FOLVITE) 1 MG tablet Take 1 mg by mouth daily.     ibuprofen (ADVIL) 600 MG tablet Take 1 tablet (600 mg total) by mouth 4 (four) times daily. 120 tablet 1   magnesium oxide (MAG-OX) 400 (240 Mg) MG tablet TAKE 1 TABLET BY MOUTH TWICE A DAY IN THE MORNING AND  IN THE EVENING 60 tablet 5   oxyCODONE (OXY IR/ROXICODONE) 5 MG immediate release tablet Take 1 tablet (5 mg total) by mouth every 4 (four) hours as needed for severe pain. 15 tablet 0   spironolactone (ALDACTONE) 50 MG tablet Take 50 mg by mouth every morning.     temazepam (RESTORIL) 15 MG capsule Take 1 capsule (15 mg total) by mouth at bedtime as needed for sleep. 30 capsule 2   trastuzumab in sodium chloride 0.9 % 250 mL Inject into the vein every 21 ( twenty-one) days.     vitamin B-12 (CYANOCOBALAMIN) 1000 MCG tablet Take 1 tablet (1,000 mcg total) by mouth daily. 90 tablet 3   No current facility-administered medications for this visit.     PHYSICAL EXAMINATION: Performance status (ECOG): 0 - Asymptomatic  There were no vitals filed for this visit.  Wt Readings from Last 3 Encounters:  09/20/21 147 lb 6 oz (66.8 kg)  07/12/21 142 lb 3.2 oz (64.5 kg)  07/05/21 141 lb 9.6 oz (64.2 kg)   Physical Exam Vitals reviewed.  Constitutional:      Appearance: Normal appearance.  Cardiovascular:     Rate and Rhythm: Normal rate and regular rhythm.     Pulses: Normal pulses.     Heart sounds: Normal heart sounds.  Pulmonary:     Effort: Pulmonary effort is normal.     Breath sounds: Normal breath sounds.  Neurological:     General: No focal deficit present.     Mental Status: She is alert and oriented to person, place, and time.  Psychiatric:        Mood and Affect: Mood normal.        Behavior: Behavior normal.    Breast Exam Chaperone: Thana Ates     LABORATORY DATA:  I have reviewed the data as listed    Latest Ref Rng & Units 10/12/2021    8:40 AM 06/19/2021    5:10 AM 05/26/2021   11:54 AM  CMP  Glucose 70 - 99 mg/dL 111  104  138   BUN 6 - 20 mg/dL _0 Creatinine 0.44 - 1.00 mg/dL 0.71  0.80  0.78   Sodium 135 - 145 mmol/L 140  136  136   Potassium 3.5 - 5.1 mmol/L 3.9  3.8  3.7   Chloride 98 - 111 mmol/L 105  102  103   CO2 22 - 32 mmol/L _1 Calcium 8.9 - 10.3 mg/dL 10.0  9.3  9.2   Total Protein 6.5 - 8.1 g/dL 8.1  7.2  7.1   Total Bilirubin 0.3 - 1.2 mg/dL 0.8  0.2  0.4   Alkaline Phos 38 - 126 U/L 93  106  85   AST 15 - 41 U/L 27  28  32   ALT 0 - 44 U/L 28  30  34    Lab Results  Component Value Date   CAN153 8.4 10/12/2021   Lab Results  Component Value Date  WBC 5.3 10/12/2021   HGB 14.0 10/12/2021   HCT 40.2 10/12/2021   MCV 94.8 10/12/2021   PLT 360 10/12/2021   NEUTROABS 3.7 10/12/2021    ASSESSMENT:  1.  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  She had mammogram/ultrasound on 03/18/2020 done in [place] which showed a 1.4 x 0.9 x 1.5 cm mass in the upper outer quadrant of the right breast. -Biopsy on 03/31/2020 consistent with intermediate to high-grade infiltrating ductal carcinoma of the right breast at 10:30 position, HER-2 3+ positive, Ki-67 40%, ER weak staining in less than 10% of tumor cells, PR negative. -MRI of the breast on 04/20/2020 with 1.3 x 1.5 x 1.4 cm irregular enhancing mass within the upper outer right breast.  No abnormal appearing lymph nodes. - Right breast lumpectomy and SLNB on 05/20/2020- grade 3 IDC, 1.8 cm, invasive carcinoma is less than 1 mm from anterior margin focally and less than 1 mm from the posterior margin broadly.  Margins negative for in situ carcinoma.  Lymphovascular invasion present.  0/5 lymph nodes positive.  There is focal ductal carcinoma within the vascular space in the soft tissue adjacent to the lymph node.  No carcinoma is seen within the lymph nodes.  PT1CPN0. - Genetic testing was negative. - 6 cycles of Strawberry from 06/23/2020 through 10/07/2020. - XRT to the breast, 20 fractions completed on 01/15/2021 with Dr. Barnetta Chapel in [place]. - Tamoxifen was discontinued secondary to leg pains. - Estradiol and FSH levels were in the postmenopausal range.  She did not have any  manses since the start of chemotherapy in June 2022. - Anastrozole started on 03/24/2021.   2.  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  Never smoker. -Maternal grandmother had colon cancer.  Maternal grandfather died of non-Hodgkin's lymphoma.  Paternal grandfather died of lung cancer.  Maternal uncle had multiple myeloma.  Mother had basal cell skin cancer.  3.  Left upper extremity DVT: - Doppler on 07/06/2020 positive for acute DVT in the left axillary, peripheral subclavian vein and superficial thrombosis involving left basilic vein. - This is port induced and she was started on Eliquis.   PLAN:  1.  Stage I (T1 cN0 M0) right breast IDC, HER2 positive: - Mammogram on 03/30/2021: BI-RADS Category 2. - She did not have any menses since we started her on Lupron. - She reports hot flashes which are stable. - Continue anastrozole daily.  Reviewed labs today which showed normal LFTs.  CBC was normal.  Tumor markers CA 15-3 and CA 27-29 were normal. - RTC 6 months for follow-up.   2.  Left upper extremity DVT: - Eliquis was discontinued after the port removed.  3.  Osteopenia: - DEXA scan on 06/16/2021 with T score -1.1. - Vitamin D level was 67.  Continue calcium and vitamin D supplements twice daily.  4.  Peripheral neuropathy: - Continue Cymbalta 60 mg daily.  5.  Hepatic lesion: - MRI of the abdomen on 10/12/2021: Right hepatic lobe lesion reduced in size compared to 07/01/2021.  Nonspecific appearance, consistent with hyalinized hemangioma.  Given the history of breast cancer, surveillance imaging with MRI was recommended in 6 to 12 months. - Tumor markers are within normal limits.  LFTs are normal. - Recommend repeat MRI of the abdomen with and without contrast in 6 months.  6.  Left-sided sciatic pain: -  She reports severe sciatic pain which is not improving. - We will give her Solu-Medrol 80 mg IM today. - We will make  referral to Dr. Elonda Husky in [place] for injection.  Patient reportedly had injection on the right side which worked very well.  Breast Cancer therapy associated bone loss: I have recommended calcium, Vitamin D and weight bearing exercises.  Orders placed this encounter:  No orders of the defined types were placed in this encounter.   The patient has a good understanding of the overall plan. She agrees with it. She will call with any problems that may develop before the next visit here.  Derek Jack, MD [facility] [phone]

## 2021-10-20 NOTE — Patient Instructions (Addendum)
[facility]  Discharge Instructions  You were seen and examined today by Dr. Delton Coombes.  Your MRI was fine. There is no infection at the port site, either.  You will get Lupron as planned today.  Follow-up as scheduled.  Thank you for choosing [facility] to provide your oncology and hematology care.   To afford each patient quality time with our provider, please arrive at least 15 minutes before your scheduled appointment time. You may need to reschedule your appointment if you arrive late (10 or more minutes). Arriving late affects you and other patients whose appointments are after yours.  Also, if you miss three or more appointments without notifying the office, you may be dismissed from the clinic at the provider's discretion.    Again, thank you for choosing [facility].  Our hope is that these requests will decrease the amount of time that you wait before being seen by our physicians.   If you have a lab appointment with the [facility] please come in thru the Main Entrance and check in at the main information desk.           _____________________________________________________________  Should you have questions after your visit to [facility], please contact our office at [phone] and follow the prompts.  Our office hours are 8:00 a.m. to 4:30 p.m. Monday - Thursday and 8:00 a.m. to 2:30 p.m. Friday.  Please note that voicemails left after 4:00 p.m. may not be returned until the following business day.  We are closed weekends and all major holidays.  You do have access to a nurse 24-7, just call the main number to the clinic [phone] and do not press any options, hold on the line and a nurse will answer the phone.    For prescription refill requests, have your pharmacy contact our office and allow 72 hours.    Masks are optional in the cancer centers. If you would like for your care team  to wear a mask while they are taking care of you, please let them know. You may have one support person who is at least 43 years old accompany you for your appointments.

## 2021-10-20 NOTE — Patient Instructions (Signed)
[place] [facility]  Discharge Instructions: Thank you for choosing [facility] to provide your oncology and hematology care.  If you have a lab appointment with the [facility], please come in thru the Main Entrance and check in at the main information desk.  Wear comfortable clothing and clothing appropriate for easy access to any Portacath or PICC line.   We strive to give you quality time with your provider. You may need to reschedule your appointment if you arrive late (15 or more minutes).  Arriving late affects you and other patients whose appointments are after yours.  Also, if you miss three or more appointments without notifying the office, you may be dismissed from the clinic at the provider's discretion.      For prescription refill requests, have your pharmacy contact our office and allow 72 hours for refills to be completed.    Today you received the following chemotherapy and/or immunotherapy agents Lupron injection. Leuprolide Solution for Injection What is this medication? LEUPROLIDE (loo PROE lide) reduces the symptoms of prostate cancer. It works by decreasing levels of the hormone testosterone in the body. This prevents prostate cancer cells from spreading or growing. This medicine may be used for other purposes; ask your health care provider or pharmacist if you have questions. COMMON BRAND NAME(S): Lupron What should I tell my care team before I take this medication? They need to know if you have any of these conditions: Diabetes Heart attack Heart disease High blood pressure High cholesterol Pain or difficulty passing urine Spinal cord metastasis Stroke Tobacco use An unusual or allergic reaction to leuprolide, other medications, foods, dyes, or preservatives Pregnant or trying to get pregnant Breast-feeding How should I use this medication? This medication is for injection under the skin or into a muscle. You will be taught how to  prepare and give this medication. Use exactly as directed. Take your medication at regular intervals. Do not take it more often than directed. It is important that you put your used needles and syringes in a special sharps container. Do not put them in a trash can. If you do not have a sharps container, call your care team to get one. A special MedGuide will be given to you by the pharmacist with each prescription and refill. Be sure to read this information carefully each time. Talk to your care team about the use of this medication in children. While this medication may be prescribed for children as young as 8 years for selected conditions, precautions do apply. Overdosage: If you think you have taken too much of this medicine contact a poison control center or emergency room at once. NOTE: This medicine is only for you. Do not share this medicine with others. What if I miss a dose? If you miss a dose, take it as soon as you can. If it is almost time for your next dose, take only that dose. Do not take double or extra doses. What may interact with this medication? Do not take this medication with any of the following: Chasteberry Cisapride Dronedarone Pimozide Thioridazine This medication may also interact with the following: Estrogen or progestin hormones Herbal or dietary supplements, like black cohosh or DHEA Other medications that cause heart rhythm changes Testosterone This list may not describe all possible interactions. Give your health care provider a list of all the medicines, herbs, non-prescription drugs, or dietary supplements you use. Also tell them if you smoke, drink alcohol, or use illegal drugs.  Some items may interact with your medicine. What should I watch for while using this medication? Visit your care team for regular checks on your progress. During the first week, your symptoms may get worse, but then will improve as you continue your treatment. You may get hot flashes,  increased bone pain, increased difficulty passing urine, or an aggravation of nerve symptoms. Discuss these effects with your care team, some of them may improve with continued use of this medication. Patients may experience a menstrual cycle or spotting during the first 2 months of therapy with this medication. If this continues, contact your care team. This medication may increase blood sugar. The risk may be higher in patients who already have diabetes. Ask your care team what you can do to lower your risk of diabetes while taking this medication. What side effects may I notice from receiving this medication? Side effects that you should report to your care team as soon as possible: Allergic reactions--skin rash, itching, hives, swelling of the face, lips, tongue, or throat Heart attack--pain or tightness in the chest, shoulders, arms, or jaw, nausea, shortness of breath, cold or clammy skin, feeling faint or lightheaded Heart rhythm changes--fast or irregular heartbeat, dizziness, feeling faint or lightheaded, chest pain, trouble breathing High blood sugar (hyperglycemia)--increased thirst or amount of urine, unusual weakness or fatigue, blurry vision Mood swings, irritability, hostility Seizures Stroke--sudden numbness or weakness of the face, arm, or leg, trouble speaking, confusion, trouble walking, loss of balance or coordination, dizziness, severe headache, change in vision Thoughts of suicide or self-harm, worsening mood, feelings of depression Side effects that usually do not require medical attention (report to your care team if they continue or are bothersome): Bone pain Change in sex drive or performance General discomfort and fatigue Hot flashes Muscle pain Pain, redness, or irritation at injection site Swelling of the ankles, hands, or feet This list may not describe all possible side effects. Call your doctor for medical advice about side effects. You may report side effects to  FDA at 1-800-FDA-1088. Where should I keep my medication? Keep out of the reach of children and pets. Store below 25 degrees C (77 degrees F). Do not freeze. Protect from light. Get rid of any unused medication after the expiration date. To get rid of medications that are no longer needed or have expired: Take the medication to a medication take-back program. Check with your pharmacy or law enforcement to find a location. If you cannot return the medication, ask your pharmacist or care team how to get rid of this medication safely. NOTE: This sheet is a summary. It may not cover all possible information. If you have questions about this medicine, talk to your doctor, pharmacist, or health care provider.  2023 Elsevier/Gold Standard (2020-12-11 00:00:00)       To help prevent nausea and vomiting after your treatment, we encourage you to take your nausea medication as directed.  BELOW ARE SYMPTOMS THAT SHOULD BE REPORTED IMMEDIATELY: *FEVER GREATER THAN 100.4 F (38 C) OR HIGHER *CHILLS OR SWEATING *NAUSEA AND VOMITING THAT IS NOT CONTROLLED WITH YOUR NAUSEA MEDICATION *UNUSUAL SHORTNESS OF BREATH *UNUSUAL BRUISING OR BLEEDING *URINARY PROBLEMS (pain or burning when urinating, or frequent urination) *BOWEL PROBLEMS (unusual diarrhea, constipation, pain near the anus) TENDERNESS IN MOUTH AND THROAT WITH OR WITHOUT PRESENCE OF ULCERS (sore throat, sores in mouth, or a toothache) UNUSUAL RASH, SWELLING OR PAIN  UNUSUAL VAGINAL DISCHARGE OR ITCHING   Items with * indicate a potential emergency and  should be followed up as soon as possible or go to the Emergency Department if any problems should occur.  Please show the CHEMOTHERAPY ALERT CARD or IMMUNOTHERAPY ALERT CARD at check-in to the Emergency Department and triage nurse.  Should you have questions after your visit or need to cancel or reschedule your appointment, please contact [facility] [phone]  and follow  the prompts.  Office hours are 8:00 a.m. to 4:30 p.m. Monday - Friday. Please note that voicemails left after 4:00 p.m. may not be returned until the following business day.  We are closed weekends and major holidays. You have access to a nurse at all times for urgent questions. Please call the main number to the clinic [phone] and follow the prompts.  For any non-urgent questions, you may also contact your provider using MyChart. We now offer e-Visits for anyone 27 and older to request care online for non-urgent symptoms. For details visit [email or web address].   Also download the MyChart app! Go to the app store, search "MyChart", open the app, select [facility], and log in with your MyChart username and password.  Masks are optional in the cancer centers. If you would like for your care team to wear a mask while they are taking care of you, please let them know. You may have one support person who is at least 43 years old accompany you for your appointments.

## 2021-10-20 NOTE — Progress Notes (Signed)
Patient presents today for Lupron injection and follow up visit with Dr. Delton Coombes. Vital signs stable. Patient has complaints of pain in the left hip that radiates down leg. Sharp, shooting down to the foot and toes tingle per patients words. Patient rates the pain a 10 / 10.   Message received from Sylvester Harder RN / Dr. Delton Coombes  proceed with Lupron injection today. Give 80 mg Solumedrol IM now for pain.   Lauren Ortega presents today for injection per the provider's orders.  Lupron administration without incident; injection site WNL; see MAR for injection details.  Patient tolerated procedure well and without incident.  No questions or complaints noted at this time.

## 2021-10-20 NOTE — Progress Notes (Signed)
Patient has been assessed, vital signs and labs have been reviewed by Dr. Delton Coombes. ANC, Creatinine, LFTs, and Platelets are within treatment parameters per Dr. Delton Coombes. The patient is good to proceed with treatment at this time. Patient to receive '80mg'$  SoluMedrol IM per Dr. Delton Coombes for pain. Primary RN and pharmacy aware.

## 2021-10-21 ENCOUNTER — Encounter: Payer: Self-pay | Admitting: Lab

## 2021-10-21 NOTE — Progress Notes (Unsigned)
Referral sent to Dr Storm Frisk.  Records faxed on 9/28

## 2021-10-22 ENCOUNTER — Other Ambulatory Visit: Payer: Self-pay

## 2021-10-22 MED ORDER — ESTRADIOL 0.1 MG/GM VA CREA: 1.0000 | TOPICAL_CREAM | Freq: Two times a day (BID) | VAGINAL | 12 refills | Status: DC

## 2021-10-28 ENCOUNTER — Other Ambulatory Visit: Payer: Self-pay | Admitting: *Deleted

## 2021-10-28 MED ORDER — ESTRADIOL 0.1 MG/GM VA CREA: 1.0000 | TOPICAL_CREAM | Freq: Two times a day (BID) | VAGINAL | 12 refills | Status: DC

## 2021-10-28 MED ORDER — ANASTROZOLE 1 MG PO TABS: 1.0000 mg | ORAL_TABLET | Freq: Every day | ORAL | 6 refills | Status: DC

## 2021-11-01 ENCOUNTER — Other Ambulatory Visit: Payer: Self-pay | Admitting: *Deleted

## 2021-11-01 MED ORDER — ESTRADIOL 0.1 MG/GM VA CREA: 1.0000 | TOPICAL_CREAM | Freq: Two times a day (BID) | VAGINAL | 12 refills | Status: DC

## 2021-11-01 MED ORDER — DULOXETINE HCL 30 MG PO CPEP: 30.0000 mg | ORAL_CAPSULE | Freq: Two times a day (BID) | ORAL | 1 refills | Status: DC

## 2022-01-03 ENCOUNTER — Ambulatory Visit: Payer: PRIVATE HEALTH INSURANCE | Attending: Hematology

## 2022-01-03 VITALS — Wt 142.2 lb

## 2022-01-03 DIAGNOSIS — Z483 Aftercare following surgery for neoplasm: Secondary | ICD-10-CM | POA: Insufficient documentation

## 2022-01-03 NOTE — Therapy (Signed)
OUTPATIENT PHYSICAL THERAPY SOZO SCREENING NOTE   Patient Name: Lauren Ortega MRN: [id number] DOB:[birth year], 41 y.o., female Today's Date: 01/03/2022  PCP: [name], FNP REFERRING PROVIDER: Derek Jack, MD   PT End of Session - 01/03/22 1106     Visit Number 7   # unchanged due to screen only   PT Start Time 1104    PT Stop Time 1108    PT Time Calculation (min) 4 min    Activity Tolerance Patient tolerated treatment well    Behavior During Therapy [facility] for tasks assessed/performed             Past Medical History:  Diagnosis Date   Anemia    Anxiety    Breast cancer ([place]) 03/2020   Family history of colon cancer    Family history of lymphoma    Family history of pancreatic cancer    Family history of skin cancer    Heart murmur    Hypertension    Lumbar degenerative disc disease    Neuropathy due to chemotherapeutic drug ([place])    Port-A-Cath in place 06/16/2020   Past Surgical History:  Procedure Laterality Date   BREAST LUMPECTOMY WITH RADIOACTIVE SEED AND SENTINEL LYMPH NODE BIOPSY Right 05/20/2020   Procedure: RIGHT BREAST LUMPECTOMY WITH RADIOACTIVE SEED AND SENTINEL LYMPH NODE BIOPSY;  Surgeon: Coralie Keens, MD;  Location: [facility];  Service: General;  Laterality: Right;   CESAREAN SECTION     x3   PORT-A-CATH REMOVAL Left 06/19/2021   Procedure: REMOVAL PORT-A-CATH;  Surgeon: Jesusita Oka, MD;  Location: [facility];  Service: General;  Laterality: Left;   PORTACATH PLACEMENT Left 05/20/2020   Procedure: INSERTION PORT-A-CATH;  Surgeon: Coralie Keens, MD;  Location: [facility];  Service: General;  Laterality: Left;   TONSILLECTOMY     TUBAL LIGATION     Patient Active Problem List   Diagnosis Date Noted   Cellulitis 06/19/2021   HTN (hypertension) 06/19/2021   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 ([place]) 06/19/2021   Anxiety and depression 06/19/2021   Iron  deficiency anemia 05/04/2021   Port-A-Cath in place 06/16/2020   Genetic testing 05/12/2020   Family history of pancreatic cancer    Family history of skin cancer    Family history of lymphoma    Family history of colon cancer    Malignant neoplasm of upper-outer quadrant of right female breast ([place]) 04/16/2020    REFERRING DIAG: right breast cancer at risk for lymphedema  THERAPY DIAG: Aftercare following surgery for neoplasm  PERTINENT HISTORY: 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 Ninfa Linden, HTN, DDD lumbar spine, Developed multiple bloodclots in LUE due to portacath which are now resolved.  Swelling from bloodclots has not resolved.   PRECAUTIONS: right UE Lymphedema risk, None  SUBJECTIVE: Pt returns for her 3 month L-Dex screen. "I did some physical therapy for my LBP I was having then had an MRI which showed stenosis. I'm getting injections soon for that."   PAIN:  Are you having pain? No  SOZO SCREENING: Patient was assessed today using the SOZO machine to determine the lymphedema index score. This was compared to her baseline score. It was determined that she is within the recommended range when compared to her baseline and no further action is needed at this time. She will continue SOZO screenings. These are done every 3  months for 2 years post operatively followed by every 6 months for 2 years, and then annually.   L-DEX FLOWSHEETS - 01/03/22 1100       L-DEX LYMPHEDEMA SCREENING   Measurement Type Unilateral    L-DEX MEASUREMENT EXTREMITY Upper Extremity    POSITION  Standing    DOMINANT SIDE Right    At Risk Side Right    BASELINE SCORE (UNILATERAL) 0.1    L-DEX SCORE (UNILATERAL) -2.8    VALUE CHANGE (UNILAT) -2.9              Otelia Limes, PTA 01/03/2022, 11:08 AM

## 2022-01-19 ENCOUNTER — Inpatient Hospital Stay: Payer: PRIVATE HEALTH INSURANCE

## 2022-01-21 ENCOUNTER — Inpatient Hospital Stay: Payer: PRIVATE HEALTH INSURANCE | Attending: Hematology

## 2022-01-21 VITALS — BP 126/84 | HR 84 | Temp 98.5°F | Resp 18 | Wt 145.9 lb

## 2022-01-21 DIAGNOSIS — Z86718 Personal history of other venous thrombosis and embolism: Secondary | ICD-10-CM | POA: Diagnosis not present

## 2022-01-21 DIAGNOSIS — D509 Iron deficiency anemia, unspecified: Secondary | ICD-10-CM

## 2022-01-21 DIAGNOSIS — M8580 Other specified disorders of bone density and structure, unspecified site: Secondary | ICD-10-CM | POA: Insufficient documentation

## 2022-01-21 DIAGNOSIS — Z79811 Long term (current) use of aromatase inhibitors: Secondary | ICD-10-CM | POA: Insufficient documentation

## 2022-01-21 DIAGNOSIS — Z5111 Encounter for antineoplastic chemotherapy: Secondary | ICD-10-CM | POA: Diagnosis present

## 2022-01-21 DIAGNOSIS — C50411 Malignant neoplasm of upper-outer quadrant of right female breast: Secondary | ICD-10-CM | POA: Insufficient documentation

## 2022-01-21 DIAGNOSIS — Z7901 Long term (current) use of anticoagulants: Secondary | ICD-10-CM | POA: Diagnosis not present

## 2022-01-21 DIAGNOSIS — Z170 Estrogen receptor positive status [ER+]: Secondary | ICD-10-CM | POA: Insufficient documentation

## 2022-01-21 MED ORDER — HEPARIN SOD (PORK) LOCK FLUSH 100 UNIT/ML IV SOLN: 500.0000 [IU] | Freq: Once | INTRAVENOUS | Status: DC

## 2022-01-21 MED ORDER — LEUPROLIDE ACETATE (3 MONTH) 11.25 MG IM KIT
11.2500 mg | PACK | Freq: Once | INTRAMUSCULAR | Status: AC
  Administered 2022-01-21: 11.25 mg via INTRAMUSCULAR
  Filled 2022-01-21: qty 11.25

## 2022-01-21 MED ORDER — SODIUM CHLORIDE 0.9% FLUSH: 10.0000 mL | Freq: Once | INTRAVENOUS | Status: DC

## 2022-01-21 NOTE — Progress Notes (Signed)
Patient presents today for Port flush and Lupron injection per providers order.   Vital signs WNL.  Patient has no new complaints at this time.  Patient no longer has a Port.  Lupron administration without incident; injection site WNL; see MAR for injection details.  Patient tolerated procedure well and without incident.  No questions or complaints noted at this time.

## 2022-01-21 NOTE — Patient Instructions (Signed)
[facility]  Discharge Instructions: Thank you for choosing [facility] to provide your oncology and hematology care.  If you have a lab appointment with the [facility], please come in thru the Main Entrance and check in at the main information desk.  Wear comfortable clothing and clothing appropriate for easy access to any Portacath or PICC line.   We strive to give you quality time with your provider. You may need to reschedule your appointment if you arrive late (15 or more minutes).  Arriving late affects you and other patients whose appointments are after yours.  Also, if you miss three or more appointments without notifying the office, you may be dismissed from the clinic at the provider's discretion.      For prescription refill requests, have your pharmacy contact our office and allow 72 hours for refills to be completed.    Today you received the following chemotherapy and/or immunotherapy agents Lupron      To help prevent nausea and vomiting after your treatment, we encourage you to take your nausea medication as directed.  BELOW ARE SYMPTOMS THAT SHOULD BE REPORTED IMMEDIATELY: *FEVER GREATER THAN 100.4 F (38 C) OR HIGHER *CHILLS OR SWEATING *NAUSEA AND VOMITING THAT IS NOT CONTROLLED WITH YOUR NAUSEA MEDICATION *UNUSUAL SHORTNESS OF BREATH *UNUSUAL BRUISING OR BLEEDING *URINARY PROBLEMS (pain or burning when urinating, or frequent urination) *BOWEL PROBLEMS (unusual diarrhea, constipation, pain near the anus) TENDERNESS IN MOUTH AND THROAT WITH OR WITHOUT PRESENCE OF ULCERS (sore throat, sores in mouth, or a toothache) UNUSUAL RASH, SWELLING OR PAIN  UNUSUAL VAGINAL DISCHARGE OR ITCHING   Items with * indicate a potential emergency and should be followed up as soon as possible or go to the Emergency Department if any problems should occur.  Please show the CHEMOTHERAPY ALERT CARD or IMMUNOTHERAPY ALERT CARD at check-in to the Emergency  Department and triage nurse.  Should you have questions after your visit or need to cancel or reschedule your appointment, please contact [facility] [phone]  and follow the prompts.  Office hours are 8:00 a.m. to 4:30 p.m. Monday - Friday. Please note that voicemails left after 4:00 p.m. may not be returned until the following business day.  We are closed weekends and major holidays. You have access to a nurse at all times for urgent questions. Please call the main number to the clinic [phone] and follow the prompts.  For any non-urgent questions, you may also contact your provider using MyChart. We now offer e-Visits for anyone 87 and older to request care online for non-urgent symptoms. For details visit [email or web address].   Also download the MyChart app! Go to the app store, search "MyChart", open the app, select [facility], and log in with your MyChart username and password.

## 2022-02-01 ENCOUNTER — Other Ambulatory Visit: Payer: Self-pay | Admitting: *Deleted

## 2022-02-01 DIAGNOSIS — M2550 Pain in unspecified joint: Secondary | ICD-10-CM

## 2022-02-08 ENCOUNTER — Other Ambulatory Visit (HOSPITAL_COMMUNITY): Payer: Self-pay | Admitting: Hematology

## 2022-02-08 DIAGNOSIS — Z9889 Other specified postprocedural states: Secondary | ICD-10-CM

## 2022-02-09 ENCOUNTER — Other Ambulatory Visit: Payer: Self-pay | Admitting: *Deleted

## 2022-02-09 MED ORDER — ESTRADIOL 0.1 MG/GM VA CREA: 1.0000 | TOPICAL_CREAM | Freq: Two times a day (BID) | VAGINAL | 12 refills | Status: DC

## 2022-02-14 ENCOUNTER — Other Ambulatory Visit (HOSPITAL_COMMUNITY): Payer: Self-pay | Admitting: *Deleted

## 2022-02-14 MED ORDER — FERREX 150 150 MG PO CAPS: 150.0000 mg | ORAL_CAPSULE | Freq: Every day | ORAL | 3 refills | Status: DC

## 2022-02-28 ENCOUNTER — Other Ambulatory Visit: Payer: Self-pay | Admitting: Hematology

## 2022-04-05 ENCOUNTER — Encounter (HOSPITAL_COMMUNITY): Payer: Self-pay

## 2022-04-05 ENCOUNTER — Ambulatory Visit (HOSPITAL_COMMUNITY)
Admission: RE | Admit: 2022-04-05 | Discharge: 2022-04-05 | Disposition: A | Discharge status: Home / Self Care | Payer: PRIVATE HEALTH INSURANCE | Source: Ambulatory Visit | Attending: Hematology | Admitting: Hematology

## 2022-04-05 DIAGNOSIS — Z9889 Other specified postprocedural states: Secondary | ICD-10-CM | POA: Diagnosis present

## 2022-04-20 ENCOUNTER — Encounter (HOSPITAL_COMMUNITY): Payer: Self-pay | Admitting: Radiology

## 2022-04-20 ENCOUNTER — Encounter (HOSPITAL_COMMUNITY): Payer: Self-pay | Admitting: Hematology

## 2022-04-20 ENCOUNTER — Inpatient Hospital Stay: Payer: PRIVATE HEALTH INSURANCE | Attending: Hematology

## 2022-04-20 ENCOUNTER — Ambulatory Visit (HOSPITAL_COMMUNITY)
Admission: RE | Admit: 2022-04-20 | Discharge: 2022-04-20 | Disposition: A | Discharge status: Home / Self Care | Payer: PRIVATE HEALTH INSURANCE | Source: Ambulatory Visit | Attending: Hematology | Admitting: Hematology

## 2022-04-20 DIAGNOSIS — D509 Iron deficiency anemia, unspecified: Secondary | ICD-10-CM | POA: Insufficient documentation

## 2022-04-20 DIAGNOSIS — K769 Liver disease, unspecified: Secondary | ICD-10-CM | POA: Diagnosis present

## 2022-04-20 DIAGNOSIS — C50411 Malignant neoplasm of upper-outer quadrant of right female breast: Secondary | ICD-10-CM | POA: Insufficient documentation

## 2022-04-20 DIAGNOSIS — Z170 Estrogen receptor positive status [ER+]: Secondary | ICD-10-CM | POA: Insufficient documentation

## 2022-04-20 DIAGNOSIS — M2550 Pain in unspecified joint: Secondary | ICD-10-CM | POA: Diagnosis present

## 2022-04-20 LAB — CBC WITH DIFFERENTIAL/PLATELET
Abs Immature Granulocytes: 0.02 10*3/uL (ref 0.00–0.07)
Basophils Absolute: 0.1 10*3/uL (ref 0.0–0.1)
Basophils Relative: 1 %
Eosinophils Absolute: 0.2 10*3/uL (ref 0.0–0.5)
Eosinophils Relative: 3 %
HCT: 39.6 % (ref 36.0–46.0)
Hemoglobin: 13.4 g/dL (ref 12.0–15.0)
Immature Granulocytes: 0 %
Lymphocytes Relative: 19 %
Lymphs Abs: 0.9 10*3/uL (ref 0.7–4.0)
MCH: 32.3 pg (ref 26.0–34.0)
MCHC: 33.8 g/dL (ref 30.0–36.0)
MCV: 95.4 fL (ref 80.0–100.0)
Monocytes Absolute: 0.6 10*3/uL (ref 0.1–1.0)
Monocytes Relative: 11 %
Neutro Abs: 3.3 10*3/uL (ref 1.7–7.7)
Neutrophils Relative %: 66 %
Platelets: 327 10*3/uL (ref 150–400)
RBC: 4.15 MIL/uL (ref 3.87–5.11)
RDW: 13.2 % (ref 11.5–15.5)
WBC: 5 10*3/uL (ref 4.0–10.5)
nRBC: 0 % (ref 0.0–0.2)

## 2022-04-20 LAB — COMPREHENSIVE METABOLIC PANEL
ALT: 19 U/L (ref 0–44)
AST: 20 U/L (ref 15–41)
Albumin: 4.2 g/dL (ref 3.5–5.0)
Alkaline Phosphatase: 93 U/L (ref 38–126)
Anion gap: 7 (ref 5–15)
BUN: 18 mg/dL (ref 6–20)
CO2: 28 mmol/L (ref 22–32)
Calcium: 9.3 mg/dL (ref 8.9–10.3)
Chloride: 101 mmol/L (ref 98–111)
Creatinine, Ser: 0.7 mg/dL (ref 0.44–1.00)
GFR, Estimated: >60 mL/min (ref >60)
Glucose, Bld: 95 mg/dL (ref 70–99)
Potassium: 4 mmol/L (ref 3.5–5.1)
Sodium: 136 mmol/L (ref 135–145)
Total Bilirubin: 0.7 mg/dL (ref 0.3–1.2)
Total Protein: 7.3 g/dL (ref 6.5–8.1)

## 2022-04-20 LAB — MAGNESIUM: Magnesium: 1.9 mg/dL (ref 1.7–2.4)

## 2022-04-20 LAB — SEDIMENTATION RATE: Sed Rate: 9 mm/hr (ref 0–22)

## 2022-04-20 LAB — IRON AND TIBC
Iron: 86 ug/dL (ref 28–170)
Saturation Ratios: 19 % (ref 10.4–31.8)
TIBC: 456 ug/dL — ABNORMAL HIGH (ref 250–450)
UIBC: 370 ug/dL

## 2022-04-20 LAB — FERRITIN: Ferritin: 44 ng/mL (ref 11–307)

## 2022-04-20 MED ORDER — GADOBUTROL 1 MMOL/ML IV SOLN
6.0000 mL | Freq: Once | INTRAVENOUS | Status: AC | PRN
  Administered 2022-04-20: 6 mL via INTRAVENOUS

## 2022-04-21 LAB — CANCER ANTIGEN 27.29: CA 27.29: <9 U/mL (ref 0.0–38.6)

## 2022-04-22 ENCOUNTER — Other Ambulatory Visit: Payer: Self-pay | Admitting: Hematology

## 2022-04-22 LAB — CANCER ANTIGEN 15-3: CA 15-3: 7.7 U/mL (ref 0.0–25.0)

## 2022-04-22 LAB — RHEUMATOID FACTOR: Rheumatoid fact SerPl-aCnc: 14.9 IU/mL — ABNORMAL HIGH (ref <14.0)

## 2022-04-26 NOTE — Progress Notes (Signed)
[facility] [address], [place] [address]    Clinic Day:  04/27/2022  Referring physician: Drema Halon, FNP  Patient Care Team: Drema Halon, FNP as PCP - General (Family Medicine) Therese Sarah, RN as Oncology Nurse Navigator (Oncology)   ASSESSMENT & PLAN:   Assessment: 1.  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  She had mammogram/ultrasound on 03/18/2020 done in [place] which showed a 1.4 x 0.9 x 1.5 cm mass in the upper outer quadrant of the right breast. -Biopsy on 03/31/2020 consistent with intermediate to high-grade infiltrating ductal carcinoma of the right breast at 10:30 position, HER-2 3+ positive, Ki-67 40%, ER weak staining in less than 10% of tumor cells, PR negative. -MRI of the breast on 04/20/2020 with 1.3 x 1.5 x 1.4 cm irregular enhancing mass within the upper outer right breast.  No abnormal appearing lymph nodes. - Right breast lumpectomy and SLNB on 05/20/2020- grade 3 IDC, 1.8 cm, invasive carcinoma is less than 1 mm from anterior margin focally and less than 1 mm from the posterior margin broadly.  Margins negative for in situ carcinoma.  Lymphovascular invasion present.  0/5 lymph nodes positive.  There is focal ductal carcinoma within the vascular space in the soft tissue adjacent to the lymph node.  No carcinoma is seen within the lymph nodes.  PT1CPN0. - Genetic testing was negative. - 6 cycles of TCH from 06/23/2020 through 10/07/2020. - XRT to the breast, 20 fractions completed on 01/15/2021 with Dr. Jannet Mantis in [place]. - Tamoxifen was discontinued secondary to leg pains. - Estradiol and FSH levels were in the postmenopausal range.  She did not have any manses since the start of chemotherapy in June 2022. - Anastrozole started on 03/24/2021.   2.  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  Never smoker. -Maternal grandmother had colon cancer.  Maternal grandfather died of non-Hodgkin's lymphoma.  Paternal grandfather died of lung cancer.  Maternal uncle had multiple myeloma.  Mother had basal cell skin cancer.  3.  Left upper extremity DVT: - Doppler on 07/06/2020 positive for acute DVT in the left axillary, peripheral subclavian vein and superficial thrombosis involving left basilic vein. - This is port induced and she was started on Eliquis.   Plan: 1.  Stage I (T1 cN0 M0) right breast IDC, HER2 positive: - Physical exam today shows lumpectomy scar in the right breast around the areola is well-healed with no palpable masses.  Mild lymphedema.  No other palpable masses or adenopathy on either side. - She is tolerating anastrozole reasonably well.  She reported hot flashes, worse at night.  She also reported decreased libido and elbow pains.  She reports her middle fingers in both hands lock. - Labs today: Tumor markers within normal limits.  LFTs normal.  CBC was normal. - Mammogram (04/05/2022): BI-RADS Category 2. - Proceed with Lupron today.  Continue anastrozole. - She wants to delay her next Lupron and not take anastrozole for 6 weeks. - RTC 6 months for follow-up.   2.  Hepatic lesion: - Previous MRI of the abdomen showed right hepatic lobe lesion. - Reviewed MRI from 04/20/2022: Slight interval decrease in size in the posterior inferior right lobe of liver measuring 2.6 x 2.0 cm with completely 3.3 x 2.6 cm.  No specific evidence of lymphadenopathy.  Differential diagnosis includes FNH/hepatic adenoma.  3.  Osteopenia: - DEXA scan on 06/16/2021 with T-score -1.1. - Continue calcium and vitamin D twice daily.  Last vitamin D level was 67.  4.  Peripheral neuropathy: - Continue Cymbalta 60 mg daily.  5.  Left-sided sciatic pain: - She was seen by Dr. Nelida Gores and an MRI of the back was done. - Injection did not work.  She will be referred to a  back surgeon.    Orders Placed This Encounter  Procedures   MM DIAG BREAST TOMO BILATERAL    Standing Status:   Future    Standing Expiration Date:   04/27/2023    Order Specific Question:   Reason for Exam (SYMPTOM  OR DIAGNOSIS REQUIRED)    Answer:   breast cancer screening; hx of breast cancer    Order Specific Question:   Is the patient pregnant?    Answer:   No    Order Specific Question:   Preferred imaging location?    Answer:   [facility]   CBC with Differential    Standing Status:   Future    Standing Expiration Date:   04/27/2023   Comprehensive metabolic panel    Standing Status:   Future    Standing Expiration Date:   04/27/2023   Cancer antigen 15-3    Standing Status:   Future    Standing Expiration Date:   04/27/2023   Cancer antigen 27.29    Standing Status:   Future    Standing Expiration Date:   04/27/2023   Ferritin    Standing Status:   Future    Standing Expiration Date:   04/27/2023   Iron and TIBC ([facility] ONLY)    Standing Status:   Future    Standing Expiration Date:   04/27/2023      I,Lauren Ortega,acting as a scribe for Doreatha Massed, MD.,have documented all relevant documentation on the behalf of Doreatha Massed, MD,as directed by  Doreatha Massed, MD while in the presence of Doreatha Massed, MD.   I, Doreatha Massed MD, have reviewed the above documentation for accuracy and completeness, and I agree with the above.   Doreatha Massed, MD   4/3/20245:50 PM  CHIEF COMPLAINT:   Diagnosis: right breast cancer    Cancer Staging  Malignant neoplasm of upper-outer quadrant of right female breast Staging form: Breast, AJCC 8th Edition - Clinical stage from 04/21/2020: Stage IA (cT1c, cN0, cM0, G3, ER+, PR-, HER2+) - Unsigned    Prior Therapy: 1. Right breast lumpectomy and SLNB on 05/20/2020  2. 6 cycles of TCH from 06/23/2020 through 10/07/2020.  3. XRT to the breast, 20 fractions completed on  01/15/2021   Current Therapy:  anastrozole and Lupron   HISTORY OF PRESENT ILLNESS:   Oncology History  Malignant neoplasm of upper-outer quadrant of right female breast  04/16/2020 Initial Diagnosis   Malignant neoplasm of upper-outer quadrant of right female breast [facility])    Genetic Testing   Negative genetic testing. No pathogenic variants identified on the [facility] Multi-Cancer Panel+RNA. The report date is 05/10/2020.  The Multi-Cancer Panel + RNA offered by [facility] includes sequencing and/or deletion duplication testing of the following 84 genes: AIP, ALK, APC, ATM, AXIN2,BAP1,  BARD1, BLM, BMPR1A, BRCA1, BRCA2, BRIP1, CASR, CDC73, CDH1, CDK4, CDKN1B, CDKN1C, CDKN2A (p14ARF), CDKN2A (p16INK4a), CEBPA, CHEK2, CTNNA1, DICER1, DIS3L2, EGFR (c.2369C>T, p.Thr790Met variant only), EPCAM (Deletion/duplication testing only), FH, FLCN, GATA2, GPC3, GREM1 (Promoter region deletion/duplication testing only), HOXB13 (c.251G>A,  p.Gly84Glu), HRAS, KIT, MAX, MEN1, MET, MITF (c.952G>A, p.Glu318Lys variant only), MLH1, MSH2, MSH3, MSH6, MUTYH, NBN, NF1, NF2, NTHL1, PALB2, PDGFRA, PHOX2B, PMS2, POLD1, POLE, POT1, PRKAR1A, PTCH1, PTEN, RAD50, RAD51C, RAD51D, RB1, RECQL4, RET, RUNX1, SDHAF2, SDHA (sequence changes only), SDHB, SDHC, SDHD, SMAD4, SMARCA4, SMARCB1, SMARCE1, STK11, SUFU, TERC, TERT, TMEM127, TP53, TSC1, TSC2, VHL, WRN and WT1.   06/23/2020 - 05/26/2021 Chemotherapy   Patient is on Treatment Plan : BREAST Docetaxel + Carboplatin + Trastuzumab (TCH) q21d / Trastuzumab q21d        INTERVAL HISTORY:   Kascha is a 42 y.o. female presenting to clinic today for follow up of right breast cancer. She was last seen by me on 10/20/21.  Since her last visit, she underwent routine diagnostic mammogram on 04/05/22 showing stable lumpectomy changes, no evidence of malignancy bilaterally.  She also underwent abdomen MRI on 04/20/22 to follow up on previously-seen liver lesion. 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  Of note, she also underwent lumbar spine MRI on 12/07/21 showing: multilevel degenerative changes with most severe findings being severe bilateral neuroforamina narrowing and spinal stenosis at L4-L5.  Today, she states that she is doing well overall. Her appetite level is at 100%. Her energy level is at 50%.  PAST MEDICAL HISTORY:   Past Medical History: Past Medical History:  Diagnosis Date   Anemia    Anxiety    Breast cancer (HCC) 03/2020   IDC grade 3 neg margins   Family history of colon cancer    Family history of lymphoma    Family history of pancreatic cancer    Family history of skin cancer    Heart murmur    Hypertension    Lumbar degenerative disc disease    Neuropathy due to chemotherapeutic drug (HCC)    Port-A-Cath in place 06/16/2020    Surgical History: Past Surgical History:  Procedure Laterality Date   BREAST LUMPECTOMY WITH RADIOACTIVE SEED AND SENTINEL LYMPH NODE BIOPSY Right 05/20/2020   Procedure: RIGHT BREAST LUMPECTOMY WITH RADIOACTIVE SEED AND SENTINEL LYMPH NODE BIOPSY;  Surgeon: Abigail Miyamoto, MD;  Location: [facility];  Service: General;  Laterality: Right;   CESAREAN SECTION     x3   PORT-A-CATH REMOVAL Left 06/19/2021   Procedure: REMOVAL PORT-A-CATH;  Surgeon: Diamantina Monks, MD;  Location: [facility];  Service: General;  Laterality: Left;   PORTACATH PLACEMENT Left 05/20/2020   Procedure: INSERTION PORT-A-CATH;  Surgeon: Abigail Miyamoto, MD;  Location: [facility];  Service: General;  Laterality: Left;   TONSILLECTOMY     TUBAL LIGATION      Social History: Social History   Socioeconomic History   Marital status: Divorced    Spouse name: Not on file   Number of children: Not on file    Years of education: Not on file   Highest education level: Not on file  Occupational History   Not on file  Tobacco Use   Smoking status: Never   Smokeless tobacco: Never  Substance and Sexual Activity   Alcohol use: Yes    Comment: occassionally   Drug use: Never   Sexual activity: Yes    Birth control/protection: Surgical  Other Topics Concern   Not on file  Social History Narrative   Not on file   Social Determinants  of Health   Financial Resource Strain: Low Risk  (04/21/2020)   Overall Financial Resource Strain (CARDIA)    Difficulty of Paying Living Expenses: Not hard at all  Food Insecurity: No Food Insecurity (04/21/2020)   Hunger Vital Sign    Worried About Running Out of Food in the Last Year: Never true    Ran Out of Food in the Last Year: Never true  Transportation Needs: No Transportation Needs (04/21/2020)   PRAPARE - Administrator, Civil Service (Medical): No    Lack of Transportation (Non-Medical): No  Physical Activity: Insufficiently Active (04/21/2020)   Exercise Vital Sign    Days of Exercise per Week: 2 days    Minutes of Exercise per Session: 20 min  Stress: No Stress Concern Present (04/21/2020)   [employer] of Occupational Health - Occupational Stress Questionnaire    Feeling of Stress : Not at all  Social Connections: Moderately Integrated (04/21/2020)   Social Connection and Isolation Panel [NHANES]    Frequency of Communication with Friends and Family: More than three times a week    Frequency of Social Gatherings with Friends and Family: More than three times a week    Attends Religious Services: 1 to 4 times per year    Active Member of [employer] or Organizations: No    Attends Engineer, structural: 1 to 4 times per year    Marital Status: Divorced  Catering manager Violence: Not At Risk (04/21/2020)   Humiliation, Afraid, Rape, and Kick questionnaire    Fear of Current or Ex-Partner: No    Emotionally Abused: No     Physically Abused: No    Sexually Abused: No    Family History: Family History  Problem Relation Age of Onset   Basal cell carcinoma Mother 19   Bladder Cancer Maternal Uncle 72   Lymphoma Maternal Uncle 57       Waldenstroms    Pancreatic cancer Maternal Grandmother    Non-Hodgkin's lymphoma Maternal Grandfather    Lung cancer Paternal Grandfather    Colon cancer Other        x8 or 9   Colon cancer Maternal Great-grandfather     Current Medications:  Current Outpatient Medications:    acetaminophen (TYLENOL) 500 MG tablet, Take 2 tablets (1,000 mg total) by mouth 4 (four) times daily., Disp: 120 tablet, Rfl: 3   amLODipine (NORVASC) 10 MG tablet, Take 10 mg by mouth daily., Disp: , Rfl:    anastrozole (ARIMIDEX) 1 MG tablet, Take 1 tablet (1 mg total) by mouth daily., Disp: 30 tablet, Rfl: 6   cyanocobalamin (VITAMIN B12) 1000 MCG tablet, TAKE 1 TABLET BY MOUTH EVERY DAY, Disp: 30 tablet, Rfl: 5   DULoxetine (CYMBALTA) 30 MG capsule, Take 1 capsule (30 mg total) by mouth 2 (two) times daily., Disp: 180 capsule, Rfl: 1   estradiol (ESTRACE VAGINAL) 0.1 MG/GM vaginal cream, Place 1 Applicatorful vaginally 2 (two) times daily. Twice daily for two weeks, then twice weekly., Disp: 170 g, Rfl: 12   folic acid (FOLVITE) 1 MG tablet, Take 1 mg by mouth daily., Disp: , Rfl:    ibuprofen (ADVIL) 600 MG tablet, Take 1 tablet (600 mg total) by mouth 4 (four) times daily., Disp: 120 tablet, Rfl: 1   magnesium oxide (MAG-OX) 400 (240 Mg) MG tablet, TAKE 1 TABLET BY MOUTH TWICE A DAY IN THE MORNING AND IN THE EVENING, Disp: 180 tablet, Rfl: 2   spironolactone (ALDACTONE) 50 MG tablet, Take  50 mg by mouth every morning., Disp: , Rfl:    Allergies: No Known Allergies  REVIEW OF SYSTEMS:   Review of Systems  Constitutional:  Negative for chills, fatigue and fever.  HENT:   Negative for lump/mass, mouth sores, nosebleeds, sore throat and trouble swallowing.   Eyes:  Negative for eye problems.   Respiratory:  Negative for cough and shortness of breath.   Cardiovascular:  Negative for chest pain, leg swelling and palpitations.  Gastrointestinal:  Negative for abdominal pain, constipation, diarrhea, nausea and vomiting.  Endocrine: Positive for hot flashes.  Genitourinary:  Negative for bladder incontinence, difficulty urinating, dysuria, frequency, hematuria and nocturia.   Musculoskeletal:  Negative for arthralgias (Hands), back pain, flank pain, myalgias and neck pain.  Skin:  Negative for itching and rash.  Neurological:  Negative for dizziness, headaches and numbness.  Hematological:  Does not bruise/bleed easily.  Psychiatric/Behavioral:  Negative for depression, sleep disturbance and suicidal ideas. The patient is not nervous/anxious.   All other systems reviewed and are negative.    VITALS:   Blood pressure 127/78, pulse 85, temperature 98.5 F (36.9 C), temperature source Oral, resp. rate 16, weight 149 lb 3.2 oz (67.7 kg), last menstrual period 05/17/2020, SpO2 97 %.  Wt Readings from Last 3 Encounters:  04/27/22 149 lb 3.2 oz (67.7 kg)  01/21/22 145 lb 14.4 oz (66.2 kg)  01/03/22 142 lb 4 oz (64.5 kg)    Body mass index is 29.14 kg/m.  Performance status (ECOG): 0 - Asymptomatic  PHYSICAL EXAM:   Physical Exam Vitals and nursing note reviewed. Exam conducted with a chaperone present.  Constitutional:      Appearance: Normal appearance.  Cardiovascular:     Rate and Rhythm: Normal rate and regular rhythm.     Pulses: Normal pulses.     Heart sounds: Normal heart sounds.  Pulmonary:     Effort: Pulmonary effort is normal.     Breath sounds: Normal breath sounds.  Abdominal:     Palpations: Abdomen is soft. There is no hepatomegaly, splenomegaly or mass.     Tenderness: There is no abdominal tenderness.  Musculoskeletal:     Right lower leg: No edema.     Left lower leg: No edema.  Lymphadenopathy:     Cervical: No cervical adenopathy.     Right  cervical: No superficial, deep or posterior cervical adenopathy.    Left cervical: No superficial, deep or posterior cervical adenopathy.     Upper Body:     Right upper body: No supraclavicular or axillary adenopathy.     Left upper body: No supraclavicular or axillary adenopathy.  Neurological:     General: No focal deficit present.     Mental Status: She is alert and oriented to person, place, and time.  Psychiatric:        Mood and Affect: Mood normal.        Behavior: Behavior normal.     LABS:      Latest Ref Rng & Units 04/20/2022   10:11 AM 10/12/2021    8:40 AM 06/19/2021    5:10 AM  CBC  WBC 4.0 - 10.5 K/uL 5.0  5.3  10.6   Hemoglobin 12.0 - 15.0 g/dL 65.7  84.6  96.2   Hematocrit 36.0 - 46.0 % 39.6  40.2  39.5   Platelets 150 - 400 K/uL 327  360  320       Latest Ref Rng & Units 04/20/2022   10:11 AM 10/12/2021  8:40 AM 06/19/2021    5:10 AM  CMP  Glucose 70 - 99 mg/dL 95  409  811   BUN 6 - 20 mg/dL 18  19  16    Creatinine 0.44 - 1.00 mg/dL 9.14  7.82  9.56   Sodium 135 - 145 mmol/L 136  140  136   Potassium 3.5 - 5.1 mmol/L 4.0  3.9  3.8   Chloride 98 - 111 mmol/L 101  105  102   CO2 22 - 32 mmol/L 28  27  28    Calcium 8.9 - 10.3 mg/dL 9.3  21.3  9.3   Total Protein 6.5 - 8.1 g/dL 7.3  8.1  7.2   Total Bilirubin 0.3 - 1.2 mg/dL 0.7  0.8  0.2   Alkaline Phos 38 - 126 U/L 93  93  106   AST 15 - 41 U/L 20  27  28    ALT 0 - 44 U/L 19  28  30       No results found for: "CEA1", "CEA" / No results found for: "CEA1", "CEA" No results found for: "PSA1" No results found for: "YQM578" No results found for: "CAN125"  No results found for: "TOTALPROTELP", "ALBUMINELP", "A1GS", "A2GS", "BETS", "BETA2SER", "GAMS", "MSPIKE", "SPEI" Lab Results  Component Value Date   TIBC 456 (H) 04/20/2022   TIBC 475 (H) 10/12/2021   TIBC 445 04/14/2021   FERRITIN 44 04/20/2022   FERRITIN 98 10/12/2021   FERRITIN 19 04/14/2021   IRONPCTSAT 19 04/20/2022   IRONPCTSAT 32 (H)  10/12/2021   IRONPCTSAT 17 04/14/2021   No results found for: "LDH"   STUDIES:   MR Abdomen W Wo Contrast  Result Date: 04/20/2022 CLINICAL DATA:  Follow-up liver lesion, history of breast cancer EXAM: MRI ABDOMEN WITHOUT AND WITH CONTRAST TECHNIQUE: Multiplanar multisequence MR imaging of the abdomen was performed both before and after the administration of intravenous contrast. CONTRAST:  6mL GADAVIST GADOBUTROL 1 MMOL/ML IV SOLN COMPARISON:  07/01/2021, 10/12/2021 FINDINGS: Lower chest: No acute abnormality. Hepatobiliary: Lesion is again seen in the posterior inferior right lobe of the liver, hepatic segment VI. On today's examination, this lesion is well-circumscribed and faintly T2 hyperintense with subtle hypercellularity on diffusion-weighted imaging, measuring 2.6 x 2.0 cm, slightly diminished in size, previously 3.3 x 2.6 cm (series 7, image 16, series 15, image 72). On multiphasic contrast enhanced imaging, this demonstrates significant early arterial hyperenhancement, with fade to parenchymal background on multiphasic imaging. This pattern of contrast enhancement and underlying T2 hyperintensity is significantly different than that seen on prior examinations, which demonstrated a much more heterogeneous intrinsic signal and contrast enhancement. No gallstones, gallbladder wall thickening, or biliary dilatation. Pancreas: Unremarkable. No pancreatic ductal dilatation or surrounding inflammatory changes. Spleen: Normal in size without significant abnormality. Adrenals/Urinary Tract: Adrenal glands are unremarkable. Kidneys are normal, without renal calculi, solid lesion, or hydronephrosis. Stomach/Bowel: Stomach is within normal limits. No evidence of bowel wall thickening, distention, or inflammatory changes. Vascular/Lymphatic: No significant vascular findings are present. No enlarged abdominal lymph nodes. Other: No abdominal wall hernia or abnormality. No ascites. Musculoskeletal: No acute or  significant osseous findings. IMPRESSION: 1. Slight interval decrease in size of lesion in the posterior inferior right lobe of the liver, hepatic segment VI, now measuring 2.6 x 2.0 cm, previously 3.3 x 2.6 cm. This lesion demonstrates significant early arterial hyperenhancement, with fade to parenchymal background on multiphasic imaging. Pattern of contrast enhancement and underlying T2 hyperintensity is significantly different than that seen on prior examinations,  which demonstrated a much more heterogeneous intrinsic signal and contrast enhancement. 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 Specifically, appearance is not particularly suggestive of a metastasis. In the setting of known primary malignancy, recommend continued surveillance in 6 months by Eovist enhanced MRI. 2. No specific evidence of lymphadenopathy or evidence of metastatic disease in the abdomen. Electronically Signed   By: Jearld Lesch M.D.   On: 04/20/2022 17:08   MM DIAG BREAST TOMO BILATERAL  Result Date: 04/05/2022 CLINICAL DATA:  42 year old female with history right breast cancer post lumpectomy April 2022. EXAM: DIGITAL DIAGNOSTIC BILATERAL MAMMOGRAM WITH TOMOSYNTHESIS TECHNIQUE: Bilateral digital diagnostic mammography and breast tomosynthesis was performed. COMPARISON:  Previous exam(s). ACR Breast Density Category b: There are scattered areas of fibroglandular density. FINDINGS: No suspicious masses or calcifications are seen in either breast. Lumpectomy changes again identified in upper-outer right breast. Spot compression magnification CC view of the right breast lumpectomy site was performed. There is no mammographic evidence of locally recurrent malignancy. IMPRESSION: Stable lumpectomy changes in the right breast. No mammographic evidence of malignancy in either  breast. RECOMMENDATION: 1.  Screening mammogram in one year.(Code:SM-B-01Y) 2. The patient is now two or more years post lumpectomy and therefore may return to annual screening mammography, however given history of breast cancer the patient does remain eligible for annual diagnostic mammography if indicated. I have discussed the findings and recommendations with the patient. If applicable, a reminder letter will be sent to the patient regarding the next appointment. BI-RADS CATEGORY  2: Benign. Electronically Signed   By: Edwin Cap M.D.   On: 04/05/2022 10:28

## 2022-04-27 ENCOUNTER — Inpatient Hospital Stay: Payer: PRIVATE HEALTH INSURANCE

## 2022-04-27 ENCOUNTER — Inpatient Hospital Stay: Payer: PRIVATE HEALTH INSURANCE | Attending: Hematology | Admitting: Hematology

## 2022-04-27 VITALS — BP 127/78 | HR 85 | Temp 98.5°F | Resp 16 | Wt 149.2 lb

## 2022-04-27 DIAGNOSIS — D509 Iron deficiency anemia, unspecified: Secondary | ICD-10-CM

## 2022-04-27 DIAGNOSIS — Z79811 Long term (current) use of aromatase inhibitors: Secondary | ICD-10-CM | POA: Diagnosis not present

## 2022-04-27 DIAGNOSIS — C50411 Malignant neoplasm of upper-outer quadrant of right female breast: Secondary | ICD-10-CM

## 2022-04-27 DIAGNOSIS — Z79899 Other long term (current) drug therapy: Secondary | ICD-10-CM | POA: Insufficient documentation

## 2022-04-27 DIAGNOSIS — Z170 Estrogen receptor positive status [ER+]: Secondary | ICD-10-CM | POA: Diagnosis not present

## 2022-04-27 DIAGNOSIS — K769 Liver disease, unspecified: Secondary | ICD-10-CM

## 2022-04-27 MED ORDER — LEUPROLIDE ACETATE (3 MONTH) 11.25 MG IM KIT
11.2500 mg | PACK | Freq: Once | INTRAMUSCULAR | Status: AC
  Administered 2022-04-27: 11.25 mg via INTRAMUSCULAR
  Filled 2022-04-27: qty 11.25

## 2022-04-27 NOTE — Patient Instructions (Signed)
[facility]  Discharge Instructions: Thank you for choosing [facility] to provide your oncology and hematology care.  If you have a lab appointment with the [facility] - please note that after April 8th, 2024, all labs will be drawn in the cancer center.  You do not have to check in or register with the main entrance as you have in the past but will complete your check-in in the cancer center.  Wear comfortable clothing and clothing appropriate for easy access to any Portacath or PICC line.   We strive to give you quality time with your provider. You may need to reschedule your appointment if you arrive late (15 or more minutes).  Arriving late affects you and other patients whose appointments are after yours.  Also, if you miss three or more appointments without notifying the office, you may be dismissed from the clinic at the provider's discretion.      For prescription refill requests, have your pharmacy contact our office and allow 72 hours for refills to be completed.    Today you received the following chemotherapy and/or immunotherapy agents Lupron      To help prevent nausea and vomiting after your treatment, we encourage you to take your nausea medication as directed.  BELOW ARE SYMPTOMS THAT SHOULD BE REPORTED IMMEDIATELY: *FEVER GREATER THAN 100.4 F (38 C) OR HIGHER *CHILLS OR SWEATING *NAUSEA AND VOMITING THAT IS NOT CONTROLLED WITH YOUR NAUSEA MEDICATION *UNUSUAL SHORTNESS OF BREATH *UNUSUAL BRUISING OR BLEEDING *URINARY PROBLEMS (pain or burning when urinating, or frequent urination) *BOWEL PROBLEMS (unusual diarrhea, constipation, pain near the anus) TENDERNESS IN MOUTH AND THROAT WITH OR WITHOUT PRESENCE OF ULCERS (sore throat, sores in mouth, or a toothache) UNUSUAL RASH, SWELLING OR PAIN  UNUSUAL VAGINAL DISCHARGE OR ITCHING   Items with * indicate a potential emergency and should be followed up as soon as possible or go to the  Emergency Department if any problems should occur.  Please show the CHEMOTHERAPY ALERT CARD or IMMUNOTHERAPY ALERT CARD at check-in to the Emergency Department and triage nurse.  Should you have questions after your visit or need to cancel or reschedule your appointment, please contact [facility] [phone]  and follow the prompts.  Office hours are 8:00 a.m. to 4:30 p.m. Monday - Friday. Please note that voicemails left after 4:00 p.m. may not be returned until the following business day.  We are closed weekends and major holidays. You have access to a nurse at all times for urgent questions. Please call the main number to the clinic [phone] and follow the prompts.  For any non-urgent questions, you may also contact your provider using MyChart. We now offer e-Visits for anyone 74 and older to request care online for non-urgent symptoms. For details visit [email or web address].   Also download the MyChart app! Go to the app store, search "MyChart", open the app, select [facility], and log in with your MyChart username and password.

## 2022-04-27 NOTE — Progress Notes (Signed)
Patient presents today for Lupron injection per providers order.  Vital signs and labs reviewed by MD.  Message received from Lauren Champion RN/Dr. Delton Coombes patient okay for injection.  Stable during administration without incident; injection site WNL; see MAR for injection details.  Patient tolerated procedure well and without incident.  No questions or complaints noted at this time.

## 2022-04-27 NOTE — Patient Instructions (Addendum)
[facility] at [facility] Discharge Instructions   You were seen and examined today by Dr. Delton Coombes.  He reviewed the results of your lab work which are normal/stable.   He reviewed the results of your mammogram which is normal.   He reviewed the results of your MRI which was normal/better. This spot is favored to be a benign lesion.   Continue anastrozole daily as prescribed.  Return as scheduled in 6 months. We will repeat lab work and an MRI prior to your next visit.    Thank you for choosing [facility] at [facility] to provide your oncology and hematology care.  To afford each patient quality time with our provider, please arrive at least 15 minutes before your scheduled appointment time.   If you have a lab appointment with the [facility] please come in thru the Main Entrance and check in at the main information desk.  You need to re-schedule your appointment should you arrive 10 or more minutes late.  We strive to give you quality time with our providers, and arriving late affects you and other patients whose appointments are after yours.  Also, if you no show three or more times for appointments you may be dismissed from the clinic at the providers discretion.     Again, thank you for choosing [facility].  Our hope is that these requests will decrease the amount of time that you wait before being seen by our physicians.       _____________________________________________________________  Should you have questions after your visit to [facility], please contact our office at [phone] and follow the prompts.  Our office hours are 8:00 a.m. and 4:30 p.m. Monday - Friday.  Please note that voicemails left after 4:00 p.m. may not be returned until the following business day.  We are closed weekends and major holidays.  You do have access to a nurse 24-7, just call the main number to the clinic [phone]  and do not press any options, hold on the line and a nurse will answer the phone.    For prescription refill requests, have your pharmacy contact our office and allow 72 hours.    Due to Covid, you will need to wear a mask upon entering the hospital. If you do not have a mask, a mask will be given to you at the Main Entrance upon arrival. For doctor visits, patients may have 1 support person age 2 or older with them. For treatment visits, patients can not have anyone with them due to social distancing guidelines and our immunocompromised population.

## 2022-05-29 ENCOUNTER — Other Ambulatory Visit: Payer: Self-pay | Admitting: Hematology

## 2022-05-30 ENCOUNTER — Encounter (HOSPITAL_COMMUNITY): Payer: Self-pay | Admitting: Hematology

## 2022-05-30 ENCOUNTER — Other Ambulatory Visit: Payer: Self-pay | Admitting: *Deleted

## 2022-05-30 NOTE — Telephone Encounter (Signed)
Anastrozole refill approved.  Patient is tolerating and is to continue therapy.  

## 2022-07-18 ENCOUNTER — Ambulatory Visit: Payer: PRIVATE HEALTH INSURANCE | Attending: Hematology

## 2022-07-18 VITALS — Wt 150.2 lb

## 2022-07-18 DIAGNOSIS — Z483 Aftercare following surgery for neoplasm: Secondary | ICD-10-CM | POA: Insufficient documentation

## 2022-07-18 NOTE — Therapy (Signed)
OUTPATIENT PHYSICAL THERAPY SOZO SCREENING NOTE   Patient Name: Lauren Ortega MRN: [id number] DOB:1980/08/12, 42 y.o., female Today's Date: 07/18/2022  PCP: Drema Halon, FNP REFERRING PROVIDER: Doreatha Massed, MD   PT End of Session - 07/18/22 1605     Visit Number 7   # unchanged due to screen only   PT Start Time 1602    PT Stop Time 1606    PT Time Calculation (min) 4 min    Activity Tolerance Patient tolerated treatment well    Behavior During Therapy [facility] for tasks assessed/performed             Past Medical History:  Diagnosis Date   Anemia    Anxiety    Breast cancer (HCC) 03/2020   IDC grade 3 neg margins   Family history of colon cancer    Family history of lymphoma    Family history of pancreatic cancer    Family history of skin cancer    Heart murmur    Hypertension    Lumbar degenerative disc disease    Neuropathy due to chemotherapeutic drug (HCC)    Port-A-Cath in place 06/16/2020   Past Surgical History:  Procedure Laterality Date   BREAST LUMPECTOMY WITH RADIOACTIVE SEED AND SENTINEL LYMPH NODE BIOPSY Right 05/20/2020   Procedure: RIGHT BREAST LUMPECTOMY WITH RADIOACTIVE SEED AND SENTINEL LYMPH NODE BIOPSY;  Surgeon: Abigail Miyamoto, MD;  Location: [facility];  Service: General;  Laterality: Right;   CESAREAN SECTION     x3   PORT-A-CATH REMOVAL Left 06/19/2021   Procedure: REMOVAL PORT-A-CATH;  Surgeon: Diamantina Monks, MD;  Location: [facility] OR;  Service: General;  Laterality: Left;   PORTACATH PLACEMENT Left 05/20/2020   Procedure: INSERTION PORT-A-CATH;  Surgeon: Abigail Miyamoto, MD;  Location: [facility];  Service: General;  Laterality: Left;   TONSILLECTOMY     TUBAL LIGATION     Patient Active Problem List   Diagnosis Date Noted   Cellulitis 06/19/2021   HTN (hypertension) 06/19/2021   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) 06/19/2021   Anxiety and  depression 06/19/2021   Iron deficiency anemia 05/04/2021   Port-A-Cath in place 06/16/2020   Genetic testing 05/12/2020   Family history of pancreatic cancer    Family history of skin cancer    Family history of lymphoma    Family history of colon cancer    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 Magnus Ivan, HTN, DDD lumbar spine, Developed multiple bloodclots in LUE due to portacath which are now resolved.  Swelling from bloodclots has not resolved.   PRECAUTIONS: right UE Lymphedema risk, None  SUBJECTIVE: Pt returns for her first 6 month L-Dex screen. "I'll probably be having surgery on my back for the stenosis end of December."   PAIN:  Are you having pain? No  SOZO SCREENING: Patient was assessed today using the SOZO machine to determine the lymphedema index score. This was compared to her baseline score. It was determined that she is within the recommended range when compared to her baseline and no further action is needed at this time. She will continue SOZO screenings. These are done every 3 months for  2 years post operatively followed by every 6 months for 2 years, and then annually.   L-DEX FLOWSHEETS - 07/18/22 1600       L-DEX LYMPHEDEMA SCREENING   Measurement Type Unilateral    L-DEX MEASUREMENT EXTREMITY Upper Extremity    POSITION  Standing    DOMINANT SIDE Right    At Risk Side Right    BASELINE SCORE (UNILATERAL) 0.1    L-DEX SCORE (UNILATERAL) -6.8    VALUE CHANGE (UNILAT) -6.9              Hermenia Bers, PTA 07/18/2022, 4:07 PM

## 2022-07-27 ENCOUNTER — Inpatient Hospital Stay: Payer: PRIVATE HEALTH INSURANCE

## 2022-08-25 ENCOUNTER — Inpatient Hospital Stay: Payer: PRIVATE HEALTH INSURANCE | Attending: Hematology

## 2022-08-25 VITALS — BP 128/90 | HR 78 | Temp 98.0°F | Resp 18

## 2022-08-25 DIAGNOSIS — Z79811 Long term (current) use of aromatase inhibitors: Secondary | ICD-10-CM | POA: Insufficient documentation

## 2022-08-25 DIAGNOSIS — Z170 Estrogen receptor positive status [ER+]: Secondary | ICD-10-CM | POA: Diagnosis not present

## 2022-08-25 DIAGNOSIS — Z79899 Other long term (current) drug therapy: Secondary | ICD-10-CM | POA: Diagnosis not present

## 2022-08-25 DIAGNOSIS — C50411 Malignant neoplasm of upper-outer quadrant of right female breast: Secondary | ICD-10-CM | POA: Diagnosis present

## 2022-08-25 DIAGNOSIS — D509 Iron deficiency anemia, unspecified: Secondary | ICD-10-CM

## 2022-08-25 MED ORDER — LEUPROLIDE ACETATE (3 MONTH) 11.25 MG IM KIT
11.2500 mg | PACK | Freq: Once | INTRAMUSCULAR | Status: AC
  Administered 2022-08-25: 11.25 mg via INTRAMUSCULAR
  Filled 2022-08-25: qty 11.25

## 2022-08-25 NOTE — Progress Notes (Signed)
Patient here today for Lupron injection. Patient tolerated injection with no complaints voiced.  Site clean and dry with no bruising or swelling noted.  No complaints of pain.  Discharged with vital signs stable and no signs or symptoms of distress noted.

## 2022-08-25 NOTE — Patient Instructions (Signed)
[facility]  Discharge Instructions: Thank you for choosing [facility] to provide your oncology and hematology care.  If you have a lab appointment with the Cancer Center - please note that after April 8th, 2024, all labs will be drawn in the cancer center.  You do not have to check in or register with the main entrance as you have in the past but will complete your check-in in the cancer center.  Wear comfortable clothing and clothing appropriate for easy access to any Portacath or PICC line.   We strive to give you quality time with your provider. You may need to reschedule your appointment if you arrive late (15 or more minutes).  Arriving late affects you and other patients whose appointments are after yours.  Also, if you miss three or more appointments without notifying the office, you may be dismissed from the clinic at the provider's discretion.      For prescription refill requests, have your pharmacy contact our office and allow 72 hours for refills to be completed.    Today you received the following chemotherapy and/or immunotherapy agents Lupron.  Leuprolide Solution for Injection What is this medication? LEUPROLIDE (loo PROE lide) reduces the symptoms of prostate cancer. It works by decreasing levels of the hormone testosterone in the body. This prevents prostate cancer cells from spreading or growing. This medicine may be used for other purposes; ask your health care provider or pharmacist if you have questions. COMMON BRAND NAME(S): Lupron What should I tell my care team before I take this medication? They need to know if you have any of these conditions: Diabetes Heart attack Heart disease High blood pressure High cholesterol Pain or difficulty passing urine Spinal cord metastasis Stroke Tobacco use An unusual or allergic reaction to leuprolide, other medications, foods, dyes, or preservatives Pregnant or trying to get  pregnant Breast-feeding How should I use this medication? This medication is for injection under the skin or into a muscle. You will be taught how to prepare and give this medication. Use exactly as directed. Take your medication at regular intervals. Do not take it more often than directed. It is important that you put your used needles and syringes in a special sharps container. Do not put them in a trash can. If you do not have a sharps container, call your care team to get one. A special MedGuide will be given to you by the pharmacist with each prescription and refill. Be sure to read this information carefully each time. Talk to your care team about the use of this medication in children. While this medication may be prescribed for children as young as 8 years for selected conditions, precautions do apply. Overdosage: If you think you have taken too much of this medicine contact a poison control center or emergency room at once. NOTE: This medicine is only for you. Do not share this medicine with others. What if I miss a dose? If you miss a dose, take it as soon as you can. If it is almost time for your next dose, take only that dose. Do not take double or extra doses. What may interact with this medication? Do not take this medication with any of the following: Chasteberry Cisapride Dronedarone Pimozide Thioridazine This medication may also interact with the following: Estrogen or progestin hormones Herbal or dietary supplements, like black cohosh or DHEA Other medications that cause heart rhythm changes Testosterone This list may not describe all possible interactions.  Give your health care provider a list of all the medicines, herbs, non-prescription drugs, or dietary supplements you use. Also tell them if you smoke, drink alcohol, or use illegal drugs. Some items may interact with your medicine. What should I watch for while using this medication? Visit your care team for regular  checks on your progress. During the first week, your symptoms may get worse, but then will improve as you continue your treatment. You may get hot flashes, increased bone pain, increased difficulty passing urine, or an aggravation of nerve symptoms. Discuss these effects with your care team, some of them may improve with continued use of this medication. Patients may experience a menstrual cycle or spotting during the first 2 months of therapy with this medication. If this continues, contact your care team. This medication may increase blood sugar. The risk may be higher in patients who already have diabetes. Ask your care team what you can do to lower your risk of diabetes while taking this medication. What side effects may I notice from receiving this medication? Side effects that you should report to your care team as soon as possible: Allergic reactions--skin rash, itching, hives, swelling of the face, lips, tongue, or throat Heart attack--pain or tightness in the chest, shoulders, arms, or jaw, nausea, shortness of breath, cold or clammy skin, feeling faint or lightheaded Heart rhythm changes--fast or irregular heartbeat, dizziness, feeling faint or lightheaded, chest pain, trouble breathing High blood sugar (hyperglycemia)--increased thirst or amount of urine, unusual weakness or fatigue, blurry vision New or worsening seizures Redness, blistering, peeling, or loosening of the skin, including inside the mouth Stroke--sudden numbness or weakness of the face, arm, or leg, trouble speaking, confusion, trouble walking, loss of balance or coordination, dizziness, severe headache, change in vision Swelling and pain of the tumor site or lymph nodes Side effects that usually do not require medical attention (report these to your care team if they continue or are bothersome): Change in sex drive or performance Hot flashes Joint pain Pain, redness, or irritation at injection site Swelling of the  ankles, hands, or feet Unusual weakness or fatigue This list may not describe all possible side effects. Call your doctor for medical advice about side effects. You may report side effects to FDA at 1-800-FDA-1088. Where should I keep my medication? Keep out of the reach of children and pets. Store below 25 degrees C (77 degrees F). Do not freeze. Protect from light. Get rid of any unused medication after the expiration date. To get rid of medications that are no longer needed or have expired: Take the medication to a medication take-back program. Check with your pharmacy or law enforcement to find a location. If you cannot return the medication, ask your pharmacist or care team how to get rid of this medication safely. NOTE: This sheet is a summary. It may not cover all possible information. If you have questions about this medicine, talk to your doctor, pharmacist, or health care provider.  2024 Elsevier/Gold Standard (2022-05-31 00:00:00)       To help prevent nausea and vomiting after your treatment, we encourage you to take your nausea medication as directed.  BELOW ARE SYMPTOMS THAT SHOULD BE REPORTED IMMEDIATELY: *FEVER GREATER THAN 100.4 F (38 C) OR HIGHER *CHILLS OR SWEATING *NAUSEA AND VOMITING THAT IS NOT CONTROLLED WITH YOUR NAUSEA MEDICATION *UNUSUAL SHORTNESS OF BREATH *UNUSUAL BRUISING OR BLEEDING *URINARY PROBLEMS (pain or burning when urinating, or frequent urination) *BOWEL PROBLEMS (unusual diarrhea, constipation, pain near the  anus) TENDERNESS IN MOUTH AND THROAT WITH OR WITHOUT PRESENCE OF ULCERS (sore throat, sores in mouth, or a toothache) UNUSUAL RASH, SWELLING OR PAIN  UNUSUAL VAGINAL DISCHARGE OR ITCHING   Items with * indicate a potential emergency and should be followed up as soon as possible or go to the Emergency Department if any problems should occur.  Please show the CHEMOTHERAPY ALERT CARD or IMMUNOTHERAPY ALERT CARD at check-in to the Emergency  Department and triage nurse.  Should you have questions after your visit or need to cancel or reschedule your appointment, please contact [facility] [phone]  and follow the prompts.  Office hours are 8:00 a.m. to 4:30 p.m. Monday - Friday. Please note that voicemails left after 4:00 p.m. may not be returned until the following business day.  We are closed weekends and major holidays. You have access to a nurse at all times for urgent questions. Please call the main number to the clinic [phone] and follow the prompts.  For any non-urgent questions, you may also contact your provider using MyChart. We now offer e-Visits for anyone 70 and older to request care online for non-urgent symptoms. For details visit [email or web address].   Also download the MyChart app! Go to the app store, search "MyChart", open the app, select [facility], and log in with your MyChart username and password.

## 2022-08-26 ENCOUNTER — Encounter: Payer: Self-pay | Admitting: Radiology

## 2022-08-26 DIAGNOSIS — C50411 Malignant neoplasm of upper-outer quadrant of right female breast: Secondary | ICD-10-CM

## 2022-08-26 NOTE — Research (Addendum)
OPTIMIZING PSYCHOSOCIAL INTERVENTION FOR BREAST CANCER-RELATED SEXUAL MORBIDITY: THE SEXUAL HEALTH AND INTIMACY EDUCATION (SHINE) TRIAL   08/26/22  PHONE CALL: Followed up on e-mail for interest in the above mentioned study. Patient stated she is interested in participation, so this coordinator briefly described study to patient. Confirmed e-mail address for patient and sent screener survey as well as a copy of the informed consent to patient.   Merri Brunette, RT(R)(T) Clinical Research Coordinator   PHONE CALL: Patient completed screener and was ineligible for study. Called patient to inform her. Patient expressed understanding. Thanked patient for her support and time for the above mentioned study.   Merri Brunette, RT(R)(T) Clinical Research Coordinator

## 2022-08-26 NOTE — Research (Signed)
[facility] 18007: RANDOMIZED [place] CONTROLLED TRIAL OF BUPROPION FOR CANCER RELATED FATIGUE   08/26/22  PHONE CALL: Spoke with Efraim Kaufmann concerning the above mentioned study. Patient confirmed fatigue in the past week is 7/10 and she is interested in learning more about study. This coordinator sent informed consent to patient for review and will follow-up next week. Patient was thanked for her time and consideration of the above mentioned study.   Merri Brunette, RT(R)(T) Clinical Research Coordinator

## 2022-09-12 ENCOUNTER — Telehealth: Payer: Self-pay | Admitting: Radiology

## 2022-09-12 NOTE — Telephone Encounter (Signed)
[facility] 18007: RANDOMIZED [place] CONTROLLED TRIAL OF BUPROPION FOR CANCER RELATED FATIGUE  09/12/2022  PHONE CALL: Confirmed I was speaking with Lauren Ortega. Informed patient reason for call is to follow-up on potential interest or questions on the above mentioned study. Patient states she is very interested in participation. This coordinator plans to contact [facility] tomorrow morning to find out the hours for the lab tech. We can then coordinate a time for consent and all baseline tasks. Patient was thanked for her time and consideration of the above mentioned study.   Merri Brunette, RT(R)(T) Clinical Research Coordinator

## 2022-09-20 NOTE — Telephone Encounter (Signed)
Please advise 

## 2022-09-27 ENCOUNTER — Encounter: Payer: Self-pay | Admitting: Medical Oncology

## 2022-09-27 ENCOUNTER — Inpatient Hospital Stay: Payer: PRIVATE HEALTH INSURANCE | Attending: Hematology

## 2022-09-27 DIAGNOSIS — Z170 Estrogen receptor positive status [ER+]: Secondary | ICD-10-CM

## 2022-09-27 NOTE — Research (Signed)
[facility] 18007: RANDOMIZED [place]ACE[place] CONTROLLED TRIAL OF BUPROPION FOR CANCER RELATED FATIGUE   09/27/22  SECOND REVIEW:  This Coordinator has reviewed this patient's inclusion and exclusion criteria as a second review and confirms Lauren Ortega is eligible for study participation.  Patient may continue with enrollment.   Merri Brunette, RT(R)(T) Clinical Research Coordinator

## 2022-09-27 NOTE — Progress Notes (Signed)
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  This Clinical Research Nurse met with Lauren Ortega, Lauren Ortega on 09/27/22 in a manner and location that ensures patient privacy to discuss participation in the above listed research study.  Patient is Accompanied by her mom .  Patient was previously provided with informed consent documents.  Patient has not yet read the informed consent documents and so documents were reviewed page by page today.  As outlined in the informed consent form, this Nurse and Lauren Ortega discussed the purpose of the research study, the investigational nature of the study, study procedures and requirements for study participation, potential risks and benefits of study participation, as well as alternatives to participation.  This study is blinded or double-blinded. The patient understands participation is voluntary and they may withdraw from study participation at any time.  Each study arm was reviewed, and randomization discussed.  Potential side effects were reviewed with patient as outlined in the consent form, and patient made aware there may be side effects not yet known. The chance of receiving placebo was discussed. Patient understands enrollment is pending full eligibility review.   Confidentiality and how the patient's information will be used as part of study participation were discussed.  Patient was informed there is reimbursement provided for their time and effort spent on trial participation.  The patient is encouraged to discuss research study participation with their insurance provider to determine what costs they may incur as part of study participation, including research related injury.    All questions were answered to patient's satisfaction.  The informed consent and  separate HIPAA Authorization was reviewed page by page.  The patient's mental and emotional status is appropriate to provide informed consent, and the patient verbalizes an understanding of study participation.  Patient has agreed to participate in the above listed research study and has voluntarily signed the informed consent version 02/03/2022 and separate HIPAA Authorization, version 02/03/22  on 09/27/22 at 2:15PM.  The patient was provided with a copy of the signed informed consent form and separate HIPAA Authorization for their reference.  No study specific procedures were obtained prior to the signing of the informed consent document.  Approximately 40 minutes were spent with the patient reviewing the informed consent documents.  After obtaining informed consent patient, voluntarily signed the optional Release of Information form for use throughout trial participation.   Patient consented to the Optional Sample Storage for Possible Future Studies, as well as Collection of personal information for payment and Contact for Future Research.  This Nurse has reviewed this patient's inclusion and exclusion criteria and confirmed Lauren Ortega is eligible for study participation.  Patient will continue with enrollment. Patient history and medications also reviewed by research coordinator, Lauren Ortega, and patient confirms: no history of seizures, bulimia or anorexia nervosa, negative for history of cirrhosis or renal impairment, no sensitivity to bupropion, or lactose allergy, is not taking any medications that contain bupropion, or any MAOI, linezollid, or methylene blue or an anti-psychotic.   Menopausal status (women only): Lauren Ortega is post menopausal with LMP per patient 05/17/2020.   Eligibility confirmed by treating investigator, who also agrees that patient should proceed with enrollment. MD signed eligibility checklist, per study requirement.   Patient has been enrolled to study today and  per study, randomized to [facility].  Study questionnaires: completed at this time, after patient consented to study participation.  Study baseline lab specimens: collected.  Saliva Collection kit: patient was educated on how to collect saliva and to complete the questionnaire for each day of saliva collection. Salvia kit and questionnaires provided to patient take home along with instructions of collection should she forget. Patient knows to start saliva collection, Wednesday, September 4th,  tomorrow morning, three times per day for the next three days. Patient was also provided with paid FedEx shipment form to send to study, should she choose to or if she prefers to return to me to send completed collection to study. Patient reminded to keep samples in refrigerator until shipped or returned to research nurse. Patient informed that study drug will be dispensed next week Monday or Tuesday, depending on shipment arrival from study.   Patient thanked for her time today and was encouraged to call me with questions/concerns. Patient has my direct contact information.    Approximately two hours were spent this afternoon with patient for consenting and completing of baseline study requirements.  Rexene Edison, RN, BSN, CCRC Clinical Research Nurse Lead 09/27/2022 2:24 PM

## 2022-09-28 ENCOUNTER — Encounter (HOSPITAL_COMMUNITY): Payer: Self-pay | Admitting: Hematology

## 2022-09-29 ENCOUNTER — Telehealth: Payer: Self-pay | Admitting: Medical Oncology

## 2022-09-29 NOTE — Telephone Encounter (Signed)
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 Patient states no issues with collection of saliva or with the completion of questionnaires. Patient does report that she has some dry mouth, so it takes her a bit longer to get the saliva collected but she did not express other questions or concerns at this time. Patient was informed that the study drug was shipped from study and expected to arrive at [facility] tomorrow, and that I would call her once I have confirmation that drug is on site, to set up an appointment for Monday afternoon or Tuesday to dispense drug, medication diaries and gift card to her. Patient gave verbal understanding. Patient reports that she will send completed saliva collection kit, via FedEx, as instructed, over this weekend. We concluded that should she have difficulty finding a drop-off site, that she can bring the kit with her to give me when we meet for drug dispensing.  All patient's questions answered to her satisfaction. Patient thanked for her time and encouraged to call me should she have further questions/concerns.   Rexene Edison, RN, BSN, CCRC Clinical Research Nurse Lead 09/29/2022 2:56 PM

## 2022-09-30 ENCOUNTER — Other Ambulatory Visit: Payer: Self-pay | Admitting: Medical Oncology

## 2022-09-30 ENCOUNTER — Other Ambulatory Visit (HOSPITAL_COMMUNITY): Payer: Self-pay | Admitting: Hematology

## 2022-09-30 ENCOUNTER — Telehealth: Payer: Self-pay | Admitting: Medical Oncology

## 2022-09-30 ENCOUNTER — Encounter: Payer: Self-pay | Admitting: Hematology

## 2022-09-30 DIAGNOSIS — C50411 Malignant neoplasm of upper-outer quadrant of right female breast: Secondary | ICD-10-CM

## 2022-09-30 NOTE — Telephone Encounter (Signed)
[facility] [address]007: RANDOMIZED PLACEBO CONTROLLED TRIAL OF BUPROPION FOR CANCER RELATED FATIGUE   Outgoing call : [phone]  Call to patient to inform her that study drug has arrived and to schedule with patient dispensing of it.  Patient and I agreed to meet at 4:30 Monday the 9th for dispensing of bupropion/placebo.   Patient thanked and encouraged to call should her availability changed.   Rexene Edison, RN, BSN, CCRC Clinical Research Nurse Lead 09/30/2022 3:47 PM

## 2022-10-01 ENCOUNTER — Other Ambulatory Visit: Payer: Self-pay

## 2022-10-03 ENCOUNTER — Other Ambulatory Visit: Payer: Self-pay | Admitting: Medical Oncology

## 2022-10-03 ENCOUNTER — Encounter: Payer: Self-pay | Admitting: Medical Oncology

## 2022-10-03 ENCOUNTER — Other Ambulatory Visit: Payer: Self-pay | Admitting: Hematology

## 2022-10-03 DIAGNOSIS — C50411 Malignant neoplasm of upper-outer quadrant of right female breast: Secondary | ICD-10-CM

## 2022-10-03 MED ORDER — INV-URCC-18007 BUPROPION XL 150 MG/PLACEBO CAP: ORAL_CAPSULE | ORAL | 0 refills | Status: DC

## 2022-10-03 NOTE — Progress Notes (Unsigned)
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  Patient was dispensed bupropion 150mg  XL/placebo, 169 capsules divided between two bottles and count confirmed by PharmD Pryor Ochoa, sent from [facility]. I reviewed with patient instructions on self administration and documentation of administration on the study provided medication diary. Patient knows to start study drug tomorrow morning (September 10th). Patient was also educated on to limit use of alcohol while taking study drug. Patient gave verbal understanding to instructions. Reviewed with patient best way for contact regarding reminders from study, patient confirms a text is best but email and phone call works as well.  Saliva Kit: Collected patient's completed saliva kit and diaries. Patient confirms no difficulty collecting specimen.  Medication diary: provided to patient and instructions on documentation.  Gift card: provided to patient today and patient was informed I will call her when study has received the required data and will load funds to gift card.   Medication list: I reviewed with patient her current medications and any over the counter ones not listed. Patient informed me that she is taking an OTC brand called [facility] that has a blend of different herbs and she started this past August 1st. Patient reports she takes these blends as needed and will email me the ingredients. I asked patient to hold off on taking anymore so I can make sure that there are no possible issues with this blend and the study drug, patient agreed.   Plan: Patient informed I will follow-up with her in the next few days to confirm self administration of study drug, documentation of self administration and to make sure all her questions are answered. Patient was asked to call should she  have any issues or concerns and to report any side effects she may notice. Patient thanked for her time and contribution to study.  Rexene Edison, RN, BSN, CCRC Clinical Research Nurse Lead 10/03/2022 4:51 PM

## 2022-10-04 ENCOUNTER — Encounter: Payer: Self-pay | Admitting: Hematology

## 2022-10-04 ENCOUNTER — Other Ambulatory Visit: Payer: Self-pay | Admitting: *Deleted

## 2022-10-04 ENCOUNTER — Encounter (HOSPITAL_COMMUNITY): Payer: Self-pay | Admitting: Hematology

## 2022-10-04 NOTE — Telephone Encounter (Signed)
Anastrozole refill approved.  Patient is tolerating and is to continue therapy.

## 2022-10-05 ENCOUNTER — Encounter: Payer: Self-pay | Admitting: Hematology

## 2022-10-05 ENCOUNTER — Encounter (HOSPITAL_COMMUNITY): Payer: Self-pay | Admitting: Hematology

## 2022-10-10 ENCOUNTER — Telehealth: Payer: Self-pay | Admitting: Medical Oncology

## 2022-10-10 NOTE — Telephone Encounter (Signed)
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 Patient has been on study medication for one week at 1 tablet/day. Scheduled to start tomorrow at 2 tablets/day. Call to patient to confirm no issues with self-administration and documentation on study provided diary.  Call back number provided. Patient thanked.   Rexene Edison, RN, BSN, CCRC Clinical Research Nurse Lead 10/10/2022 4:15 PM

## 2022-10-20 ENCOUNTER — Inpatient Hospital Stay: Payer: PRIVATE HEALTH INSURANCE

## 2022-10-24 ENCOUNTER — Telehealth: Payer: Self-pay | Admitting: Medical Oncology

## 2022-10-24 NOTE — Telephone Encounter (Signed)
Incoming call: [phone]  Patient returned call and confirms to be taking 2 capsules and denies having any issues or side effects. Patient denied any questions at this time and was encouraged to call with question/concerns. Patient informed next follow-up is in 4 weeks time. Patient thanked.   Rexene Edison, RN, BSN, CCRC Clinical Research Nurse Lead 10/24/2022 4:14 PM

## 2022-10-24 NOTE — Telephone Encounter (Signed)
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 Asked patient to return call at earliest convenience. Call back number provided.  Patient should be currently taking two capsules of study drug, starting on September 17th.   Rexene Edison, RN, BSN, CCRC Clinical Research Nurse Lead 10/24/2022 3:55 PM

## 2022-10-27 ENCOUNTER — Inpatient Hospital Stay: Payer: PRIVATE HEALTH INSURANCE | Admitting: Hematology

## 2022-10-27 ENCOUNTER — Other Ambulatory Visit: Payer: Self-pay | Admitting: Hematology

## 2022-10-27 ENCOUNTER — Inpatient Hospital Stay: Payer: PRIVATE HEALTH INSURANCE

## 2022-11-15 ENCOUNTER — Telehealth: Payer: Self-pay | Admitting: Medical Oncology

## 2022-11-15 NOTE — Telephone Encounter (Signed)
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 Patient confirms having no side-effects and believes she may have missed one day of pills, but is unsure. Patient also confirms to continue to be holding off on taking any of her OTC brand called [facility], while on study. Patient denies any questions at this time.   Plan: Patient informed that research will contact her in three weeks to schedule an appointment for the 12 week lab, PRO's, and provide the saliva collection kit.   Patient thanked for her time and continued support of study and was encouraged to call with questions.  Rexene Edison, RN, BSN, CCRC Clinical Research Nurse Lead 11/15/2022 3:59 PM

## 2022-11-25 ENCOUNTER — Inpatient Hospital Stay: Payer: PRIVATE HEALTH INSURANCE | Attending: Hematology

## 2022-11-25 ENCOUNTER — Inpatient Hospital Stay: Payer: PRIVATE HEALTH INSURANCE

## 2022-11-25 VITALS — BP 116/83 | HR 75 | Temp 98.6°F | Resp 18

## 2022-11-25 DIAGNOSIS — D509 Iron deficiency anemia, unspecified: Secondary | ICD-10-CM

## 2022-11-25 DIAGNOSIS — C50411 Malignant neoplasm of upper-outer quadrant of right female breast: Secondary | ICD-10-CM | POA: Insufficient documentation

## 2022-11-25 DIAGNOSIS — M8580 Other specified disorders of bone density and structure, unspecified site: Secondary | ICD-10-CM | POA: Diagnosis not present

## 2022-11-25 DIAGNOSIS — G629 Polyneuropathy, unspecified: Secondary | ICD-10-CM | POA: Insufficient documentation

## 2022-11-25 DIAGNOSIS — Z79899 Other long term (current) drug therapy: Secondary | ICD-10-CM | POA: Diagnosis not present

## 2022-11-25 DIAGNOSIS — Z79811 Long term (current) use of aromatase inhibitors: Secondary | ICD-10-CM | POA: Insufficient documentation

## 2022-11-25 DIAGNOSIS — M5432 Sciatica, left side: Secondary | ICD-10-CM | POA: Diagnosis not present

## 2022-11-25 DIAGNOSIS — Z170 Estrogen receptor positive status [ER+]: Secondary | ICD-10-CM

## 2022-11-25 LAB — CBC WITH DIFFERENTIAL/PLATELET
Abs Immature Granulocytes: 0.03 10*3/uL (ref 0.00–0.07)
Basophils Absolute: 0.1 10*3/uL (ref 0.0–0.1)
Basophils Relative: 1 %
Eosinophils Absolute: 0.2 10*3/uL (ref 0.0–0.5)
Eosinophils Relative: 3 %
HCT: 39.4 % (ref 36.0–46.0)
Hemoglobin: 13.5 g/dL (ref 12.0–15.0)
Immature Granulocytes: 0 %
Lymphocytes Relative: 15 %
Lymphs Abs: 1.2 10*3/uL (ref 0.7–4.0)
MCH: 31.5 pg (ref 26.0–34.0)
MCHC: 34.3 g/dL (ref 30.0–36.0)
MCV: 92.1 fL (ref 80.0–100.0)
Monocytes Absolute: 0.8 10*3/uL (ref 0.1–1.0)
Monocytes Relative: 11 %
Neutro Abs: 5.6 10*3/uL (ref 1.7–7.7)
Neutrophils Relative %: 70 %
Platelets: 347 10*3/uL (ref 150–400)
RBC: 4.28 MIL/uL (ref 3.87–5.11)
RDW: 13.2 % (ref 11.5–15.5)
WBC: 7.9 10*3/uL (ref 4.0–10.5)
nRBC: 0 % (ref 0.0–0.2)

## 2022-11-25 LAB — COMPREHENSIVE METABOLIC PANEL
ALT: 21 U/L (ref 0–44)
AST: 21 U/L (ref 15–41)
Albumin: 4.3 g/dL (ref 3.5–5.0)
Alkaline Phosphatase: 112 U/L (ref 38–126)
Anion gap: 10 (ref 5–15)
BUN: 10 mg/dL (ref 6–20)
CO2: 27 mmol/L (ref 22–32)
Calcium: 9.8 mg/dL (ref 8.9–10.3)
Chloride: 100 mmol/L (ref 98–111)
Creatinine, Ser: 0.8 mg/dL (ref 0.44–1.00)
GFR, Estimated: >60 mL/min (ref >60)
Glucose, Bld: 96 mg/dL (ref 70–99)
Potassium: 3.9 mmol/L (ref 3.5–5.1)
Sodium: 137 mmol/L (ref 135–145)
Total Bilirubin: 0.7 mg/dL (ref 0.3–1.2)
Total Protein: 7.4 g/dL (ref 6.5–8.1)

## 2022-11-25 LAB — IRON AND TIBC
Iron: 103 ug/dL (ref 28–170)
Saturation Ratios: 24 % (ref 10.4–31.8)
TIBC: 428 ug/dL (ref 250–450)
UIBC: 325 ug/dL

## 2022-11-25 LAB — FERRITIN: Ferritin: 60 ng/mL (ref 11–307)

## 2022-11-25 MED ORDER — LEUPROLIDE ACETATE (3 MONTH) 11.25 MG IM KIT
11.2500 mg | PACK | Freq: Once | INTRAMUSCULAR | Status: AC
  Administered 2022-11-25: 11.25 mg via INTRAMUSCULAR
  Filled 2022-11-25: qty 11.25

## 2022-11-25 NOTE — Patient Instructions (Signed)
[facility]  Discharge Instructions: Thank you for choosing [facility] to provide your oncology and hematology care.  If you have a lab appointment with the Cancer Center - please note that after April 8th, 2024, all labs will be drawn in the cancer center.  You do not have to check in or register with the main entrance as you have in the past but will complete your check-in in the cancer center.  Wear comfortable clothing and clothing appropriate for easy access to any Portacath or PICC line.   We strive to give you quality time with your provider. You may need to reschedule your appointment if you arrive late (15 or more minutes).  Arriving late affects you and other patients whose appointments are after yours.  Also, if you miss three or more appointments without notifying the office, you may be dismissed from the clinic at the provider's discretion.      For prescription refill requests, have your pharmacy contact our office and allow 72 hours for refills to be completed.    Today you received the following injection-Lupron   To help prevent nausea and vomiting after your treatment, we encourage you to take your nausea medication as directed.  BELOW ARE SYMPTOMS THAT SHOULD BE REPORTED IMMEDIATELY: *FEVER GREATER THAN 100.4 F (38 C) OR HIGHER *CHILLS OR SWEATING *NAUSEA AND VOMITING THAT IS NOT CONTROLLED WITH YOUR NAUSEA MEDICATION *UNUSUAL SHORTNESS OF BREATH *UNUSUAL BRUISING OR BLEEDING *URINARY PROBLEMS (pain or burning when urinating, or frequent urination) *BOWEL PROBLEMS (unusual diarrhea, constipation, pain near the anus) TENDERNESS IN MOUTH AND THROAT WITH OR WITHOUT PRESENCE OF ULCERS (sore throat, sores in mouth, or a toothache) UNUSUAL RASH, SWELLING OR PAIN  UNUSUAL VAGINAL DISCHARGE OR ITCHING   Items with * indicate a potential emergency and should be followed up as soon as possible or go to the Emergency Department if any problems  should occur.  Please show the CHEMOTHERAPY ALERT CARD or IMMUNOTHERAPY ALERT CARD at check-in to the Emergency Department and triage nurse.  Should you have questions after your visit or need to cancel or reschedule your appointment, please contact [facility] [phone]  and follow the prompts.  Office hours are 8:00 a.m. to 4:30 p.m. Monday - Friday. Please note that voicemails left after 4:00 p.m. may not be returned until the following business day.  We are closed weekends and major holidays. You have access to a nurse at all times for urgent questions. Please call the main number to the clinic [phone] and follow the prompts.  For any non-urgent questions, you may also contact your provider using MyChart. We now offer e-Visits for anyone 54 and older to request care online for non-urgent symptoms. For details visit [email or web address].   Also download the MyChart app! Go to the app store, search "MyChart", open the app, select [place], and log in with your MyChart username and password.

## 2022-11-25 NOTE — Progress Notes (Signed)
Lupron injection given per orders. Patient tolerated it well without problems. Vitals stable and discharged home from clinic ambulatory. Follow up as scheduled.

## 2022-11-26 LAB — CANCER ANTIGEN 27.29: CA 27.29: 13.7 U/mL (ref 0.0–38.6)

## 2022-11-26 LAB — CANCER ANTIGEN 15-3: CA 15-3: 8.1 U/mL (ref 0.0–25.0)

## 2022-12-01 ENCOUNTER — Encounter: Payer: Self-pay | Admitting: Hematology

## 2022-12-01 ENCOUNTER — Inpatient Hospital Stay: Payer: PRIVATE HEALTH INSURANCE | Admitting: Hematology

## 2022-12-01 ENCOUNTER — Encounter (HOSPITAL_COMMUNITY): Payer: Self-pay | Admitting: Hematology

## 2022-12-05 ENCOUNTER — Inpatient Hospital Stay (HOSPITAL_BASED_OUTPATIENT_CLINIC_OR_DEPARTMENT_OTHER): Payer: PRIVATE HEALTH INSURANCE | Admitting: Hematology

## 2022-12-05 ENCOUNTER — Encounter: Payer: Self-pay | Admitting: Hematology

## 2022-12-05 VITALS — BP 137/83 | HR 95 | Temp 98.7°F | Resp 19 | Ht 60.0 in | Wt 152.2 lb

## 2022-12-05 DIAGNOSIS — C50411 Malignant neoplasm of upper-outer quadrant of right female breast: Secondary | ICD-10-CM

## 2022-12-05 DIAGNOSIS — K769 Liver disease, unspecified: Secondary | ICD-10-CM

## 2022-12-05 DIAGNOSIS — Z170 Estrogen receptor positive status [ER+]: Secondary | ICD-10-CM

## 2022-12-05 NOTE — Progress Notes (Signed)
[facility] [address], [place] [address]    Clinic Day:  12/05/2022  Referring physician: Drema Halon, FNP  Patient Care Team: Drema Halon, FNP as PCP - General (Family Medicine) Therese Sarah, RN as Oncology Nurse Navigator (Oncology)   ASSESSMENT & PLAN:   Assessment: 1.  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  She had mammogram/ultrasound on 03/18/2020 done in [place] which showed a 1.4 x 0.9 x 1.5 cm mass in the upper outer quadrant of the right breast. -Biopsy on 03/31/2020 consistent with intermediate to high-grade infiltrating ductal carcinoma of the right breast at 10:30 position, HER-2 3+ positive, Ki-67 40%, ER weak staining in less than 10% of tumor cells, PR negative. -MRI of the breast on 04/20/2020 with 1.3 x 1.5 x 1.4 cm irregular enhancing mass within the upper outer right breast.  No abnormal appearing lymph nodes. - Right breast lumpectomy and SLNB on 05/20/2020- grade 3 IDC, 1.8 cm, invasive carcinoma is less than 1 mm from anterior margin focally and less than 1 mm from the posterior margin broadly.  Margins negative for in situ carcinoma.  Lymphovascular invasion present.  0/5 lymph nodes positive.  There is focal ductal carcinoma within the vascular space in the soft tissue adjacent to the lymph node.  No carcinoma is seen within the lymph nodes.  PT1CPN0. - Genetic testing was negative. - 6 cycles of TCH from 06/23/2020 through 10/07/2020. - XRT to the breast, 20 fractions completed on 01/15/2021 with Dr. Jannet Mantis in [place]. - Tamoxifen was discontinued secondary to leg pains. - Estradiol and FSH levels were in the postmenopausal range.  She did not have any manses since the start of chemotherapy in June 2022. - Anastrozole started on 03/24/2021.   2.  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  Never smoker. -Maternal grandmother had colon cancer.  Maternal grandfather died of non-Hodgkin's lymphoma.  Paternal grandfather died of lung cancer.  Maternal uncle had multiple myeloma.  Mother had basal cell skin cancer.  3.  Left upper extremity DVT: - Doppler on 07/06/2020 positive for acute DVT in the left axillary, peripheral subclavian vein and superficial thrombosis involving left basilic vein. - This is port induced and she was started on Eliquis.   Plan: 1.  Stage I (T1 cN0 M0) right breast IDC, HER2 positive: - Mammogram on 04/05/2022: BI-RADS Category 2. - Physical exam today: Lumpectomy scar in the right breast around the areola is within normal limits.  No palpable adenopathy or masses. - Last Lupron injection was on 11/25/2022. - Reviewed labs today: Normal LFTs.  CBC was grossly normal.  Tumor markers within normal limits. - Recommend continuing anastrozole daily which she is tolerating reasonably well.  RTC 6 months for follow-up.  Will schedule screening mammogram in March.   2.  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 - Will obtain MRI of the liver with and without contrast with Eovist prior to next visit.  3.  Osteopenia (DEXA on 06/16/2021 T-score -1.1): - Continue calcium and vitamin D supplements.  Last vitamin D was normal at 67.  4.  Peripheral neuropathy: - She has discontinued Cymbalta.  5.  Left-sided sciatic pain: - She is having laminectomy at L2-3 and L4-5 on 01/13/2023.    Orders Placed This Encounter  Procedures  MR LIVER W WO CONTRAST    Standing Status:   Future    Standing Expiration Date:   12/05/2023    Order Specific Question:   If indicated for the ordered procedure, I authorize the administration of contrast media per Radiology protocol    Answer:   Yes    Order Specific Question:   What is the patient's sedation requirement?    Answer:   No Sedation    Order Specific Question:   Does  the patient have a pacemaker or implanted devices?    Answer:   No    Order Specific Question:   Release to patient    Answer:   Immediate    Order Specific Question:   Preferred imaging location?    Answer:   [facility] (table limit - 550lbs)   CBC with Differential    Standing Status:   Future    Standing Expiration Date:   12/05/2023   Comprehensive metabolic panel    Standing Status:   Future    Standing Expiration Date:   12/05/2023   VITAMIN D 25 Hydroxy (Vit-D Deficiency, Fractures)    Standing Status:   Future    Standing Expiration Date:   12/05/2023   Cancer antigen 15-3    Standing Status:   Future    Standing Expiration Date:   12/05/2023   Cancer antigen 27.29    Standing Status:   Future    Standing Expiration Date:   12/05/2023      Alben Deeds Teague,acting as a scribe for Doreatha Massed, MD.,have documented all relevant documentation on the behalf of Doreatha Massed, MD,as directed by  Doreatha Massed, MD while in the presence of Doreatha Massed, MD.  I, Doreatha Massed MD, have reviewed the above documentation for accuracy and completeness, and I agree with the above.    Doreatha Massed, MD   11/11/20246:23 PM  CHIEF COMPLAINT:   Diagnosis: right breast cancer    Cancer Staging  Malignant neoplasm of upper-outer quadrant of right female breast [facility]) Staging form: Breast, AJCC 8th Edition - Clinical stage from 04/21/2020: Stage IA (cT1c, cN0, cM0, G3, ER+, PR-, HER2+) - Unsigned    Prior Therapy: 1. Right breast lumpectomy and SLNB on 05/20/2020  2. 6 cycles of TCH from 06/23/2020 through 10/07/2020.  3. XRT to the breast, 20 fractions completed on 01/15/2021   Current Therapy:  anastrozole and Lupron   HISTORY OF PRESENT ILLNESS:   Oncology History  Malignant neoplasm of upper-outer quadrant of right female breast (HCC)  04/16/2020 Initial Diagnosis   Malignant neoplasm of upper-outer quadrant of right female breast  [facility])    Genetic Testing   Negative genetic testing. No pathogenic variants identified on the [facility] Multi-Cancer Panel+RNA. The report date is 05/10/2020.  The Multi-Cancer Panel + RNA offered by [facility] includes sequencing and/or deletion duplication testing of the following 84 genes: AIP, ALK, APC, ATM, AXIN2,BAP1,  BARD1, BLM, BMPR1A, BRCA1, BRCA2, BRIP1, CASR, CDC73, CDH1, CDK4, CDKN1B, CDKN1C, CDKN2A (p14ARF), CDKN2A (p16INK4a), CEBPA, CHEK2, CTNNA1, DICER1, DIS3L2, EGFR (c.2369C>T, p.Thr790Met variant only), EPCAM (Deletion/duplication testing only), FH, FLCN, GATA2, GPC3, GREM1 (Promoter region deletion/duplication testing only), HOXB13 (c.251G>A, p.Gly84Glu), HRAS, KIT, MAX, MEN1, MET, MITF (c.952G>A, p.Glu318Lys variant only), MLH1, MSH2, MSH3, MSH6, MUTYH, NBN, NF1, NF2, NTHL1, PALB2, PDGFRA, PHOX2B, PMS2, POLD1, POLE, POT1, PRKAR1A, PTCH1, PTEN, RAD50, RAD51C, RAD51D, RB1, RECQL4, RET, RUNX1, SDHAF2, SDHA (sequence changes only), SDHB, SDHC, SDHD, SMAD4, SMARCA4, SMARCB1, SMARCE1, STK11, SUFU, TERC, TERT, TMEM127, TP53,  TSC1, TSC2, VHL, WRN and WT1.   06/23/2020 - 05/26/2021 Chemotherapy   Patient is on Treatment Plan : BREAST Docetaxel + Carboplatin + Trastuzumab (TCH) q21d / Trastuzumab q21d     10/03/2022 -  Chemotherapy   Patient is on Treatment Plan : BREAST (RESEARCH) [id number] BUPROPION/PLACEBO        INTERVAL HISTORY:   Preksha is a 42 y.o. female presenting to clinic today for follow up of right breast cancer. She was last seen by me on 04/27/22.  Today, she states that she is doing well overall. Her appetite level is at 100%. Her energy level is at 75%.  PAST MEDICAL HISTORY:   Past Medical History: Past Medical History:  Diagnosis Date   Anemia    Anxiety    Breast cancer (HCC) 03/2020   IDC grade 3 neg margins   Family history of colon cancer    Family history of lymphoma    Family history of pancreatic cancer    Family history of skin cancer    Heart murmur     Hypertension    Lumbar degenerative disc disease    Neuropathy due to chemotherapeutic drug (HCC)    Port-A-Cath in place 06/16/2020    Surgical History: Past Surgical History:  Procedure Laterality Date   BREAST LUMPECTOMY WITH RADIOACTIVE SEED AND SENTINEL LYMPH NODE BIOPSY Right 05/20/2020   Procedure: RIGHT BREAST LUMPECTOMY WITH RADIOACTIVE SEED AND SENTINEL LYMPH NODE BIOPSY;  Surgeon: Abigail Miyamoto, MD;  Location: [facility];  Service: General;  Laterality: Right;   CESAREAN SECTION     x3   PORT-A-CATH REMOVAL Left 06/19/2021   Procedure: REMOVAL PORT-A-CATH;  Surgeon: Diamantina Monks, MD;  Location: [facility] OR;  Service: General;  Laterality: Left;   PORTACATH PLACEMENT Left 05/20/2020   Procedure: INSERTION PORT-A-CATH;  Surgeon: Abigail Miyamoto, MD;  Location: [facility];  Service: General;  Laterality: Left;   TONSILLECTOMY     TUBAL LIGATION      Social History: Social History   Socioeconomic History   Marital status: Divorced    Spouse name: Not on file   Number of children: Not on file   Years of education: Not on file   Highest education level: Not on file  Occupational History   Not on file  Tobacco Use   Smoking status: Never   Smokeless tobacco: Never  Substance and Sexual Activity   Alcohol use: Yes    Comment: occassionally   Drug use: Never   Sexual activity: Yes    Birth control/protection: Surgical  Other Topics Concern   Not on file  Social History Narrative   Not on file   Social Determinants of Health   Financial Resource Strain: Low Risk  (04/21/2020)   Overall Financial Resource Strain (CARDIA)    Difficulty of Paying Living Expenses: Not hard at all  Food Insecurity: No Food Insecurity (04/21/2020)   Hunger Vital Sign    Worried About Running Out of Food in the Last Year: Never true    Ran Out of Food in the Last Year: Never true  Transportation Needs: No Transportation Needs (04/21/2020)   PRAPARE -  Administrator, Civil Service (Medical): No    Lack of Transportation (Non-Medical): No  Physical Activity: Insufficiently Active (04/21/2020)   Exercise Vital Sign    Days of Exercise per Week: 2 days    Minutes of Exercise per Session: 20 min  Stress: No Stress Concern Present (04/21/2020)  [employer] of Occupational Health - Occupational Stress Questionnaire    Feeling of Stress : Not at all  Social Connections: Moderately Integrated (04/21/2020)   Social Connection and Isolation Panel [NHANES]    Frequency of Communication with Friends and Family: More than three times a week    Frequency of Social Gatherings with Friends and Family: More than three times a week    Attends Religious Services: 1 to 4 times per year    Active Member of [employer] or Organizations: No    Attends Engineer, structural: 1 to 4 times per year    Marital Status: Divorced  Catering manager Violence: Not At Risk (04/21/2020)   Humiliation, Afraid, Rape, and Kick questionnaire    Fear of Current or Ex-Partner: No    Emotionally Abused: No    Physically Abused: No    Sexually Abused: No    Family History: Family History  Problem Relation Age of Onset   Basal cell carcinoma Mother 26   Bladder Cancer Maternal Uncle 72   Lymphoma Maternal Uncle 9       Waldenstroms    Pancreatic cancer Maternal Grandmother    Non-Hodgkin's lymphoma Maternal Grandfather    Lung cancer Paternal Grandfather    Colon cancer Other        x8 or 9   Colon cancer Maternal Great-grandfather     Current Medications:  Current Outpatient Medications:    acetaminophen (TYLENOL) 500 MG tablet, Take 2 tablets (1,000 mg total) by mouth 4 (four) times daily., Disp: 120 tablet, Rfl: 3   amLODipine (NORVASC) 10 MG tablet, Take 10 mg by mouth daily., Disp: , Rfl:    anastrozole (ARIMIDEX) 1 MG tablet, TAKE 1 TABLET BY MOUTH EVERY DAY, Disp: 90 tablet, Rfl: 1   cyanocobalamin (VITAMIN B12) 1000 MCG tablet, TAKE 1  TABLET BY MOUTH EVERY DAY, Disp: 90 tablet, Rfl: 1   DULoxetine (CYMBALTA) 30 MG capsule, TAKE 1 CAPSULE BY MOUTH 2 TIMES DAILY., Disp: 180 capsule, Rfl: 1   estradiol (ESTRACE VAGINAL) 0.1 MG/GM vaginal cream, Place 1 Applicatorful vaginally 2 (two) times daily. Twice daily for two weeks, then twice weekly., Disp: 170 g, Rfl: 12   folic acid (FOLVITE) 1 MG tablet, Take 1 mg by mouth daily., Disp: , Rfl:    ibuprofen (ADVIL) 600 MG tablet, Take 1 tablet (600 mg total) by mouth 4 (four) times daily., Disp: 120 tablet, Rfl: 1   [id number] bupropion xl/placebo 150 MG capsule, Take 1 capsule (150 mg total) by mouth every morning for week 1, then take 2 capsules (300 mg total) every morning from week 2-12, then take 1 capsule (150mg  total) by mouth every morning for the week 13 (last week of treatment). Please bring your bottle to Research visits., Disp: 168 capsule, Rfl: 0   magnesium oxide (MAG-OX) 400 (240 Mg) MG tablet, TAKE 1 TABLET BY MOUTH TWICE A DAY IN THE MORNING AND IN THE EVENING (Patient taking differently: Take 400 mg by mouth 2 (two) times daily.), Disp: 180 tablet, Rfl: 2   spironolactone (ALDACTONE) 50 MG tablet, Take 50 mg by mouth every morning., Disp: , Rfl:    Allergies: No Known Allergies  REVIEW OF SYSTEMS:   Review of Systems  Constitutional:  Negative for chills, fatigue and fever.  HENT:   Negative for lump/mass, mouth sores, nosebleeds, sore throat and trouble swallowing.   Eyes:  Negative for eye problems.  Respiratory:  Negative for cough and shortness of breath.  Cardiovascular:  Negative for chest pain, leg swelling and palpitations.  Gastrointestinal:  Negative for abdominal pain, constipation, diarrhea, nausea and vomiting.  Genitourinary:  Negative for bladder incontinence, difficulty urinating, dysuria, frequency, hematuria and nocturia.   Musculoskeletal:  Negative for arthralgias, back pain, flank pain, myalgias and neck pain.  Skin:  Negative for itching  and rash.  Neurological:  Negative for dizziness, headaches and numbness.  Hematological:  Does not bruise/bleed easily.  Psychiatric/Behavioral:  Negative for depression, sleep disturbance and suicidal ideas. The patient is not nervous/anxious.   All other systems reviewed and are negative.    VITALS:   Blood pressure 137/83, pulse 95, temperature 98.7 F (37.1 C), temperature source Oral, resp. rate 19, height 5' (1.524 m), weight 152 lb 3.2 oz (69 kg), last menstrual period 05/17/2020, SpO2 97%.  Wt Readings from Last 3 Encounters:  12/05/22 152 lb 3.2 oz (69 kg)  07/18/22 150 lb 4 oz (68.2 kg)  04/27/22 149 lb 3.2 oz (67.7 kg)    Body mass index is 29.72 kg/m.  Performance status (ECOG): 0 - Asymptomatic  PHYSICAL EXAM:   Physical Exam Vitals and nursing note reviewed. Exam conducted with a chaperone present.  Constitutional:      Appearance: Normal appearance.  Cardiovascular:     Rate and Rhythm: Normal rate and regular rhythm.     Pulses: Normal pulses.     Heart sounds: Normal heart sounds.  Pulmonary:     Effort: Pulmonary effort is normal.     Breath sounds: Normal breath sounds.  Abdominal:     Palpations: Abdomen is soft. There is no hepatomegaly, splenomegaly or mass.     Tenderness: There is no abdominal tenderness.  Musculoskeletal:     Right lower leg: No edema.     Left lower leg: No edema.  Lymphadenopathy:     Cervical: No cervical adenopathy.     Right cervical: No superficial, deep or posterior cervical adenopathy.    Left cervical: No superficial, deep or posterior cervical adenopathy.     Upper Body:     Right upper body: No supraclavicular or axillary adenopathy.     Left upper body: No supraclavicular or axillary adenopathy.  Neurological:     General: No focal deficit present.     Mental Status: She is alert and oriented to person, place, and time.  Psychiatric:        Mood and Affect: Mood normal.        Behavior: Behavior normal.      LABS:      Latest Ref Rng & Units 11/25/2022    9:27 AM 04/20/2022   10:11 AM 10/12/2021    8:40 AM  CBC  WBC 4.0 - 10.5 K/uL 7.9  5.0  5.3   Hemoglobin 12.0 - 15.0 g/dL 16.1  09.6  04.5   Hematocrit 36.0 - 46.0 % 39.4  39.6  40.2   Platelets 150 - 400 K/uL 347  327  360       Latest Ref Rng & Units 11/25/2022    9:27 AM 04/20/2022   10:11 AM 10/12/2021    8:40 AM  CMP  Glucose 70 - 99 mg/dL 96  95  409   BUN 6 - 20 mg/dL 10  18  19    Creatinine 0.44 - 1.00 mg/dL 8.11  9.14  7.82   Sodium 135 - 145 mmol/L 137  136  140   Potassium 3.5 - 5.1 mmol/L 3.9  4.0  3.9  Chloride 98 - 111 mmol/L 100  101  105   CO2 22 - 32 mmol/L 27  28  27    Calcium 8.9 - 10.3 mg/dL 9.8  9.3  46.9   Total Protein 6.5 - 8.1 g/dL 7.4  7.3  8.1   Total Bilirubin 0.3 - 1.2 mg/dL 0.7  0.7  0.8   Alkaline Phos 38 - 126 U/L 112  93  93   AST 15 - 41 U/L 21  20  27    ALT 0 - 44 U/L 21  19  28       No results found for: "CEA1", "CEA" / No results found for: "CEA1", "CEA" No results found for: "PSA1" No results found for: "GEX528" No results found for: "CAN125"  No results found for: "TOTALPROTELP", "ALBUMINELP", "A1GS", "A2GS", "BETS", "BETA2SER", "GAMS", "MSPIKE", "SPEI" Lab Results  Component Value Date   TIBC 428 11/25/2022   TIBC 456 (H) 04/20/2022   TIBC 475 (H) 10/12/2021   FERRITIN 60 11/25/2022   FERRITIN 44 04/20/2022   FERRITIN 98 10/12/2021   IRONPCTSAT 24 11/25/2022   IRONPCTSAT 19 04/20/2022   IRONPCTSAT 32 (H) 10/12/2021   No results found for: "LDH"   STUDIES:   No results found.

## 2022-12-05 NOTE — Patient Instructions (Addendum)
[facility] Discharge Instructions   You were seen and examined today by Dr. Ellin Saba.  He reviewed the results of your lab work which are normal.   Continue anastrozole as prescribed.   We will repeat a mammogram in March of next year.   We will see you back in 6 months.   Return as scheduled.    Thank you for choosing [facility] to provide your oncology and hematology care.  To afford each patient quality time with our provider, please arrive at least 15 minutes before your scheduled appointment time.   If you have a lab appointment with the Cancer Center please come in thru the Main Entrance and check in at the main information desk.  You need to re-schedule your appointment should you arrive 10 or more minutes late.  We strive to give you quality time with our providers, and arriving late affects you and other patients whose appointments are after yours.  Also, if you no show three or more times for appointments you may be dismissed from the clinic at the providers discretion.     Again, thank you for choosing [facility].  Our hope is that these requests will decrease the amount of time that you wait before being seen by our physicians.       _____________________________________________________________  Should you have questions after your visit to [facility] please contact our office at [phone] and follow the prompts.  Our office hours are 8:00 a.m. and 4:30 p.m. Monday - Friday.  Please note that voicemails left after 4:00 p.m. may not be returned until the following business day.  We are closed weekends and major holidays.  You do have access to a nurse 24-7, just call the main number to the clinic [phone] and do not press any options, hold on the line and a nurse will answer the phone.    For prescription refill requests, have your pharmacy contact our office and  allow 72 hours.    Due to Covid, you will need to wear a mask upon entering the hospital. If you do not have a mask, a mask will be given to you at the Main Entrance upon arrival. For doctor visits, patients may have 1 support person age 2 or older with them. For treatment visits, patients can not have anyone with them due to social distancing guidelines and our immunocompromised population.

## 2022-12-06 ENCOUNTER — Other Ambulatory Visit: Payer: Self-pay

## 2022-12-13 ENCOUNTER — Other Ambulatory Visit: Payer: Self-pay | Admitting: Neurological Surgery

## 2022-12-13 ENCOUNTER — Telehealth: Payer: Self-pay | Admitting: Medical Oncology

## 2022-12-13 NOTE — Telephone Encounter (Signed)
[facility] 18007: RANDOMIZED [place] CONTROLLED TRIAL OF BUPROPION FOR CANCER RELATED FATIGUE   Outgoing call: 15:40  LVMOM with patient regarding final weeks of study enrollment. Informed patient that the saliva kit with corresponding questionnaires has been FedEx'd to her overnight. Patietn informed that she should start Saliva collection this Friday the 22nd, 23rd and 24th (Sunday, final collection) and to complete the questionnaires. Patient informed that I will need to schedule a lab appointment for study blood draw and then the week of December 9th, appointment with me for final study questionnaires and pill bottle/pill count collection. Patient thanked and encouraged to return call with questions. Call back number provided.   Rexene Edison, RN, BSN, CCRC Clinical Research Nurse Lead 12/13/2022 3:44 PM

## 2022-12-15 ENCOUNTER — Telehealth: Payer: Self-pay | Admitting: Medical Oncology

## 2022-12-15 NOTE — Telephone Encounter (Signed)
[facility] 18007: RANDOMIZED [place]ACE[place] CONTROLLED TRIAL OF BUPROPION FOR CANCER RELATED FATIGUE   Outgoing call: 100 Spoke with patient regarding study saliva kit sent via FedEx that should have been delivered yesterday. Per patient she had not received package yesterday and has not been home today. Informed patient that we will try to track it and see where it is at and I will call her tomorrow as to how to proceed. Patient denies questions at this time.  Patient thanked.   Per FedEx tracking, saliva kit package delivery expected this evening. Will continue to monitor.   Rexene Edison, RN, BSN, CCRC Clinical Research Nurse Lead 12/15/2022 3:50 PM

## 2022-12-16 ENCOUNTER — Telehealth: Payer: Self-pay | Admitting: Medical Oncology

## 2022-12-16 NOTE — Telephone Encounter (Signed)
[facility] 18007: RANDOMIZED [place]ACE[place] CONTROLLED TRIAL OF BUPROPION FOR CANCER RELATED FATIGUE   Outgoing call:15:20  Call to patient regarding final study assessments and patient's availability for completion of. Confirmed with patient that she had received the study saliva collection kit and the daily collection questionnaires, yesterday. Patient also confirms to have started saliva collection this morning and knows to store collection in refrigerator. Patient is available for specimen blood draw and week 12 questionnaires Tuesday morning (11/26) at 0730 and was also asked to bring her study dispensed study drug for count. Patient denies any questions at this time. Patient thanked and encouraged to call in the meantime.    Rexene Edison, RN, BSN, CCRC Clinical Research Nurse Lead 12/16/2022 3:22 PM

## 2022-12-19 ENCOUNTER — Telehealth: Payer: Self-pay | Admitting: Medical Oncology

## 2022-12-19 ENCOUNTER — Other Ambulatory Visit: Payer: Self-pay | Admitting: Medical Oncology

## 2022-12-19 DIAGNOSIS — Z170 Estrogen receptor positive status [ER+]: Secondary | ICD-10-CM

## 2022-12-19 NOTE — Telephone Encounter (Signed)
[facility] 18007: RANDOMIZED PLACEBO CONTROLLED TRIAL OF BUPROPION FOR CANCER RELATED FATIGUE   Outgoing call: 15:50  Spoke with patient regarding tomorrow morning's appointment with lab and that her appointment is at 0745 instead of 0730 as originally discussed, because the lab doesn't open until then. Patient gave her understanding and said it wasn't a problem. Reminded patient to bring completed saliva collection, questionnaires and study drug for count. Patient gave verbal understanding and denied having any questions. Patient thanked.   Rexene Edison, RN, BSN, CCRC Clinical Research Nurse Lead 12/19/2022 3:53 PM

## 2022-12-20 ENCOUNTER — Encounter: Payer: Self-pay | Admitting: Medical Oncology

## 2022-12-20 ENCOUNTER — Inpatient Hospital Stay: Payer: PRIVATE HEALTH INSURANCE

## 2022-12-20 DIAGNOSIS — Z170 Estrogen receptor positive status [ER+]: Secondary | ICD-10-CM

## 2022-12-20 DIAGNOSIS — C50411 Malignant neoplasm of upper-outer quadrant of right female breast: Secondary | ICD-10-CM

## 2022-12-20 NOTE — Progress Notes (Signed)
[facility] 18007: RANDOMIZED PLACEBO CONTROLLED TRIAL OF BUPROPION FOR CANCER RELATED FATIGUE   Study Visit Week 12   Met with patient this morning at [facility] for study visit week 12.  Patient reports to be doing well.  Labs: Study labs collected this morning upon patient's arrival via venepuncture. Patient states she had been fasting.  Saliva collection: Patient brought the completed saliva collection with her this morning. Patient confirms to have collected saliva for Friday (12/16/22), Saturday (12/17/22) and Sunday (12/18/22). Patient states that she had missed her day one sample 2 collection as well as the day three sample 2 collection, due to not being home during these times. Patient completed the study saliva diary/health behavior questionnaire for the three days of collection.   Adverse Events: Patient states that she has not experienced any side effects/adverse events for the past 12 weeks.   Week 12 Questionnaire: Completed this morning and checked for completion by this nurse.   Study Drug pill count: Patient brought in her study dispensed bupropion/placebo pills. Reviewed pill count this morning. Patient was dispensed 169 pills, at the start of study, patient confirms taking two pills this morning. As of today, patient has 22 pills remaining to continue taking until 01/02/2023. Patient reports that she may have missed one day, but isn't sure. Patient educated to stop on 12/9 and to continue documenting in her medication diary until the 9th of December.   Plan: Patient has thirteen more days to complete study drug treatment. Patient informed that I will follow up with her next week to confirm that she titrate's down to 1 pill a day starting on December 3rd -the 9th. Patient denied having any questions today, was thanked for her time and continued support of study. Patient encouraged to call should she have questions.    Rexene Edison, RN, BSN, CCRC Clinical Research Nurse  Lead 12/20/2022 9:49 AM

## 2022-12-26 ENCOUNTER — Encounter: Payer: Self-pay | Admitting: Hematology

## 2022-12-26 ENCOUNTER — Encounter (HOSPITAL_COMMUNITY): Payer: Self-pay | Admitting: Hematology

## 2022-12-27 ENCOUNTER — Telehealth: Payer: Self-pay | Admitting: Medical Oncology

## 2022-12-27 NOTE — Telephone Encounter (Signed)
URCC 18007: RANDOMIZED PLACEBO CONTROLLED TRIAL OF BUPROPION FOR CANCER RELATED FATIGUE   Outgoing call: 16:21  Call to patient to confirm that she has titrated bupropion/placebo down to one tablet. Patient confirms that this morning she started with one tablet and confirms to know to stop on December 9th. Patient thanked and encouraged to call with questions. Patient knows to expect call from me on the 9th.   Rexene Edison, RN, BSN, CCRC Clinical Research Nurse Lead 12/27/2022 4:24 PM

## 2023-01-02 ENCOUNTER — Ambulatory Visit: Payer: PRIVATE HEALTH INSURANCE | Attending: Family Medicine

## 2023-01-02 ENCOUNTER — Telehealth: Payer: Self-pay | Admitting: Medical Oncology

## 2023-01-02 VITALS — Wt 147.5 lb

## 2023-01-02 DIAGNOSIS — Z483 Aftercare following surgery for neoplasm: Secondary | ICD-10-CM

## 2023-01-02 NOTE — Therapy (Signed)
OUTPATIENT PHYSICAL THERAPY SOZO SCREENING NOTE   Patient Name: Lauren Ortega MRN: [id number] DOB:1980/08/28, 42 y.o., female Today's Date: 01/02/2023  PCP: Drema Halon, FNP REFERRING PROVIDER: Drema Halon, FNP   PT End of Session - 01/02/23 [phone]     Visit Number 7   # unchanged due to screen only   PT Start Time 0800    PT Stop Time 0805    PT Time Calculation (min) 5 min    Activity Tolerance Patient tolerated treatment well    Behavior During Therapy [facility] for tasks assessed/performed             Past Medical History:  Diagnosis Date   Anemia    Anxiety    Breast cancer (HCC) 03/2020   IDC grade 3 neg margins   Family history of colon cancer    Family history of lymphoma    Family history of pancreatic cancer    Family history of skin cancer    Heart murmur    Hypertension    Lumbar degenerative disc disease    Neuropathy due to chemotherapeutic drug (HCC)    Port-A-Cath in place 06/16/2020   Past Surgical History:  Procedure Laterality Date   BREAST LUMPECTOMY WITH RADIOACTIVE SEED AND SENTINEL LYMPH NODE BIOPSY Right 05/20/2020   Procedure: RIGHT BREAST LUMPECTOMY WITH RADIOACTIVE SEED AND SENTINEL LYMPH NODE BIOPSY;  Surgeon: Abigail Miyamoto, MD;  Location: [facility];  Service: General;  Laterality: Right;   CESAREAN SECTION     x3   PORT-A-CATH REMOVAL Left 06/19/2021   Procedure: REMOVAL PORT-A-CATH;  Surgeon: Diamantina Monks, MD;  [facility];  Service: General;  Laterality: Left;   PORTACATH PLACEMENT Left 05/20/2020   Procedure: INSERTION PORT-A-CATH;  Surgeon: Abigail Miyamoto, MD;  Location: [facility];  Service: General;  Laterality: Left;   TONSILLECTOMY     TUBAL LIGATION     Patient Active Problem List   Diagnosis Date Noted   Cellulitis 06/19/2021   HTN (hypertension) 06/19/2021   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) 06/19/2021   Anxiety and  depression 06/19/2021   Iron deficiency anemia 05/04/2021   Port-A-Cath in place 06/16/2020   Genetic testing 05/12/2020   Family history of pancreatic cancer    Family history of skin cancer    Family history of lymphoma    Family history of colon cancer    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 Magnus Ivan, HTN, DDD lumbar spine, Developed multiple bloodclots in LUE due to portacath which are now resolved.  Swelling from bloodclots has not resolved.   PRECAUTIONS: right UE Lymphedema risk, None  SUBJECTIVE: Pt returns for her 6 month L-Dex screen.    PAIN:  Are you having pain? No  SOZO SCREENING: Patient was assessed today using the SOZO machine to determine the lymphedema index score. This was compared to her baseline score. It was determined that she is within the recommended range when compared to her baseline and no further action is needed at this time. She will continue SOZO screenings. These are done every 3 months for 2 years post operatively followed by every 6 months for 2 years, and  then annually.   L-DEX FLOWSHEETS - 01/02/23 0900       L-DEX LYMPHEDEMA SCREENING   Measurement Type Unilateral    L-DEX MEASUREMENT EXTREMITY Upper Extremity    POSITION  Standing    DOMINANT SIDE Right    At Risk Side Right    BASELINE SCORE (UNILATERAL) 0.1    L-DEX SCORE (UNILATERAL) -2.4    VALUE CHANGE (UNILAT) -2.5              Hermenia Bers, PTA 01/02/2023, 9:05 AM

## 2023-01-02 NOTE — Telephone Encounter (Signed)
.  URCC 18007: RANDOMIZED PLACEBO CONTROLLED TRIAL OF BUPROPION FOR CANCER RELATED FATIGUE   Outgoing call: 15:31, Week 14  Call to patient to confirm that she had taken her last dosage of 1 bupropion/placebo. Patient confirms that she has taken her final tablet today and confirms to have 2 tablets left in the medication bottle. Patient.   Patient confirms no adverse events and states that she does feel that she has a slight increase in her energy, lessoned fatigue and reports to have felt a lessoned need for a nap.   Patient and I made plans to meet Friday, December the 13th at 0900 for me to collect the remaining 2 pills and her study medication diary. I thanked patient for her time and contribution to the study and encouraged her to call with questions in the meantime.   Rexene Edison, RN, BSN, CCRC Clinical Research Nurse Lead 01/02/2023 3:48 PM

## 2023-01-03 ENCOUNTER — Other Ambulatory Visit: Payer: Self-pay

## 2023-01-05 ENCOUNTER — Encounter (HOSPITAL_COMMUNITY): Payer: Self-pay | Admitting: Hematology

## 2023-01-05 ENCOUNTER — Encounter: Payer: Self-pay | Admitting: Hematology

## 2023-01-05 ENCOUNTER — Other Ambulatory Visit: Payer: Self-pay | Admitting: Hematology

## 2023-01-06 ENCOUNTER — Other Ambulatory Visit: Payer: Self-pay | Admitting: Medical Oncology

## 2023-01-06 ENCOUNTER — Encounter: Payer: Self-pay | Admitting: Medical Oncology

## 2023-01-06 DIAGNOSIS — Z170 Estrogen receptor positive status [ER+]: Secondary | ICD-10-CM

## 2023-01-06 NOTE — Progress Notes (Signed)
URCC 18007: RANDOMIZED PLACEBO CONTROLLED TRIAL OF BUPROPION FOR CANCER RELATED FATIGUE   Met with patient this morning to collect patient's medication diary and study drug bupropion/placebo. Patient provided me with study dispensed drug, with two capsules remaining, confirmed with Ebony Hail PharmD. These have been submitted to pharmacy to return to IDS for documentation of return.   Medication diary: Patient documented during week 5, day three, "don't remember" if she took the two capsules for that day. It was also noted, that patient missed labeled her weeks on calendar. For the start of Week 10, patient wrote "11/12" and then on the start of Week 11 wrote "11/26." This study provided medication diary has no space to write or add dates, so research did not write these in. Patient was educated to start on September 10th and take final dose of 1 capsule the morning of December 9th.   Bupropion/placebo: Patient was dispensed 169 capsules (see 10/03/22 note) and returned two this morning. Per study dispensed medication diary, 168 capsules are scheduled to be taken.   Adverse events: Patient confirmed that she has had no adverse events.  Questionnaires: Informed patient that there were a few questionnaires missed when we met at the 12 week visit. Inquired with patient if it was alright to email her the missed questionnaires via the study provided link, patient gave verbal consent to having them emailed to her, confirmed her email address and patient stated she would complete those this afternoon.   Plan: Patient was informed that research will follow-up with her next week to confirm that she has had no adverse events, one week out from taking bupropion/placebo. Patient gave her verbal understanding.   Patient denied having any questions and was thanked for her time and contribution to the study, and was encouraged to call research with questions/concerns.   Rexene Edison, RN, BSN, [facility] Clinical  Research Nurse Lead 01/06/2023 11:52 AM

## 2023-01-06 NOTE — Pre-Procedure Instructions (Signed)
Surgical Instructions   Your procedure is scheduled on January 13, 2023. Report to [facility] Main Entrance "A" at 5:30 A.M., then check in with the Admitting office. Any questions or running late day of surgery: call [phone]  Questions prior to your surgery date: call [phone], Monday-Friday, 8am-4pm. If you experience any cold or flu symptoms such as cough, fever, chills, shortness of breath, etc. between now and your scheduled surgery, please notify us at the above number.     Remember:  Do not eat or drink after midnight the night before your surgery    Take these medicines the morning of surgery with A SIP OF WATER: amLODipine (NORVASC)  anastrozole (ARIMIDEX)    One week prior to surgery, STOP taking any Aspirin (unless otherwise instructed by your surgeon) Aleve, Naproxen, Ibuprofen, Motrin, Advil, Goody's, BC's, all herbal medications, fish oil, and non-prescription vitamins.                     Do NOT Smoke (Tobacco/Vaping) for 24 hours prior to your procedure.  If you use a CPAP at night, you may bring your mask/headgear for your overnight stay.   You will be asked to remove any contacts, glasses, piercing's, hearing aid's, dentures/partials prior to surgery. Please bring cases for these items if needed.    Patients discharged the day of surgery will not be allowed to drive home, and someone needs to stay with them for 24 hours.  SURGICAL WAITING ROOM VISITATION Patients may have no more than 2 support people in the waiting area - these visitors may rotate.   Pre-op nurse will coordinate an appropriate time for 1 ADULT support person, who may not rotate, to accompany patient in pre-op.  Children under the age of 43 must have an adult with them who is not the patient and must remain in the main waiting area with an adult.  If the patient needs to stay at the hospital during part of their recovery, the visitor guidelines for inpatient rooms apply.  Please refer  to the [facility] website for the visitor guidelines for any additional information.   If you received a COVID test during your pre-op visit  it is requested that you wear a mask when out in public, stay away from anyone that may not be feeling well and notify your surgeon if you develop symptoms. If you have been in contact with anyone that has tested positive in the last 10 days please notify you surgeon.      Pre-operative 5 CHG Bathing Instructions   You can play a key role in reducing the risk of infection after surgery. Your skin needs to be as free of germs as possible. You can reduce the number of germs on your skin by washing with CHG (chlorhexidine gluconate) soap before surgery. CHG is an antiseptic soap that kills germs and continues to kill germs even after washing.   DO NOT use if you have an allergy to chlorhexidine/CHG or antibacterial soaps. If your skin becomes reddened or irritated, stop using the CHG and notify one of our RNs at [phone].   Please shower with the CHG soap starting 4 days before surgery using the following schedule:     Please keep in mind the following:  DO NOT shave, including legs and underarms, starting the day of your first shower.   You may shave your face at any point before/day of surgery.  Place clean sheets on your bed the day you  start using CHG soap. Use a clean washcloth (not used since being washed) for each shower. DO NOT sleep with pets once you start using the CHG.   CHG Shower Instructions:  Wash your face and private area with normal soap. If you choose to wash your hair, wash first with your normal shampoo.  After you use shampoo/soap, rinse your hair and body thoroughly to remove shampoo/soap residue.  Turn the water OFF and apply about 3 tablespoons (45 ml) of CHG soap to a CLEAN washcloth.  Apply CHG soap ONLY FROM YOUR NECK DOWN TO YOUR TOES (washing for 3-5 minutes)  DO NOT use CHG soap on face, private areas, open  wounds, or sores.  Pay special attention to the area where your surgery is being performed.  If you are having back surgery, having someone wash your back for you may be helpful. Wait 2 minutes after CHG soap is applied, then you may rinse off the CHG soap.  Pat dry with a clean towel  Put on clean clothes/pajamas   If you choose to wear lotion, please use ONLY the CHG-compatible lotions on the back of this paper.   Additional instructions for the day of surgery: DO NOT APPLY any lotions, deodorants, cologne, or perfumes.   Do not bring valuables to the hospital. [facility] is not responsible for any belongings/valuables. Do not wear nail polish, gel polish, artificial nails, or any other type of covering on natural nails (fingers and toes) Do not wear jewelry or makeup Put on clean/comfortable clothes.  Please brush your teeth.  Ask your nurse before applying any prescription medications to the skin.     CHG Compatible Lotions   Aveeno Moisturizing lotion  Cetaphil Moisturizing Cream  Cetaphil Moisturizing Lotion  Clairol Herbal Essence Moisturizing Lotion, Dry Skin  Clairol Herbal Essence Moisturizing Lotion, Extra Dry Skin  Clairol Herbal Essence Moisturizing Lotion, Normal Skin  Curel Age Defying Therapeutic Moisturizing Lotion with Alpha Hydroxy  Curel Extreme Care Body Lotion  Curel Soothing Hands Moisturizing Hand Lotion  Curel Therapeutic Moisturizing Cream, Fragrance-Free  Curel Therapeutic Moisturizing Lotion, Fragrance-Free  Curel Therapeutic Moisturizing Lotion, Original Formula  Eucerin Daily Replenishing Lotion  Eucerin Dry Skin Therapy Plus Alpha Hydroxy Crme  Eucerin Dry Skin Therapy Plus Alpha Hydroxy Lotion  Eucerin Original Crme  Eucerin Original Lotion  Eucerin Plus Crme Eucerin Plus Lotion  Eucerin TriLipid Replenishing Lotion  Keri Anti-Bacterial Hand Lotion  Keri Deep Conditioning Original Lotion Dry Skin Formula Softly Scented  Keri Deep  Conditioning Original Lotion, Fragrance Free Sensitive Skin Formula  Keri Lotion Fast Absorbing Fragrance Free Sensitive Skin Formula  Keri Lotion Fast Absorbing Softly Scented Dry Skin Formula  Keri Original Lotion  Keri Skin Renewal Lotion Keri Silky Smooth Lotion  Keri Silky Smooth Sensitive Skin Lotion  Nivea Body Creamy Conditioning Oil  Nivea Body Extra Enriched Lotion  Nivea Body Original Lotion  Nivea Body Sheer Moisturizing Lotion Nivea Crme  Nivea Skin Firming Lotion  NutraDerm 30 Skin Lotion  NutraDerm Skin Lotion  NutraDerm Therapeutic Skin Cream  NutraDerm Therapeutic Skin Lotion  ProShield Protective Hand Cream  Provon moisturizing lotion  Please read over the following fact sheets that you were given.

## 2023-01-09 ENCOUNTER — Encounter: Payer: Self-pay | Admitting: Hematology

## 2023-01-09 ENCOUNTER — Inpatient Hospital Stay (HOSPITAL_COMMUNITY)
Admission: RE | Admit: 2023-01-09 | Discharge: 2023-01-09 | Disposition: A | Discharge status: Home / Self Care | Payer: PRIVATE HEALTH INSURANCE | Source: Ambulatory Visit | Attending: Neurological Surgery

## 2023-01-09 ENCOUNTER — Encounter (HOSPITAL_COMMUNITY): Payer: Self-pay | Admitting: Hematology

## 2023-01-09 ENCOUNTER — Other Ambulatory Visit: Payer: Self-pay

## 2023-01-09 ENCOUNTER — Encounter (HOSPITAL_COMMUNITY): Payer: Self-pay

## 2023-01-09 VITALS — BP 121/82 | HR 79 | Temp 98.5°F | Resp 18 | Ht 60.0 in | Wt 145.4 lb

## 2023-01-09 DIAGNOSIS — I2510 Atherosclerotic heart disease of native coronary artery without angina pectoris: Secondary | ICD-10-CM | POA: Diagnosis not present

## 2023-01-09 DIAGNOSIS — I447 Left bundle-branch block, unspecified: Secondary | ICD-10-CM | POA: Diagnosis not present

## 2023-01-09 DIAGNOSIS — Z01818 Encounter for other preprocedural examination: Secondary | ICD-10-CM | POA: Diagnosis present

## 2023-01-09 DIAGNOSIS — M48061 Spinal stenosis, lumbar region without neurogenic claudication: Secondary | ICD-10-CM | POA: Insufficient documentation

## 2023-01-09 DIAGNOSIS — Z853 Personal history of malignant neoplasm of breast: Secondary | ICD-10-CM | POA: Insufficient documentation

## 2023-01-09 DIAGNOSIS — Z86718 Personal history of other venous thrombosis and embolism: Secondary | ICD-10-CM | POA: Insufficient documentation

## 2023-01-09 HISTORY — DX: Depression, unspecified: F32.A

## 2023-01-09 LAB — CBC
HCT: 43.2 % (ref 36.0–46.0)
Hemoglobin: 14.4 g/dL (ref 12.0–15.0)
MCH: 30.5 pg (ref 26.0–34.0)
MCHC: 33.3 g/dL (ref 30.0–36.0)
MCV: 91.5 fL (ref 80.0–100.0)
Platelets: 412 10*3/uL — ABNORMAL HIGH (ref 150–400)
RBC: 4.72 MIL/uL (ref 3.87–5.11)
RDW: 12.9 % (ref 11.5–15.5)
WBC: 8 10*3/uL (ref 4.0–10.5)
nRBC: 0 % (ref 0.0–0.2)

## 2023-01-09 LAB — BASIC METABOLIC PANEL
Anion gap: 9 (ref 5–15)
BUN: 10 mg/dL (ref 6–20)
CO2: 27 mmol/L (ref 22–32)
Calcium: 9.6 mg/dL (ref 8.9–10.3)
Chloride: 103 mmol/L (ref 98–111)
Creatinine, Ser: 0.81 mg/dL (ref 0.44–1.00)
GFR, Estimated: >60 mL/min (ref >60)
Glucose, Bld: 89 mg/dL (ref 70–99)
Potassium: 3.3 mmol/L — ABNORMAL LOW (ref 3.5–5.1)
Sodium: 139 mmol/L (ref 135–145)

## 2023-01-09 LAB — PROTIME-INR
INR: 0.9 (ref 0.8–1.2)
Prothrombin Time: 12.7 s (ref 11.4–15.2)

## 2023-01-09 LAB — SURGICAL PCR SCREEN
MRSA, PCR: NEGATIVE
Staphylococcus aureus: NEGATIVE

## 2023-01-09 NOTE — Progress Notes (Addendum)
Anesthesia PAT APP Evaluation:  Case: [id number] Date/Time: 01/13/23 0715   Procedure: Left - L3-L4 - L4-L5 with sublaminar decompression (Left: Back) - 3C   Anesthesia type: General   Pre-op diagnosis: Stenosis   [facility] ROOM 20 / [facility] OR   Surgeons: Arman Bogus, MD       DISCUSSION: Patient is a 42 year old female scheduled for the above procedure.  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  She was in a randomized placebo controlled trial of bupropion for cancer related fatigue, but says the study is now completed. Last dose 01/02/23.   She was noted to have a left BBB on preoperative (lumpectomy) EKG on 05/19/20. It appears this was prior to starting chemotherapy. She had a pre-chemotherapy echo on 06/02/20 that showed LVEF 55-60%, no regional wall motion abnormalities, normal RV systolic pressure, RVSP 30.5 mmHg, trivial MR. Follow-up echo on 08/18/20 showed LVEF 50-55% with global LV hypokinesis. Last echo on 02/24/21 showed LVEF 50-55%, no regional wall motion abnormalities, normal RV systolic function, normal PASP, mildly dilated LA.  She says she was never referred to cardio-oncology. She is no longer on chemotherapy. She says until 6-12 months ago, she was hiking/backpacking. 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 She denied any syncope, dizziness, chest pain, dyspnea, edema, and palpitations.  01/09/23 EKG showed LBBB  which is known as above. She denied seeing cardiology, but has had multiple echocardiograms through oncology. She denied any CV/HF symptoms. Heart RRR. No murmur heard on exam. Lung clear. No ankle edema. No carotid bruits noted.   GYN status is reported as post-menopausal.  Anesthesia team to evaluate on the day of surgery. Reviewed with anesthesiologist Leslye Peer, MD.    VS: BP 121/82   Pulse 79   Temp 36.9 C   Resp 18   Ht 5' (1.524 m)   Wt 66 kg   LMP 05/17/2020 (Exact Date)   SpO2 98%   BMI 28.40 kg/m   PROVIDERS: Drema Halon, FNP is PCP (through [facility] through the [facility], [place]) Doreatha Massed, MD is HEM-ONC  LABS: Labs reviewed: Acceptable for surgery. LFTs normal on 11/25/22. (all labs ordered are listed, but only abnormal results are displayed)  Labs Reviewed  BASIC METABOLIC PANEL - Abnormal; Notable for the following components:      Result Value   Potassium 3.3 (*)    All other components within normal limits  CBC - Abnormal; Notable for the following components:   Platelets 412 (*)    All other components within normal limits  SURGICAL PCR SCREEN  PROTIME-INR     IMAGES: MRI Abd 04/20/22: IMPRESSION: 1. Slight interval decrease in size of lesion in the posterior inferior right lobe of the liver, hepatic segment VI, now measuring 2.6 x 2.0 cm, previously 3.3 x 2.6 cm. This lesion demonstrates significant early arterial hyperenhancement, with fade to parenchymal background on multiphasic imaging. Pattern of contrast enhancement and underlying T2 hyperintensity is significantly different than that seen on prior examinations, which demonstrated a  much more heterogeneous intrinsic signal and contrast enhancement. 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  Specifically, appearance is not particularly suggestive of a metastasis. In the setting of known primary malignancy, recommend continued surveillance in 6 months by Eovist enhanced MRI. 2. No specific evidence of lymphadenopathy or evidence of metastatic disease in the abdomen.    EKG: EKG 01/09/23: Normal sinus rhythm at 77 bpm Left bundle branch block (QRS 126 ms) Abnormal ECG When compared with ECG of 19-Jun-2021 05:07, PREVIOUS ECG IS PRESENT  EKG 06/22/21: Sinus rhythm Incomplete left bundle branch block (QRS 118 ms) ST depression, consider inferior ischemia Baseline wander in V1, V2, aVL  EKG 05/19/20 12:40: Normal sinus rhythm Left bundle branch block (QRS 126 ms) Abnormal ECG since last tracing no significant change (05/19/20 12:38) Confirmed by Lance Muss [phone]) on 05/19/2020 5:54:19 PM   CV: Echo 02/24/21: IMPRESSIONS   1. Left ventricular ejection fraction, by estimation, is 50 to 55%. The  left ventricle has low normal function. The left ventricle has no regional  wall motion abnormalities. Left ventricular diastolic parameters were  normal.   2. Right ventricular systolic function is normal. The right ventricular  size is normal. There is normal pulmonary artery systolic pressure.   3. Left atrial size was mildly dilated.   4. The mitral valve is normal in structure. No evidence of mitral valve  regurgitation. No evidence of mitral stenosis.   5. The aortic valve has an indeterminant number of cusps. Aortic valve  regurgitation is not visualized. No aortic stenosis is present.   6. The inferior vena cava is normal in size with greater than 50%  respiratory variability, suggesting right atrial pressure of 3 mmHg.  - Comparison: LVEF 55-60%, no RWMA 06/02/20; LVEF 50-55%, LV global hypokinesis, normal RVSF, normal PASP 08/18/20; LVEF 50-55%, no RWMA 11/23/20   Past Medical History:  Diagnosis Date   Anemia    Anxiety    Breast cancer (HCC) 03/2020    IDC grade 3 neg margins - Right   Depression    DVT (deep venous thrombosis) (HCC) 07/06/2020   left axillary/subclavian DVT in setting of left [place] Port-a-cath, discontinued 06/19/21   Family history of colon cancer    Family history of lymphoma    Family history of pancreatic cancer    Family history of skin cancer    Heart murmur    Hypertension    Left bundle branch block (LBBB) 05/19/2020   Lumbar degenerative disc disease    Neuropathy due to chemotherapeutic drug [facility])     Past Surgical History:  Procedure Laterality Date   BREAST LUMPECTOMY WITH RADIOACTIVE SEED AND SENTINEL LYMPH NODE BIOPSY Right 05/20/2020   Procedure: RIGHT BREAST LUMPECTOMY WITH RADIOACTIVE SEED AND SENTINEL LYMPH NODE BIOPSY;  Surgeon: Abigail Miyamoto, MD;  Location: [facility];  Service: General;  Laterality: Right;   CESAREAN SECTION     x3   PORT-A-CATH REMOVAL Left 06/19/2021   Procedure: REMOVAL PORT-A-CATH;  Surgeon: Diamantina Monks, MD;  Location: [facility] OR;  Service: General;  Laterality: Left;   PORTACATH PLACEMENT Left 05/20/2020   Procedure: INSERTION PORT-A-CATH;  Surgeon: Abigail Miyamoto, MD;  Location: [facility];  Service: General;  Laterality: Left;   TONSILLECTOMY     TUBAL LIGATION      MEDICATIONS:  acetaminophen (TYLENOL) 500 MG tablet   amLODipine (  NORVASC) 10 MG tablet   anastrozole (ARIMIDEX) 1 MG tablet   Cholecalciferol (VITAMIN D3 PO)   cyanocobalamin (VITAMIN B12) 1000 MCG tablet   DULoxetine (CYMBALTA) 30 MG capsule   estradiol (ESTRACE) 0.1 MG/GM vaginal cream   ferrous sulfate 325 (65 FE) MG EC tablet   folic acid (FOLVITE) 1 MG tablet   ibuprofen (ADVIL) 200 MG tablet   ibuprofen (ADVIL) 600 MG tablet   [id number] bupropion xl/placebo 150 MG capsule   magnesium oxide (MAG-OX) 400 (240 Mg) MG tablet   No current facility-administered medications for this encounter.    Shonna Chock, PA-C Surgical Short Stay/Anesthesiology [facility]  Phone [phone] [facility] Phone [phone] 01/09/2023 6:24 PM

## 2023-01-09 NOTE — Anesthesia Preprocedure Evaluation (Addendum)
Anesthesia Evaluation  Patient identified by MRN, date of birth, ID band Patient awake    Reviewed: Allergy & Precautions, NPO status , Patient's Chart, lab work & pertinent test results  History of Anesthesia Complications Negative for: history of anesthetic complications  Airway Mallampati: IV  TM Distance: <3 FB Neck ROM: Full    Dental  (+) Teeth Intact, Dental Advisory Given   Pulmonary neg shortness of breath, neg sleep apnea, neg COPD, neg recent URI   breath sounds clear to auscultation       Cardiovascular hypertension, Pt. on medications (-) angina (-) Past MI and (-) CHF  Rhythm:Regular     Neuro/Psych  Neuromuscular disease    GI/Hepatic negative GI ROS, Neg liver ROS,,,  Endo/Other  negative endocrine ROS    Renal/GU negative Renal ROS     Musculoskeletal  (+) Arthritis ,    Abdominal   Peds  Hematology negative hematology ROS (+) Lab Results      Component                Value               Date                      WBC                      8.0                 01/09/2023                HGB                      14.4                01/09/2023                HCT                      43.2                01/09/2023                MCV                      91.5                01/09/2023                PLT                      412 (H)             01/09/2023              Anesthesia Other Findings   Reproductive/Obstetrics                             Anesthesia Physical Anesthesia Plan  ASA: 2  Anesthesia Plan: General   Post-op Pain Management: Tylenol PO (pre-op)* and Gabapentin PO (pre-op)*   Induction: Intravenous  PONV Risk Score and Plan: 4 or greater and Ondansetron and Dexamethasone  Airway Management Planned: Oral ETT and Video Laryngoscope Planned  Additional Equipment: None  Intra-op Plan:   Post-operative Plan: Extubation in OR  Informed Consent: I have  reviewed the patients History and Physical, chart,  labs and discussed the procedure including the risks, benefits and alternatives for the proposed anesthesia with the patient or authorized representative who has indicated his/her understanding and acceptance.     Dental advisory given  Plan Discussed with: CRNA  Anesthesia Plan Comments: (PAT note written 01/09/2023 by Shonna Chock, PA-C.  )       Anesthesia Quick Evaluation

## 2023-01-09 NOTE — Progress Notes (Addendum)
PCP - Aida Puffer, FNP Cardiologist - Denies Oncologist - Dr. Doreatha Massed  PPM/ICD - Denies Device Orders - n/a Rep Notified - n/a  Chest x-ray - n/a EKG - 01/09/2023 Stress Test - Denies ECHO - 02/24/2021 - chemotherapy (Right Breast CA in 2023) Cardiac Cath - Denies  Sleep Study - Denies CPAP - n/a  No DM  Last dose of GLP1 agonist- n/a GLP1 instructions: n/a  Blood Thinner Instructions: n/a Aspirin Instructions: n/a  NPO after midnight  COVID TEST- n/a   Anesthesia review: Yes. Abnormal EKG. Pt mentioned that while on chemotherapy she had to get an EKG every three months. Discussed with Shonna Chock, PA-C. Pt also noted that she experienced blood clots after her port was inserted and placed on Eliquis. She has since had port removed and has stopped Eliquis  Patient denies shortness of breath, fever, cough and chest pain at PAT appointment. Pt denies any respiratory illness/infection in the last two months.   All instructions explained to the patient, with a verbal understanding of the material. Patient agrees to go over the instructions while at home for a better understanding. Patient also instructed to self quarantine after being tested for COVID-19. The opportunity to ask questions was provided.

## 2023-01-10 ENCOUNTER — Telehealth: Payer: Self-pay

## 2023-01-10 DIAGNOSIS — Z170 Estrogen receptor positive status [ER+]: Secondary | ICD-10-CM

## 2023-01-10 NOTE — Telephone Encounter (Signed)
Patient returned call to research nurse. She denies any side effects from her study drug. She has already returned her medication diaries and study drug per Hale Drone, RN last week. Patient states that she hasn't received her questionnaires from the study yet. Research nurse will go on [employer] and attempt to re-send them to the patient today.  Patient was encouraged to call if she has any questions, concerns or still doesn't receive her questionnaires to complete.  Chriss Driver, RN 01/10/23 2:52 PM

## 2023-01-10 NOTE — Telephone Encounter (Signed)
[facility] 18007: RANDOMIZED [place]ACE[place] CONTROLLED TRIAL OF BUPROPION FOR CANCER RELATED FATIGUE   14 Week Follow Up Call:  Research nurse attempted to contact patient via secure mobile phone number. Message left for patient to return call to complete the follow up questions for study completion.  Chriss Driver, RN 01/10/23 2:36 PM

## 2023-01-13 ENCOUNTER — Ambulatory Visit (HOSPITAL_COMMUNITY): Payer: PRIVATE HEALTH INSURANCE

## 2023-01-13 ENCOUNTER — Encounter (HOSPITAL_COMMUNITY)
Admission: RE | Disposition: A | Discharge status: Home / Self Care | Payer: Self-pay | Source: Home / Self Care | Attending: Neurological Surgery

## 2023-01-13 ENCOUNTER — Ambulatory Visit (HOSPITAL_BASED_OUTPATIENT_CLINIC_OR_DEPARTMENT_OTHER): Payer: PRIVATE HEALTH INSURANCE

## 2023-01-13 ENCOUNTER — Other Ambulatory Visit: Payer: Self-pay

## 2023-01-13 ENCOUNTER — Observation Stay (HOSPITAL_COMMUNITY)
Admission: RE | Admit: 2023-01-13 | Discharge: 2023-01-13 | Disposition: A | Discharge status: Home / Self Care | Payer: PRIVATE HEALTH INSURANCE | Attending: Neurological Surgery | Admitting: Neurological Surgery

## 2023-01-13 ENCOUNTER — Ambulatory Visit (HOSPITAL_COMMUNITY): Payer: PRIVATE HEALTH INSURANCE | Admitting: Vascular Surgery

## 2023-01-13 ENCOUNTER — Encounter (HOSPITAL_COMMUNITY): Payer: Self-pay | Admitting: Neurological Surgery

## 2023-01-13 DIAGNOSIS — Z853 Personal history of malignant neoplasm of breast: Secondary | ICD-10-CM | POA: Insufficient documentation

## 2023-01-13 DIAGNOSIS — M5416 Radiculopathy, lumbar region: Secondary | ICD-10-CM | POA: Diagnosis not present

## 2023-01-13 DIAGNOSIS — I10 Essential (primary) hypertension: Secondary | ICD-10-CM | POA: Insufficient documentation

## 2023-01-13 DIAGNOSIS — Z86718 Personal history of other venous thrombosis and embolism: Secondary | ICD-10-CM | POA: Diagnosis not present

## 2023-01-13 DIAGNOSIS — M48061 Spinal stenosis, lumbar region without neurogenic claudication: Secondary | ICD-10-CM | POA: Diagnosis present

## 2023-01-13 DIAGNOSIS — Z9889 Other specified postprocedural states: Principal | ICD-10-CM

## 2023-01-13 DIAGNOSIS — Z79899 Other long term (current) drug therapy: Secondary | ICD-10-CM | POA: Diagnosis not present

## 2023-01-13 HISTORY — PX: LUMBAR LAMINECTOMY/DECOMPRESSION MICRODISCECTOMY: SHX5026

## 2023-01-13 SURGERY — LUMBAR LAMINECTOMY/DECOMPRESSION MICRODISCECTOMY 2 LEVELS
Anesthesia: General | Site: Back | Laterality: Left

## 2023-01-13 MED ORDER — SODIUM CHLORIDE 0.9 % IV SOLN: INTRAVENOUS | Status: DC | PRN

## 2023-01-13 MED ORDER — PROPOFOL 10 MG/ML IV BOLUS
INTRAVENOUS | Status: DC | PRN
  Administered 2023-01-13: 130 mg via INTRAVENOUS
  Administered 2023-01-13: 50 mg via INTRAVENOUS

## 2023-01-13 MED ORDER — BUPIVACAINE HCL (PF) 0.25 % IJ SOLN
INTRAMUSCULAR | Status: AC
  Filled 2023-01-13: qty 30

## 2023-01-13 MED ORDER — CHLORHEXIDINE GLUCONATE CLOTH 2 % EX PADS: 6.0000 | MEDICATED_PAD | Freq: Once | CUTANEOUS | Status: DC

## 2023-01-13 MED ORDER — OXYCODONE HCL 5 MG PO TABS
ORAL_TABLET | ORAL | Status: AC
  Filled 2023-01-13: qty 1

## 2023-01-13 MED ORDER — THROMBIN 5000 UNITS EX SOLR
CUTANEOUS | Status: AC
  Filled 2023-01-13: qty 15000

## 2023-01-13 MED ORDER — ACETAMINOPHEN 500 MG PO TABS: 1000.0000 mg | ORAL_TABLET | Freq: Once | ORAL | Status: DC | PRN

## 2023-01-13 MED ORDER — ONDANSETRON HCL 4 MG/2ML IJ SOLN
INTRAMUSCULAR | Status: DC | PRN
  Administered 2023-01-13: 4 mg via INTRAVENOUS

## 2023-01-13 MED ORDER — LIDOCAINE 2% (20 MG/ML) 5 ML SYRINGE
INTRAMUSCULAR | Status: DC | PRN
  Administered 2023-01-13: 40 mg via INTRAVENOUS

## 2023-01-13 MED ORDER — ROCURONIUM BROMIDE 10 MG/ML (PF) SYRINGE
PREFILLED_SYRINGE | INTRAVENOUS | Status: DC | PRN
  Administered 2023-01-13: 60 mg via INTRAVENOUS

## 2023-01-13 MED ORDER — PROPOFOL 10 MG/ML IV BOLUS
INTRAVENOUS | Status: AC
  Filled 2023-01-13: qty 20

## 2023-01-13 MED ORDER — ACETAMINOPHEN 160 MG/5ML PO SOLN: 1000.0000 mg | Freq: Once | ORAL | Status: DC | PRN

## 2023-01-13 MED ORDER — GABAPENTIN 300 MG PO CAPS
300.0000 mg | ORAL_CAPSULE | ORAL | Status: AC
  Administered 2023-01-13: 300 mg via ORAL
  Filled 2023-01-13: qty 1

## 2023-01-13 MED ORDER — ROCURONIUM BROMIDE 10 MG/ML (PF) SYRINGE
PREFILLED_SYRINGE | INTRAVENOUS | Status: AC
  Filled 2023-01-13: qty 10

## 2023-01-13 MED ORDER — DEXAMETHASONE SODIUM PHOSPHATE 10 MG/ML IJ SOLN
INTRAMUSCULAR | Status: AC
  Filled 2023-01-13: qty 1

## 2023-01-13 MED ORDER — FENTANYL CITRATE (PF) 100 MCG/2ML IJ SOLN: 25.0000 ug | INTRAMUSCULAR | Status: DC | PRN

## 2023-01-13 MED ORDER — BUPIVACAINE HCL (PF) 0.25 % IJ SOLN
INTRAMUSCULAR | Status: DC | PRN
  Administered 2023-01-13: 6 mL

## 2023-01-13 MED ORDER — FENTANYL CITRATE (PF) 250 MCG/5ML IJ SOLN
INTRAMUSCULAR | Status: AC
  Filled 2023-01-13: qty 5

## 2023-01-13 MED ORDER — OXYCODONE HCL 5 MG PO TABS
5.0000 mg | ORAL_TABLET | Freq: Once | ORAL | Status: AC | PRN
Start: 2023-01-13 — End: 2023-01-13
  Administered 2023-01-13: 5 mg via ORAL

## 2023-01-13 MED ORDER — LIDOCAINE 2% (20 MG/ML) 5 ML SYRINGE
INTRAMUSCULAR | Status: AC
  Filled 2023-01-13: qty 5

## 2023-01-13 MED ORDER — MIDAZOLAM HCL 2 MG/2ML IJ SOLN
INTRAMUSCULAR | Status: AC
  Filled 2023-01-13: qty 2

## 2023-01-13 MED ORDER — SUGAMMADEX SODIUM 200 MG/2ML IV SOLN
INTRAVENOUS | Status: DC | PRN
  Administered 2023-01-13: 200 mg via INTRAVENOUS

## 2023-01-13 MED ORDER — PHENYLEPHRINE 80 MCG/ML (10ML) SYRINGE FOR IV PUSH (FOR BLOOD PRESSURE SUPPORT)
PREFILLED_SYRINGE | INTRAVENOUS | Status: DC | PRN
  Administered 2023-01-13 (×3): 80 ug via INTRAVENOUS

## 2023-01-13 MED ORDER — ACETAMINOPHEN 10 MG/ML IV SOLN: 1000.0000 mg | Freq: Once | INTRAVENOUS | Status: DC | PRN

## 2023-01-13 MED ORDER — MIDAZOLAM HCL 2 MG/2ML IJ SOLN
INTRAMUSCULAR | Status: DC | PRN
  Administered 2023-01-13: 2 mg via INTRAVENOUS

## 2023-01-13 MED ORDER — OXYCODONE HCL 5 MG/5ML PO SOLN: 5.0000 mg | Freq: Once | ORAL | Status: AC | PRN

## 2023-01-13 MED ORDER — DEXAMETHASONE SODIUM PHOSPHATE 10 MG/ML IJ SOLN
INTRAMUSCULAR | Status: DC | PRN
  Administered 2023-01-13: 10 mg via INTRAVENOUS

## 2023-01-13 MED ORDER — CHLORHEXIDINE GLUCONATE 0.12 % MT SOLN
15.0000 mL | Freq: Once | OROMUCOSAL | Status: AC
  Administered 2023-01-13: 15 mL via OROMUCOSAL
  Filled 2023-01-13: qty 15

## 2023-01-13 MED ORDER — ORAL CARE MOUTH RINSE: 15.0000 mL | Freq: Once | OROMUCOSAL | Status: AC

## 2023-01-13 MED ORDER — LACTATED RINGERS IV SOLN: INTRAVENOUS | Status: DC

## 2023-01-13 MED ORDER — 0.9 % SODIUM CHLORIDE (POUR BTL) OPTIME
TOPICAL | Status: DC | PRN
  Administered 2023-01-13: 1000 mL

## 2023-01-13 MED ORDER — FENTANYL CITRATE (PF) 250 MCG/5ML IJ SOLN
INTRAMUSCULAR | Status: DC | PRN
  Administered 2023-01-13: 50 ug via INTRAVENOUS
  Administered 2023-01-13: 100 ug via INTRAVENOUS
  Administered 2023-01-13: 50 ug via INTRAVENOUS

## 2023-01-13 MED ORDER — ONDANSETRON HCL 4 MG/2ML IJ SOLN
INTRAMUSCULAR | Status: AC
  Filled 2023-01-13: qty 2

## 2023-01-13 MED ORDER — THROMBIN (RECOMBINANT) 5000 UNITS EX SOLR
CUTANEOUS | Status: DC | PRN
  Administered 2023-01-13: 5 mL via TOPICAL

## 2023-01-13 MED ORDER — ACETAMINOPHEN 500 MG PO TABS
1000.0000 mg | ORAL_TABLET | ORAL | Status: AC
  Administered 2023-01-13: 1000 mg via ORAL
  Filled 2023-01-13: qty 2

## 2023-01-13 MED ORDER — CEFAZOLIN SODIUM-DEXTROSE 2-4 GM/100ML-% IV SOLN
2.0000 g | INTRAVENOUS | Status: AC
  Administered 2023-01-13: 2 g via INTRAVENOUS
  Filled 2023-01-13: qty 100

## 2023-01-13 SURGICAL SUPPLY — 36 items
BAG COUNTER SPONGE SURGICOUNT (BAG) ×1 IMPLANT
BENZOIN TINCTURE PRP APPL 2/3 (GAUZE/BANDAGES/DRESSINGS) ×1 IMPLANT
BUR CARBIDE MATCH 3.0 (BURR) ×1 IMPLANT
CANISTER SUCT 3000ML PPV (MISCELLANEOUS) ×1 IMPLANT
DRAPE LAPAROTOMY 100X72X124 (DRAPES) ×1 IMPLANT
DRAPE MICROSCOPE SLANT 54X150 (MISCELLANEOUS) ×1 IMPLANT
DRAPE SURG 17X23 STRL (DRAPES) ×1 IMPLANT
DRSG OPSITE POSTOP 4X6 (GAUZE/BANDAGES/DRESSINGS) IMPLANT
DURAPREP 26ML APPLICATOR (WOUND CARE) ×1 IMPLANT
ELECT REM PT RETURN 9FT ADLT (ELECTROSURGICAL) ×1
ELECTRODE REM PT RTRN 9FT ADLT (ELECTROSURGICAL) ×1 IMPLANT
GAUZE 4X4 16PLY ~~LOC~~+RFID DBL (SPONGE) IMPLANT
GLOVE BIO SURGEON STRL SZ7 (GLOVE) IMPLANT
GLOVE BIO SURGEON STRL SZ8 (GLOVE) ×1 IMPLANT
GLOVE BIOGEL PI IND STRL 7.0 (GLOVE) IMPLANT
GOWN STRL REUS W/ TWL LRG LVL3 (GOWN DISPOSABLE) IMPLANT
GOWN STRL REUS W/ TWL XL LVL3 (GOWN DISPOSABLE) ×1 IMPLANT
GOWN STRL REUS W/TWL 2XL LVL3 (GOWN DISPOSABLE) IMPLANT
HEMOSTAT POWDER KIT SURGIFOAM (HEMOSTASIS) ×1 IMPLANT
KIT BASIN OR (CUSTOM PROCEDURE TRAY) ×1 IMPLANT
KIT TURNOVER KIT B (KITS) ×1 IMPLANT
NDL HYPO 25X1 1.5 SAFETY (NEEDLE) ×1 IMPLANT
NDL SPNL 20GX3.5 QUINCKE YW (NEEDLE) IMPLANT
NEEDLE HYPO 25X1 1.5 SAFETY (NEEDLE) ×1 IMPLANT
NEEDLE SPNL 20GX3.5 QUINCKE YW (NEEDLE) IMPLANT
NS IRRIG 1000ML POUR BTL (IV SOLUTION) ×1 IMPLANT
PACK LAMINECTOMY NEURO (CUSTOM PROCEDURE TRAY) ×1 IMPLANT
PAD ARMBOARD 7.5X6 YLW CONV (MISCELLANEOUS) ×3 IMPLANT
SPONGE SURGIFOAM ABS GEL SZ50 (HEMOSTASIS) ×1 IMPLANT
STRIP CLOSURE SKIN 1/2X4 (GAUZE/BANDAGES/DRESSINGS) ×1 IMPLANT
SUT VIC AB 0 CT1 18XCR BRD8 (SUTURE) ×1 IMPLANT
SUT VIC AB 2-0 CP2 18 (SUTURE) ×1 IMPLANT
SUT VIC AB 3-0 SH 8-18 (SUTURE) ×1 IMPLANT
TOWEL GREEN STERILE (TOWEL DISPOSABLE) ×1 IMPLANT
TOWEL GREEN STERILE FF (TOWEL DISPOSABLE) ×1 IMPLANT
WATER STERILE IRR 1000ML POUR (IV SOLUTION) ×1 IMPLANT

## 2023-01-13 NOTE — Transfer of Care (Signed)
Immediate Anesthesia Transfer of Care Note  Patient: Lauren Ortega  Procedure(s) Performed: Left - Lumbar Three-Lumbar Four - Lumbar Four-Lumbar Five with sublaminar decompression (Left: Back)  Patient Location: PACU  Anesthesia Type:General  Level of Consciousness: drowsy  Airway & Oxygen Therapy: Patient Spontanous Breathing and Patient connected to face mask oxygen  Post-op Assessment: Report given to RN and Post -op Vital signs reviewed and stable  Post vital signs: Reviewed and stable  Last Vitals:  Vitals Value Taken Time  BP 99/70 01/13/23 0950  Temp    Pulse 69 01/13/23 0953  Resp 18 01/13/23 0953  SpO2 99 % 01/13/23 0953  Vitals shown include unfiled device data.  Last Pain:  Vitals:   01/13/23 0620  TempSrc:   PainSc: 3          Complications: No notable events documented.

## 2023-01-13 NOTE — Anesthesia Procedure Notes (Signed)
Procedure Name: Intubation Date/Time: 01/13/2023 8:05 AM  Performed by: April Holding, CRNAPre-anesthesia Checklist: Patient identified, Emergency Drugs available, Suction available and Patient being monitored Patient Re-evaluated:Patient Re-evaluated prior to induction Oxygen Delivery Method: Circle System Utilized Preoxygenation: Pre-oxygenation with 100% oxygen Induction Type: IV induction Ventilation: Mask ventilation without difficulty Laryngoscope Size: Glidescope and 3 Grade View: Grade II Tube type: Oral Tube size: 7.0 mm Number of attempts: 1 Airway Equipment and Method: Stylet and Oral airway Placement Confirmation: ETT inserted through vocal cords under direct vision, positive ETCO2 and breath sounds checked- equal and bilateral Secured at: 22 cm Tube secured with: Tape Dental Injury: Teeth and Oropharynx as per pre-operative assessment

## 2023-01-13 NOTE — Discharge Summary (Signed)
Physician Discharge Summary  Patient ID: Lauren Ortega MRN: [id number] DOB/AGE: 02-Apr-1980 42 y.o.  Admit date: 01/13/2023 Discharge date: 01/13/2023  Admission Diagnoses: lumbar stenosis with radiculopathy    Discharge Diagnoses: same   Discharged Condition: good  Hospital Course: The patient was admitted on 01/13/2023 and taken to the operating room where the patient underwent laminectomy. The patient tolerated the procedure well and was taken to the recovery room and then to the pacu in stable condition. The hospital course was routine. There were no complications. The wound remained clean dry and intact. Pt had appropriate back soreness. No complaints of leg pain or new N/T/W. The patient remained afebrile with stable vital signs, and tolerated a regular diet. The patient continued to increase activities, and pain was well controlled with oral pain medications.   Consults: None  Significant Diagnostic Studies:  Results for orders placed or performed during the hospital encounter of 01/09/23  Protime-INR   Collection Time: 01/09/23  2:14 PM  Result Value Ref Range   Prothrombin Time 12.7 11.4 - 15.2 seconds   INR 0.9 0.8 - 1.2  Basic metabolic panel per protocol   Collection Time: 01/09/23  2:14 PM  Result Value Ref Range   Sodium 139 135 - 145 mmol/L   Potassium 3.3 (L) 3.5 - 5.1 mmol/L   Chloride 103 98 - 111 mmol/L   CO2 27 22 - 32 mmol/L   Glucose, Bld 89 70 - 99 mg/dL   BUN 10 6 - 20 mg/dL   Creatinine, Ser 6.96 0.44 - 1.00 mg/dL   Calcium 9.6 8.9 - 29.5 mg/dL   GFR, Estimated >28 >41 mL/min   Anion gap 9 5 - 15  CBC per protocol   Collection Time: 01/09/23  2:14 PM  Result Value Ref Range   WBC 8.0 4.0 - 10.5 K/uL   RBC 4.72 3.87 - 5.11 MIL/uL   Hemoglobin 14.4 12.0 - 15.0 g/dL   HCT 32.4 40.1 - 02.7 %   MCV 91.5 80.0 - 100.0 fL   MCH 30.5 26.0 - 34.0 pg   MCHC 33.3 30.0 - 36.0 g/dL   RDW 25.3 66.4 - 40.3 %   Platelets 412 (H) 150 - 400 K/uL   nRBC 0.0 0.0  - 0.2 %  Surgical pcr screen   Collection Time: 01/09/23  2:16 PM   Specimen: Nasal Mucosa; Nasal Swab  Result Value Ref Range   MRSA, PCR NEGATIVE NEGATIVE   Staphylococcus aureus NEGATIVE NEGATIVE    No results found.  Antibiotics:  Anti-infectives (From admission, onward)    Start     Dose/Rate Route Frequency Ordered Stop   01/13/23 0600  ceFAZolin (ANCEF) IVPB 2g/100 mL premix        2 g 200 mL/hr over 30 Minutes Intravenous On call to O.R. 01/13/23 0550 01/13/23 0800       Discharge Exam: Blood pressure 102/69, pulse 74, temperature (!) 97.5 F (36.4 C), resp. rate (!) 21, height 5' (1.524 m), weight 65.8 kg, last menstrual period 05/17/2020, SpO2 100%. Neurologic: Grossly normal Dressing dry  Discharge Medications:   Allergies as of 01/13/2023       Reactions   Iodine Itching, Rash   Unsure of this- was a "red prep"        Medication List     STOP taking these medications    [id number] bupropion xl/placebo 150 MG capsule       TAKE these medications    acetaminophen 500 MG tablet Commonly known  as: TYLENOL Take 2 tablets (1,000 mg total) by mouth 4 (four) times daily.   amLODipine 10 MG tablet Commonly known as: NORVASC Take 10 mg by mouth daily.   anastrozole 1 MG tablet Commonly known as: ARIMIDEX TAKE 1 TABLET BY MOUTH EVERY DAY   cyanocobalamin 1000 MCG tablet Commonly known as: VITAMIN B12 TAKE 1 TABLET BY MOUTH EVERY DAY   DULoxetine 30 MG capsule Commonly known as: CYMBALTA TAKE 1 CAPSULE BY MOUTH 2 TIMES DAILY.   estradiol 0.1 MG/GM vaginal cream Commonly known as: ESTRACE PLACE 1 APPLICATORFUL VAGINALLY 2 (TWO) TIMES DAILY. TWICE DAILY FOR TWO WEEKS, THEN TWICE WEEKLY.   ferrous sulfate 325 (65 FE) MG EC tablet Take 325 mg by mouth daily.   folic acid 1 MG tablet Commonly known as: FOLVITE Take 1 mg by mouth daily.   ibuprofen 200 MG tablet Commonly known as: ADVIL Take 800 mg by mouth every 6 (six) hours as needed  for moderate pain (pain score 4-6).   ibuprofen 600 MG tablet Commonly known as: ADVIL Take 1 tablet (600 mg total) by mouth 4 (four) times daily.   leuprolide 11.25 MG injection Commonly known as: LUPRON Inject 11.25 mg into the muscle every 3 (three) months.   magnesium oxide 400 (240 Mg) MG tablet Commonly known as: MAG-OX TAKE 1 TABLET BY MOUTH TWICE A DAY IN THE MORNING AND IN THE EVENING   VITAMIN D3 PO Take 1 tablet by mouth daily.        Disposition: home   Final Dx: lumbar laminectomy L3-4 L4-5  Discharge Instructions      Remove dressing in 72 hours   Complete by: As directed    Call MD for:  difficulty breathing, headache or visual disturbances   Complete by: As directed    Call MD for:  persistant nausea and vomiting   Complete by: As directed    Call MD for:  redness, tenderness, or signs of infection (pain, swelling, redness, odor or green/yellow discharge around incision site)   Complete by: As directed    Call MD for:  severe uncontrolled pain   Complete by: As directed    Call MD for:  temperature >100.4   Complete by: As directed    Diet - low sodium heart healthy   Complete by: As directed    Increase activity slowly   Complete by: As directed           Signed: Tia Alert 01/13/2023, 10:13 AM

## 2023-01-13 NOTE — H&P (Signed)
Subjective: Patient is a 42 y.o. female admitted for L leg pain. Onset of symptoms was several months ago, gradually worsening since that time.  The pain is rated severe, and is located at the across the lower back and radiates to LLE. The pain is described as aching and occurs all day. The symptoms have been progressive. Symptoms are exacerbated by exercise, standing, and walking for more than a few minutes. MRI or CT showed lumbar stenosis   Past Medical History:  Diagnosis Date   Anemia    Anxiety    Breast cancer (HCC) 03/2020   IDC grade 3 neg margins - Right   Depression    DVT (deep venous thrombosis) (HCC) 07/06/2020   left axillary/subclavian DVT in setting of left [facility] Port-a-cath, discontinued 06/19/21   Family history of colon cancer    Family history of lymphoma    Family history of pancreatic cancer    Family history of skin cancer    Heart murmur    Hypertension    Left bundle branch block (LBBB) 05/19/2020   Lumbar degenerative disc disease    Neuropathy due to chemotherapeutic drug [facility])     Past Surgical History:  Procedure Laterality Date   BREAST LUMPECTOMY WITH RADIOACTIVE SEED AND SENTINEL LYMPH NODE BIOPSY Right 05/20/2020   Procedure: RIGHT BREAST LUMPECTOMY WITH RADIOACTIVE SEED AND SENTINEL LYMPH NODE BIOPSY;  Surgeon: Abigail Miyamoto, MD;  Location: [facility];  Service: General;  Laterality: Right;   CESAREAN SECTION     x3   PORT-A-CATH REMOVAL Left 06/19/2021   Procedure: REMOVAL PORT-A-CATH;  Surgeon: Diamantina Monks, MD;  Location: [facility] OR;  Service: General;  Laterality: Left;   PORTACATH PLACEMENT Left 05/20/2020   Procedure: INSERTION PORT-A-CATH;  Surgeon: Abigail Miyamoto, MD;  Location: [facility];  Service: General;  Laterality: Left;   TONSILLECTOMY     TUBAL LIGATION      Prior to Admission medications   Medication Sig Start Date End Date Taking? Authorizing Provider  amLODipine (NORVASC) 10 MG tablet Take 10 mg  by mouth daily. 04/03/20  Yes [provider]  anastrozole (ARIMIDEX) 1 MG tablet TAKE 1 TABLET BY MOUTH EVERY DAY 10/04/22  Yes Doreatha Massed, MD  Cholecalciferol (VITAMIN D3 PO) Take 1 tablet by mouth daily.   Yes [provider]  cyanocobalamin (VITAMIN B12) 1000 MCG tablet TAKE 1 TABLET BY MOUTH EVERY DAY 10/27/22  Yes Doreatha Massed, MD  estradiol (ESTRACE) 0.1 MG/GM vaginal cream PLACE 1 APPLICATORFUL VAGINALLY 2 (TWO) TIMES DAILY. TWICE DAILY FOR TWO WEEKS, THEN TWICE WEEKLY. 01/05/23  Yes Doreatha Massed, MD  ferrous sulfate 325 (65 FE) MG EC tablet Take 325 mg by mouth daily.   Yes [provider]  folic acid (FOLVITE) 1 MG tablet Take 1 mg by mouth daily. 06/26/20  Yes [provider]  ibuprofen (ADVIL) 200 MG tablet Take 800 mg by mouth every 6 (six) hours as needed for moderate pain (pain score 4-6).   Yes [provider]  leuprolide (LUPRON) 11.25 MG injection Inject 11.25 mg into the muscle every 3 (three) months.   Yes [provider]  magnesium oxide (MAG-OX) 400 (240 Mg) MG tablet TAKE 1 TABLET BY MOUTH TWICE A DAY IN THE MORNING AND IN THE EVENING 04/22/22  Yes Doreatha Massed, MD  acetaminophen (TYLENOL) 500 MG tablet Take 2 tablets (1,000 mg total) by mouth 4 (four) times daily. Patient not taking: Reported on 01/05/2023 06/19/21   Kris Mouton  N, MD  DULoxetine (CYMBALTA) 30 MG capsule TAKE 1 CAPSULE BY MOUTH 2 TIMES DAILY. Patient not taking: Reported on 01/05/2023 05/30/22   Doreatha Massed, MD  ibuprofen (ADVIL) 600 MG tablet Take 1 tablet (600 mg total) by mouth 4 (four) times daily. Patient not taking: Reported on 01/05/2023 06/19/21   Diamantina Monks, MD  [id number] bupropion xl/placebo 150 MG capsule Take 1 capsule (150 mg total) by mouth every morning for week 1, then take 2 capsules (300 mg total) every morning from week 2-12, then take 1 capsule (150mg  total) by mouth every morning for the  week 13 (last week of treatment). Please bring your bottle to Research visits. Patient not taking: Reported on 01/05/2023 10/03/22   Doreatha Massed, MD   Allergies  Allergen Reactions   Iodine Itching and Rash    Social History   Tobacco Use   Smoking status: Never   Smokeless tobacco: Never  Substance Use Topics   Alcohol use: Yes    Comment: occassionally    Family History  Problem Relation Age of Onset   Basal cell carcinoma Mother 76   Bladder Cancer Maternal Uncle 72   Lymphoma Maternal Uncle 37       Waldenstroms    Pancreatic cancer Maternal Grandmother    Non-Hodgkin's lymphoma Maternal Grandfather    Lung cancer Paternal Grandfather    Colon cancer Other        x8 or 9   Colon cancer Maternal Great-grandfather      Review of Systems  Positive ROS: neg  All other systems have been reviewed and were otherwise negative with the exception of those mentioned in the HPI and as above.  Objective: Vital signs in last 24 hours: Temp:  [98.2 F (36.8 C)] 98.2 F (36.8 C) (12/20 0552) Pulse Rate:  [84] 84 (12/20 0552) Resp:  [18] 18 (12/20 0552) BP: (123)/(84) 123/84 (12/20 0552) SpO2:  [97 %] 97 % (12/20 0552) Weight:  [65.8 kg] 65.8 kg (12/20 0552)  General Appearance: Alert, cooperative, no distress, appears stated age Head: Normocephalic, without obvious abnormality, atraumatic Eyes: PERRL, conjunctiva/corneas clear, EOM's intact    Neck: Supple, symmetrical, trachea midline Back: Symmetric, no curvature, ROM normal, no CVA tenderness Lungs:  respirations unlabored Heart: Regular rate and rhythm Abdomen: Soft, non-tender Extremities: Extremities normal, atraumatic, no cyanosis or edema Pulses: 2+ and symmetric all extremities Skin: Skin color, texture, turgor normal, no rashes or lesions  NEUROLOGIC:   Mental status: Alert and oriented x4,  no aphasia, good attention span, fund of knowledge, and memory Motor Exam - grossly normal Sensory Exam -  grossly normal Reflexes: 1= Coordination - grossly normal Gait - grossly normal Balance - grossly normal Cranial Nerves: I: smell Not tested  II: visual acuity  OS: nl    OD: nl  II: visual fields Full to confrontation  II: pupils Equal, round, reactive to light  III,VII: ptosis None  III,IV,VI: extraocular muscles  Full ROM  V: mastication Normal  V: facial light touch sensation  Normal  V,VII: corneal reflex  Present  VII: facial muscle function - upper  Normal  VII: facial muscle function - lower Normal  VIII: hearing Not tested  IX: soft palate elevation  Normal  IX,X: gag reflex Present  XI: trapezius strength  5/5  XI: sternocleidomastoid strength 5/5  XI: neck flexion strength  5/5  XII: tongue strength  Normal    Data Review Lab Results  Component Value Date   WBC 8.0  01/09/2023   HGB 14.4 01/09/2023   HCT 43.2 01/09/2023   MCV 91.5 01/09/2023   PLT 412 (H) 01/09/2023   Lab Results  Component Value Date   NA 139 01/09/2023   K 3.3 (L) 01/09/2023   CL 103 01/09/2023   CO2 27 01/09/2023   BUN 10 01/09/2023   CREATININE 0.81 01/09/2023   GLUCOSE 89 01/09/2023   Lab Results  Component Value Date   INR 0.9 01/09/2023    Assessment/Plan:  Estimated body mass index is 28.32 kg/m as calculated from the following:   Height as of this encounter: 5' (1.524 m).   Weight as of this encounter: 65.8 kg. Patient admitted for L L3-4 l4-5 laminectomy with sublaminar decompression. Patient has failed a reasonable attempt at conservative therapy.  I explained the condition and procedure to the patient and answered any questions.  Patient wishes to proceed with procedure as planned. Understands risks/ benefits and typical outcomes of procedure.   Tia Alert 01/13/2023 7:36 AM

## 2023-01-13 NOTE — Op Note (Signed)
01/13/2023  9:43 AM  PATIENT:  Lauren Ortega  42 y.o. female  PRE-OPERATIVE DIAGNOSIS: Lumbar spinal stenosis L3-4 and L4-5 with left lower extremity radiculopathy  POST-OPERATIVE DIAGNOSIS:  same  PROCEDURE: Left L3-4 L4-5 hemilaminectomy, medial facetectomy, and foraminotomies followed by sublaminar decompression to decompress the central canal and right side  SURGEON:  Marikay Alar, MD  ASSISTANTS: Dr. Danielle Dess  ANESTHESIA:   General  EBL: 30 ml  Total I/O In: 750 [I.V.:750] Out: 30 [Blood:30]  BLOOD ADMINISTERED: none  DRAINS: None  SPECIMEN:  none  Findings at surgery: On the left at L4-5 she either had conjoined nerve roots or a high takeoff of the L5 nerve root.  I was careful to dissect fibrous tissue from the surface of the L5 nerve and try to sweep it medially to evaluate the disc for disc herniation.  I did not find significant disc herniation and felt that annulotomy and discectomy would risk neural injury  INDICATION FOR PROCEDURE: This patient presented with back pain and left leg pain. Imaging showed spinal stenosis at L3-4 with severe spinal stenosis at L4-5. The patient tried conservative measures without relief. Pain was debilitating. Recommended left L3-4 and L4-5 hemilaminectomy for decompression followed by sublaminar decompression. Patient understood the risks, benefits, and alternatives and potential outcomes and wished to proceed.  PROCEDURE DETAILS: The patient was taken to the operating room and after induction of adequate generalized endotracheal anesthesia, the patient was rolled into the prone position on the Wilson frame and all pressure points were padded. The lumbar region was cleaned and then prepped with DuraPrep and draped in the usual sterile fashion. 5 cc of local anesthesia was injected and then a dorsal midline incision was made and carried down to the lumbo sacral fascia. The fascia was opened and the paraspinous musculature was taken down in a  subperiosteal fashion to expose L3-4 and L4-5 on the left. Intraoperative x-ray confirmed my level, and then I used a combination of the high-speed drill and the Kerrison punches to perform a hemilaminectomy, medial facetectomy, and foraminotomy at L3-4 and L4-5 on the left. The underlying yellow ligament was opened and removed in a piecemeal fashion to expose the underlying dura and exiting nerve root. I undercut the lateral recess and dissected down until I was medial to and distal to the pedicle. The nerve root was well decompressed.  At L4-5 on the left identified both the L4 and the L5 nerve roots.  There was very little space between the nerve roots and there appeared to be a very high takeoff of the L5 nerve root.  They were either conjoined or there was just a high takeoff.  This made retracting it medially to identify the disc base much more difficult.  We were gentle in our retraction of the nerve root and were able to identify the annulus.  It was firm and did not appear to be bulging or herniated.  We did not feel discectomy was necessary.  At both levels I drilled up under the spinous process and the opposite lamina and performed a sublaminar decompression to decompress the central canal in the right lateral recess.  I was able to identify the right lateral edge of the dura and the exiting nerve roots.. I then palpated with a coronary dilator along the nerve root and into the foramen to assure adequate decompression. I felt no more compression of the nerve roots. I irrigated with saline solution. Achieved hemostasis with bipolar cautery, lined the dura with Gelfoam,  and then closed the fascia with 0 Vicryl. I closed the subcutaneous tissues with 2-0 Vicryl and the subcuticular tissues with 3-0 Vicryl. The skin was then closed with benzoin and Steri-Strips. The drapes were removed, a sterile dressing was applied.  Dr. Danielle Dess was involved in the exposure, safe retraction of the neural elements when  necessary,  and the closure. the patient was awakened from general anesthesia and transferred to the recovery room in stable condition. At the end of the procedure all sponge, needle and instrument counts were correct.    PLAN OF CARE: Admit for overnight observation  PATIENT DISPOSITION:  PACU - hemodynamically stable.   Delay start of Pharmacological VTE agent (>24hrs) due to surgical blood loss or risk of bleeding:  yes

## 2023-01-15 NOTE — Anesthesia Postprocedure Evaluation (Signed)
Anesthesia Post Note  Patient: Izariah Ritzenthaler  Procedure(s) Performed: Left - Lumbar Three-Lumbar Four - Lumbar Four-Lumbar Five with sublaminar decompression (Left: Back)     Patient location during evaluation: PACU Anesthesia Type: General Level of consciousness: awake and alert Pain management: pain level controlled Vital Signs Assessment: post-procedure vital signs reviewed and stable Respiratory status: spontaneous breathing, nonlabored ventilation and respiratory function stable Cardiovascular status: blood pressure returned to baseline and stable Postop Assessment: no apparent nausea or vomiting Anesthetic complications: no   No notable events documented.  Last Vitals:  Vitals:   01/13/23 1015 01/13/23 1030  BP: 108/77 100/73  Pulse: 70 67  Resp: 13 14  Temp:  (!) 36.2 C  SpO2: 95% 97%    Last Pain:  Vitals:   01/13/23 1015  TempSrc:   PainSc: 1                  Kristal Perl

## 2023-01-16 ENCOUNTER — Encounter (HOSPITAL_COMMUNITY): Payer: Self-pay | Admitting: Neurological Surgery

## 2023-02-09 ENCOUNTER — Other Ambulatory Visit: Payer: Self-pay | Admitting: *Deleted

## 2023-02-09 DIAGNOSIS — Z170 Estrogen receptor positive status [ER+]: Secondary | ICD-10-CM

## 2023-02-13 ENCOUNTER — Encounter: Payer: Self-pay | Admitting: Hematology

## 2023-02-24 ENCOUNTER — Inpatient Hospital Stay: Payer: PRIVATE HEALTH INSURANCE | Attending: Hematology

## 2023-02-24 ENCOUNTER — Inpatient Hospital Stay: Payer: PRIVATE HEALTH INSURANCE

## 2023-02-24 VITALS — BP 114/81 | HR 87 | Temp 98.1°F | Resp 18

## 2023-02-24 DIAGNOSIS — Z5111 Encounter for antineoplastic chemotherapy: Secondary | ICD-10-CM | POA: Insufficient documentation

## 2023-02-24 DIAGNOSIS — C50411 Malignant neoplasm of upper-outer quadrant of right female breast: Secondary | ICD-10-CM | POA: Insufficient documentation

## 2023-02-24 DIAGNOSIS — Z1731 Human epidermal growth factor receptor 2 positive status: Secondary | ICD-10-CM | POA: Diagnosis not present

## 2023-02-24 DIAGNOSIS — D509 Iron deficiency anemia, unspecified: Secondary | ICD-10-CM

## 2023-02-24 DIAGNOSIS — Z170 Estrogen receptor positive status [ER+]: Secondary | ICD-10-CM

## 2023-02-24 MED ORDER — LEUPROLIDE ACETATE (3 MONTH) 11.25 MG IM KIT
11.2500 mg | PACK | Freq: Once | INTRAMUSCULAR | Status: AC
  Administered 2023-02-24: 11.25 mg via INTRAMUSCULAR
  Filled 2023-02-24: qty 11.25

## 2023-02-24 NOTE — Progress Notes (Signed)
Lauren Ortega presents today for Lupron injection per the provider's orders.  Stable during administration without incident; injection site WNL; see MAR for injection details.  Patient tolerated procedure well and without incident.  No questions or complaints noted at this time.

## 2023-02-24 NOTE — Patient Instructions (Signed)
[facility] [place] - A DEPT OF [facility]  Discharge Instructions: Thank you for choosing [facility] to provide your oncology and hematology care.  If you have a lab appointment with the Cancer Center - please note that after April 8th, 2024, all labs will be drawn in the cancer center.  You do not have to check in or register with the main entrance as you have in the past but will complete your check-in in the cancer center.  Wear comfortable clothing and clothing appropriate for easy access to any Portacath or PICC line.   We strive to give you quality time with your provider. You may need to reschedule your appointment if you arrive late (15 or more minutes).  Arriving late affects you and other patients whose appointments are after yours.  Also, if you miss three or more appointments without notifying the office, you may be dismissed from the clinic at the provider's discretion.      For prescription refill requests, have your pharmacy contact our office and allow 72 hours for refills to be completed.    Today you received the following chemotherapy and/or immunotherapy agents Lupron      To help prevent nausea and vomiting after your treatment, we encourage you to take your nausea medication as directed.  BELOW ARE SYMPTOMS THAT SHOULD BE REPORTED IMMEDIATELY: *FEVER GREATER THAN 100.4 F (38 C) OR HIGHER *CHILLS OR SWEATING *NAUSEA AND VOMITING THAT IS NOT CONTROLLED WITH YOUR NAUSEA MEDICATION *UNUSUAL SHORTNESS OF BREATH *UNUSUAL BRUISING OR BLEEDING *URINARY PROBLEMS (pain or burning when urinating, or frequent urination) *BOWEL PROBLEMS (unusual diarrhea, constipation, pain near the anus) TENDERNESS IN MOUTH AND THROAT WITH OR WITHOUT PRESENCE OF ULCERS (sore throat, sores in mouth, or a toothache) UNUSUAL RASH, SWELLING OR PAIN  UNUSUAL VAGINAL DISCHARGE OR ITCHING   Items with * indicate a potential emergency and should be followed up as  soon as possible or go to the Emergency Department if any problems should occur.  Please show the CHEMOTHERAPY ALERT CARD or IMMUNOTHERAPY ALERT CARD at check-in to the Emergency Department and triage nurse.  Should you have questions after your visit or need to cancel or reschedule your appointment, please contact [facility] [place] - A DEPT OF [facility] [phone]  and follow the prompts.  Office hours are 8:00 a.m. to 4:30 p.m. Monday - Friday. Please note that voicemails left after 4:00 p.m. may not be returned until the following business day.  We are closed weekends and major holidays. You have access to a nurse at all times for urgent questions. Please call the main number to the clinic [phone] and follow the prompts.  For any non-urgent questions, you may also contact your provider using MyChart. We now offer e-Visits for anyone 72 and older to request care online for non-urgent symptoms. For details visit [email or web address].   Also download the MyChart app! Go to the app store, search "MyChart", open the app, select , and log in with your MyChart username and password.

## 2023-02-27 ENCOUNTER — Other Ambulatory Visit (HOSPITAL_COMMUNITY): Payer: Self-pay | Admitting: Hematology

## 2023-02-27 DIAGNOSIS — Z1231 Encounter for screening mammogram for malignant neoplasm of breast: Secondary | ICD-10-CM

## 2023-02-27 NOTE — Telephone Encounter (Signed)
 Please advise

## 2023-03-02 ENCOUNTER — Telehealth: Payer: Self-pay

## 2023-03-02 NOTE — Telephone Encounter (Signed)
 Research nurse called patient to complete questionnaires for Symptom Interference, Covid Effects and Promis via telephone today. Patient verified her name and date of birth prior to answering the questionnaires.  The patient returned all other questionnaires via fax from [facility] earlier this week. These questionnaires were not returned with them. The patient had not difficulty completing the questionnaires over the telephone today.  Reena Romans, RN 03/02/23 4:05 PM .

## 2023-03-27 IMAGING — MG MM PLC BREAST LOC DEV 1ST LESION INC*R*
8 of 23 series · 8 of 40 positions shown · non-contrast
Comparison: Previous exam(s).

CLINICAL DATA: 40-year-old female presenting for radioactive seed
localization prior to right breast lumpectomy.

EXAM:
MAMMOGRAPHIC GUIDED RADIOACTIVE SEED LOCALIZATION OF THE RIGHT
BREAST

[R CC (1 of 4)]
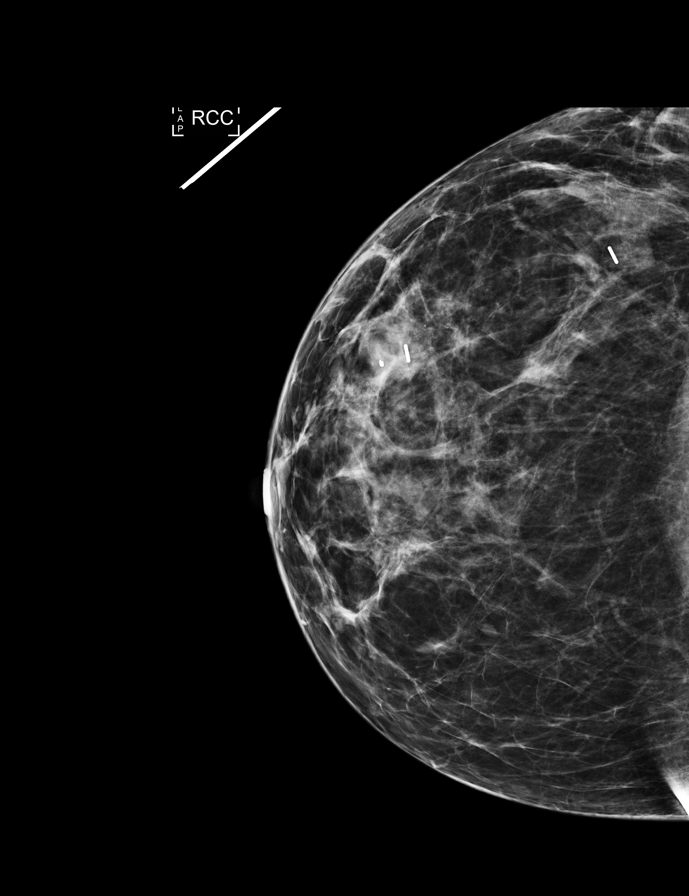

[R LM (1 of 2)]
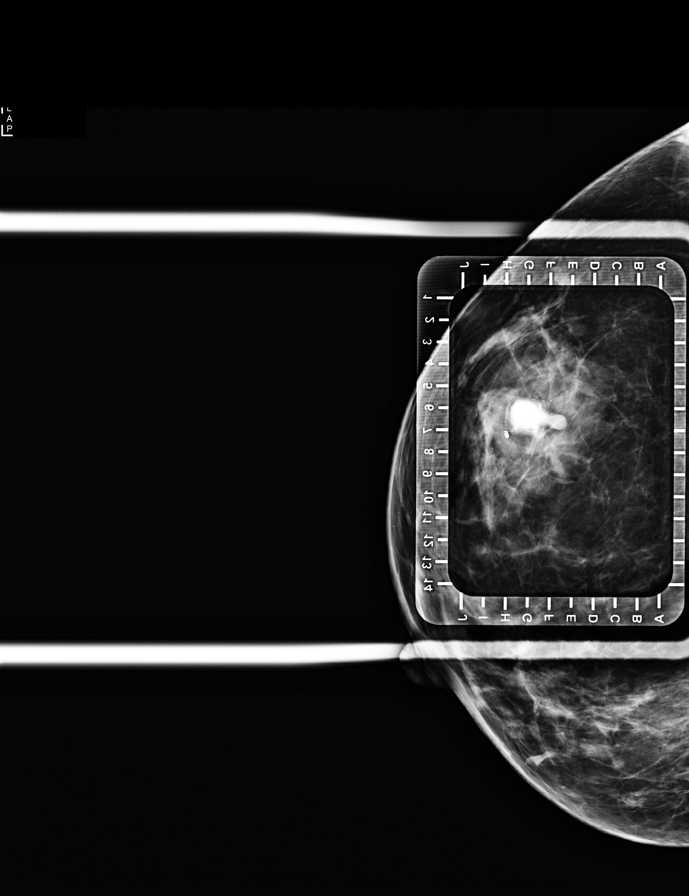

[R LM (2 of 2)]
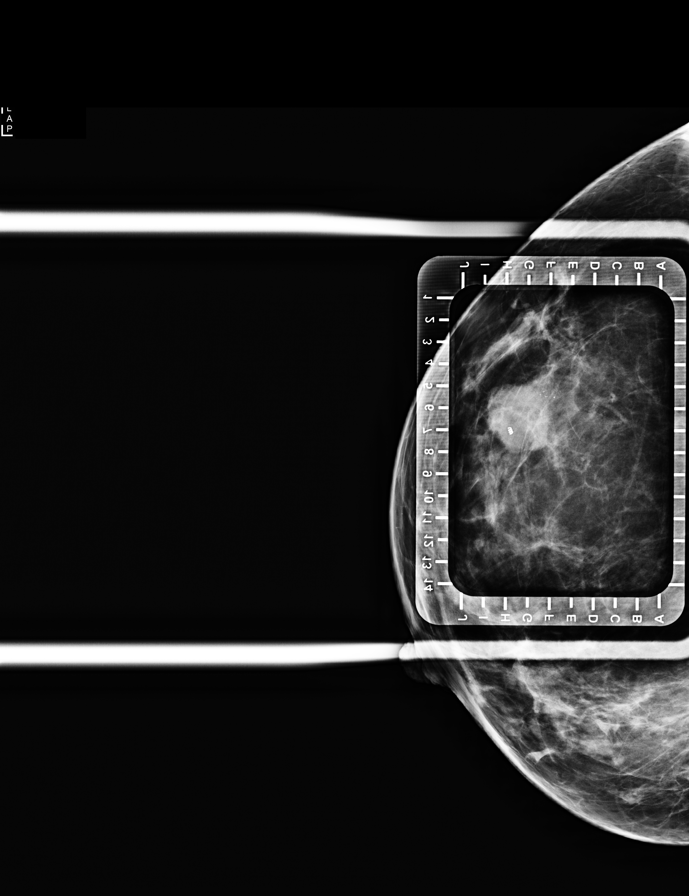

[R ML (1 of 2)]
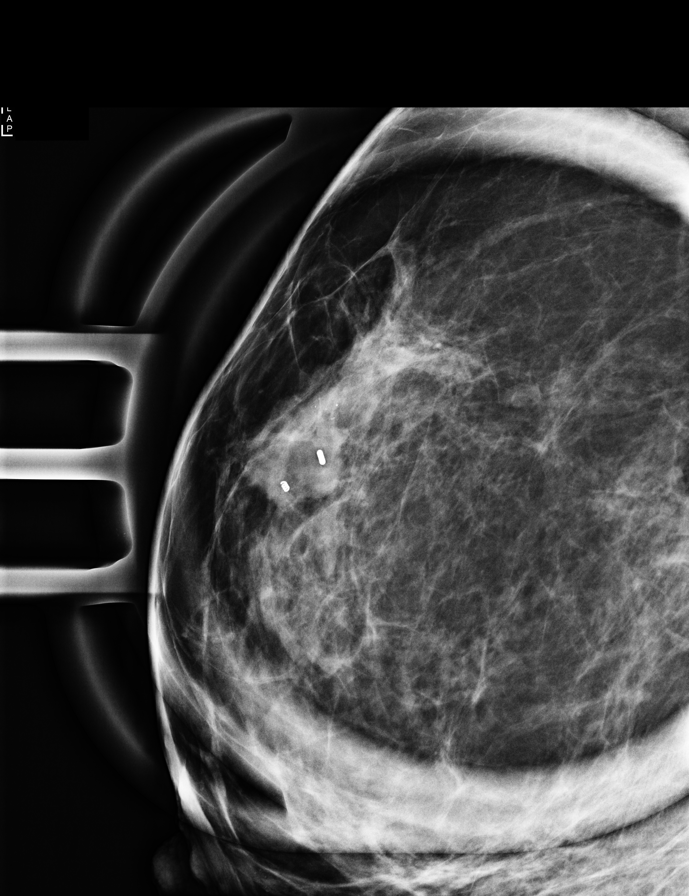

[R CC (2 of 4)]
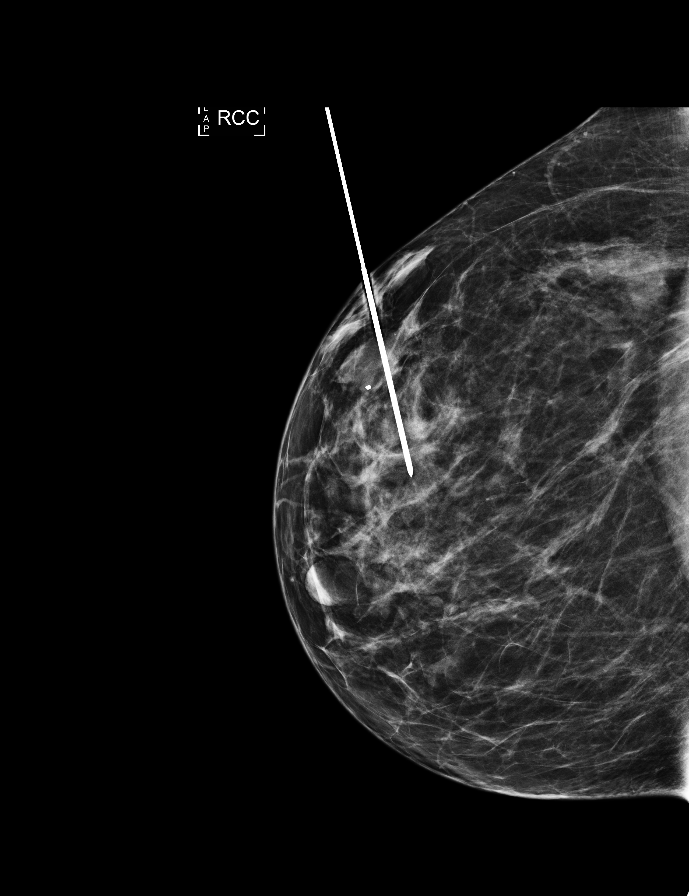

[R ML (2 of 2)]
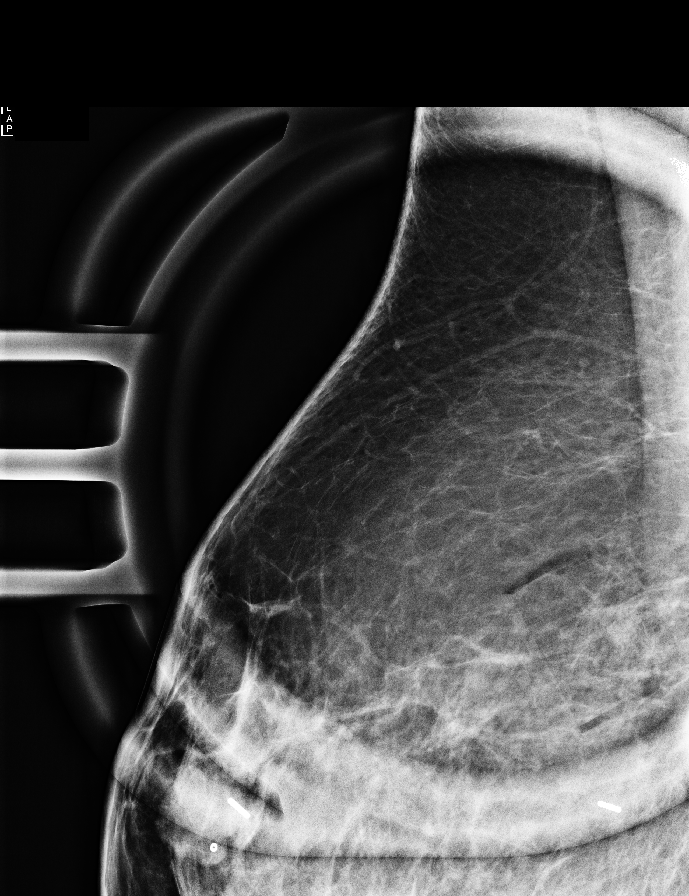

[R CC (3 of 4)]
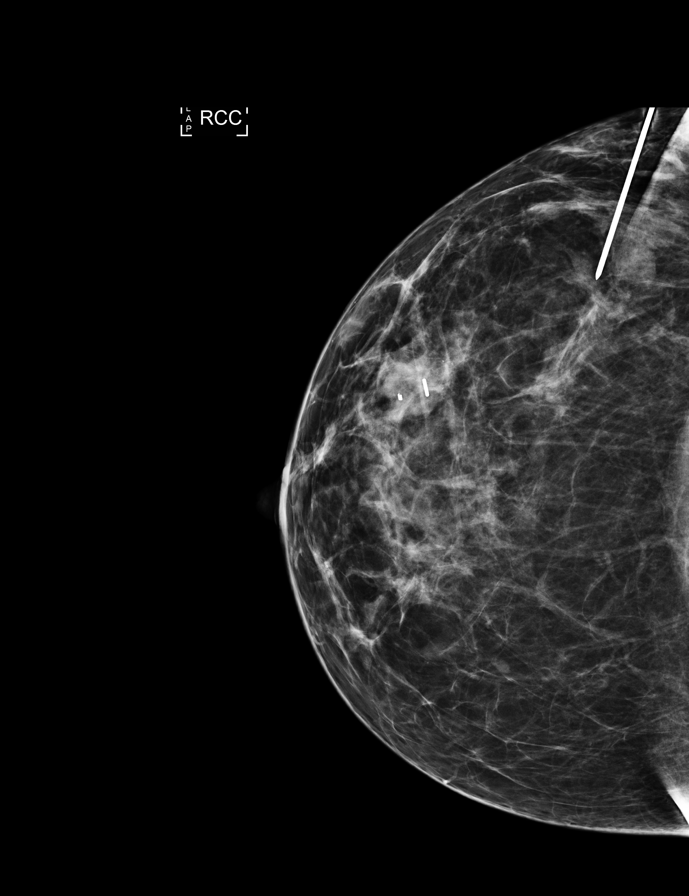

[R CC (4 of 4)]
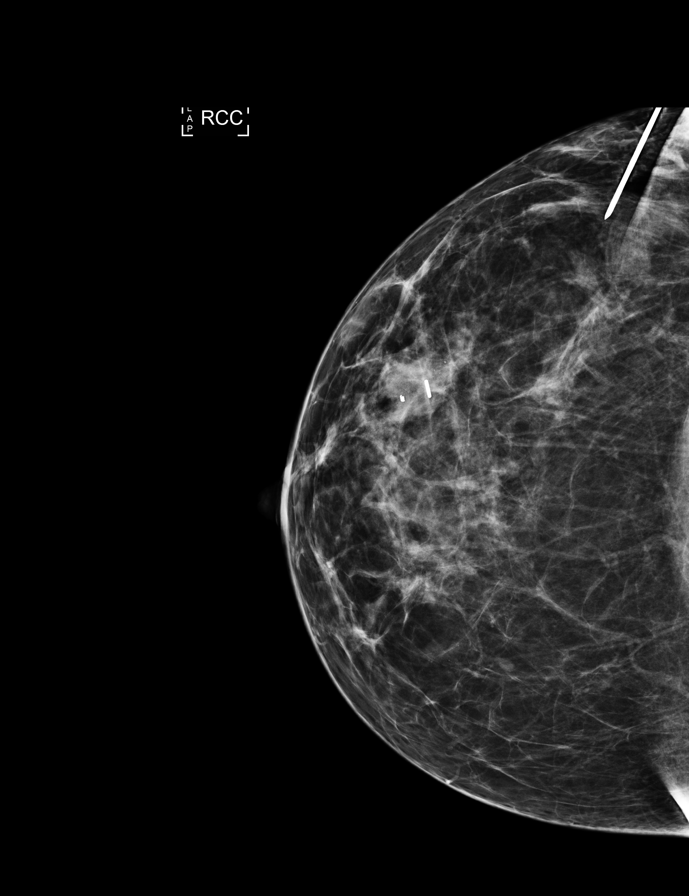

[8 of 40 positions shown; findings below may reference images not displayed]

FINDINGS: Prior to the procedure, right breast CC, ML and MLO images were
performed. The BI-RADS 3 asymmetry in the upper-outer posterior
right breast is again seen on these images. The 2 year follow-up for
this asymmetry has not been completed, therefore I contacted Dr.
Walter regarding management. I also discussed the options with the
patient which were to place a radioactive seed in the asymmetry for
excisional biopsy during her surgery tomorrow, or cancel surgery and
perform a stereotactic biopsy today. The patient preferred to place
a second radioactive seed and proceed with surgery as planned. The
final plan was discussed with Dr. Walter prior to her seed
placements today.

Patient presents for radioactive seed localization prior to right
breast lumpectomy. I met with the patient and we discussed the
procedure of seed localization including benefits and alternatives.
We discussed the high likelihood of a successful procedure. We
discussed the risks of the procedure including infection, bleeding,
tissue injury and further surgery. We discussed the low dose of
radioactivity involved in the procedure. Informed, written consent
was given.

The usual time-out protocol was performed immediately prior to the
procedure.

Using mammographic guidance, sterile technique, 1% lidocaine and an
L-64T radioactive seed, the Tennis shaped biopsy marking clip in the
upper outer anterior right breast was localized using a lateral
approach. The follow-up mammogram images confirm the seed in the
expected location and were marked for Dr. Walter.

Follow-up survey of the patient confirms presence of the radioactive
seed.

Order number of L-64T seed:  [id number].

Total activity:  0.241 millicuries reference Date: 05/13/2020

Using mammographic guidance, sterile technique, 1% lidocaine and an
L-64T radioactive seed, the asymmetry in the lateral posterior right
breast was localized using a lateral approach. The follow-up
mammogram images confirm the seed in the expected location and were
marked for Dr. Walter.

Follow-up survey of the patient confirms presence of the radioactive
seed.

Order number of L-[id number] seed:  [id number].

Total activity:  0.258 millicuries reference Date: 04/27/2020

On the initial lateral view after seed placement, I was concerned of
a small area of calcifications in the superior central right breast,
however they did not persist on the additional magnified views.

The patient tolerated the procedure well and was released from the
[REDACTED]. She was given instructions regarding seed removal.
IMPRESSION: Radioactive seed localization right breast x 2. No apparent
complications.

## 2023-04-07 ENCOUNTER — Encounter (HOSPITAL_COMMUNITY): Payer: Self-pay

## 2023-04-07 ENCOUNTER — Ambulatory Visit (HOSPITAL_COMMUNITY)
Admission: RE | Admit: 2023-04-07 | Discharge: 2023-04-07 | Disposition: A | Discharge status: Home / Self Care | Payer: PRIVATE HEALTH INSURANCE | Source: Ambulatory Visit | Attending: Hematology | Admitting: Hematology

## 2023-04-07 DIAGNOSIS — Z170 Estrogen receptor positive status [ER+]: Secondary | ICD-10-CM | POA: Insufficient documentation

## 2023-04-07 DIAGNOSIS — Z1231 Encounter for screening mammogram for malignant neoplasm of breast: Secondary | ICD-10-CM | POA: Insufficient documentation

## 2023-04-07 DIAGNOSIS — C50411 Malignant neoplasm of upper-outer quadrant of right female breast: Secondary | ICD-10-CM | POA: Insufficient documentation

## 2023-05-25 ENCOUNTER — Ambulatory Visit (HOSPITAL_COMMUNITY)
Admission: RE | Admit: 2023-05-25 | Discharge: 2023-05-25 | Disposition: A | Discharge status: Home / Self Care | Payer: PRIVATE HEALTH INSURANCE | Source: Ambulatory Visit | Attending: Hematology | Admitting: Hematology

## 2023-05-25 ENCOUNTER — Inpatient Hospital Stay: Payer: PRIVATE HEALTH INSURANCE | Attending: Hematology

## 2023-05-25 ENCOUNTER — Inpatient Hospital Stay: Payer: PRIVATE HEALTH INSURANCE

## 2023-05-25 VITALS — BP 120/86 | HR 83 | Temp 99.0°F | Resp 18

## 2023-05-25 DIAGNOSIS — Z79899 Other long term (current) drug therapy: Secondary | ICD-10-CM | POA: Diagnosis not present

## 2023-05-25 DIAGNOSIS — Z79811 Long term (current) use of aromatase inhibitors: Secondary | ICD-10-CM | POA: Insufficient documentation

## 2023-05-25 DIAGNOSIS — Z170 Estrogen receptor positive status [ER+]: Secondary | ICD-10-CM | POA: Diagnosis not present

## 2023-05-25 DIAGNOSIS — K769 Liver disease, unspecified: Secondary | ICD-10-CM | POA: Insufficient documentation

## 2023-05-25 DIAGNOSIS — Z9221 Personal history of antineoplastic chemotherapy: Secondary | ICD-10-CM | POA: Insufficient documentation

## 2023-05-25 DIAGNOSIS — C50411 Malignant neoplasm of upper-outer quadrant of right female breast: Secondary | ICD-10-CM | POA: Diagnosis present

## 2023-05-25 DIAGNOSIS — D509 Iron deficiency anemia, unspecified: Secondary | ICD-10-CM

## 2023-05-25 LAB — CBC WITH DIFFERENTIAL/PLATELET
Abs Immature Granulocytes: 0.03 10*3/uL (ref 0.00–0.07)
Basophils Absolute: 0.1 10*3/uL (ref 0.0–0.1)
Basophils Relative: 1 %
Eosinophils Absolute: 0.1 10*3/uL (ref 0.0–0.5)
Eosinophils Relative: 2 %
HCT: 38.9 % (ref 36.0–46.0)
Hemoglobin: 13.2 g/dL (ref 12.0–15.0)
Immature Granulocytes: 0 %
Lymphocytes Relative: 18 %
Lymphs Abs: 1.4 10*3/uL (ref 0.7–4.0)
MCH: 31.1 pg (ref 26.0–34.0)
MCHC: 33.9 g/dL (ref 30.0–36.0)
MCV: 91.5 fL (ref 80.0–100.0)
Monocytes Absolute: 0.6 10*3/uL (ref 0.1–1.0)
Monocytes Relative: 8 %
Neutro Abs: 5.7 10*3/uL (ref 1.7–7.7)
Neutrophils Relative %: 71 %
Platelets: 358 10*3/uL (ref 150–400)
RBC: 4.25 MIL/uL (ref 3.87–5.11)
RDW: 13.1 % (ref 11.5–15.5)
WBC: 8 10*3/uL (ref 4.0–10.5)
nRBC: 0 % (ref 0.0–0.2)

## 2023-05-25 LAB — COMPREHENSIVE METABOLIC PANEL WITH GFR
ALT: 26 U/L (ref 0–44)
AST: 24 U/L (ref 15–41)
Albumin: 4.3 g/dL (ref 3.5–5.0)
Alkaline Phosphatase: 94 U/L (ref 38–126)
Anion gap: 12 (ref 5–15)
BUN: 15 mg/dL (ref 6–20)
CO2: 24 mmol/L (ref 22–32)
Calcium: 10.1 mg/dL (ref 8.9–10.3)
Chloride: 102 mmol/L (ref 98–111)
Creatinine, Ser: 0.63 mg/dL (ref 0.44–1.00)
GFR, Estimated: >60 mL/min (ref >60)
Glucose, Bld: 96 mg/dL (ref 70–99)
Potassium: 3.9 mmol/L (ref 3.5–5.1)
Sodium: 138 mmol/L (ref 135–145)
Total Bilirubin: 0.7 mg/dL (ref 0.0–1.2)
Total Protein: 7.3 g/dL (ref 6.5–8.1)

## 2023-05-25 LAB — VITAMIN D 25 HYDROXY (VIT D DEFICIENCY, FRACTURES): Vit D, 25-Hydroxy: 30.27 ng/mL (ref 30–100)

## 2023-05-25 MED ORDER — GADOXETATE DISODIUM 0.25 MMOL/ML IV SOLN
7.0000 mL | Freq: Once | INTRAVENOUS | Status: AC | PRN
  Administered 2023-05-25: 7 mL via INTRAVENOUS

## 2023-05-25 MED ORDER — LEUPROLIDE ACETATE (3 MONTH) 11.25 MG IM KIT
11.2500 mg | PACK | Freq: Once | INTRAMUSCULAR | Status: AC
  Administered 2023-05-25: 11.25 mg via INTRAMUSCULAR
  Filled 2023-05-25: qty 11.25

## 2023-05-25 NOTE — Patient Instructions (Signed)
 [facility] [place] - A DEPT OF [facility]  Discharge Instructions: Thank you for choosing [facility] to provide your oncology and hematology care.  If you have a lab appointment with the Cancer Center - please note that after April 8th, 2024, all labs will be drawn in the cancer center.  You do not have to check in or register with the main entrance as you have in the past but will complete your check-in in the cancer center.  Wear comfortable clothing and clothing appropriate for easy access to any Portacath or PICC line.   We strive to give you quality time with your provider. You may need to reschedule your appointment if you arrive late (15 or more minutes).  Arriving late affects you and other patients whose appointments are after yours.  Also, if you miss three or more appointments without notifying the office, you may be dismissed from the clinic at the provider's discretion.      For prescription refill requests, have your pharmacy contact our office and allow 72 hours for refills to be completed.    Today you received Lupron  injection.     BELOW ARE SYMPTOMS THAT SHOULD BE REPORTED IMMEDIATELY: *FEVER GREATER THAN 100.4 F (38 C) OR HIGHER *CHILLS OR SWEATING *NAUSEA AND VOMITING THAT IS NOT CONTROLLED WITH YOUR NAUSEA MEDICATION *UNUSUAL SHORTNESS OF BREATH *UNUSUAL BRUISING OR BLEEDING *URINARY PROBLEMS (pain or burning when urinating, or frequent urination) *BOWEL PROBLEMS (unusual diarrhea, constipation, pain near the anus) TENDERNESS IN MOUTH AND THROAT WITH OR WITHOUT PRESENCE OF ULCERS (sore throat, sores in mouth, or a toothache) UNUSUAL RASH, SWELLING OR PAIN  UNUSUAL VAGINAL DISCHARGE OR ITCHING   Items with * indicate a potential emergency and should be followed up as soon as possible or go to the Emergency Department if any problems should occur.  Please show the CHEMOTHERAPY ALERT CARD or IMMUNOTHERAPY ALERT CARD at check-in  to the Emergency Department and triage nurse.  Should you have questions after your visit or need to cancel or reschedule your appointment, please contact [facility] [employer] - A DEPT OF [facility] [phone]  and follow the prompts.  Office hours are 8:00 a.m. to 4:30 p.m. Monday - Friday. Please note that voicemails left after 4:00 p.m. may not be returned until the following business day.  We are closed weekends and major holidays. You have access to a nurse at all times for urgent questions. Please call the main number to the clinic [phone] and follow the prompts.  For any non-urgent questions, you may also contact your provider using MyChart. We now offer e-Visits for anyone 53 and older to request care online for non-urgent symptoms. For details visit [email or web address].   Also download the MyChart app! Go to the app store, search "MyChart", open the app, select [place], and log in with your MyChart username and password.

## 2023-05-25 NOTE — Progress Notes (Signed)
 Lauren Ortega presents today for injection per the provider's orders. Lupron   administration without incident; injection site WNL; see MAR for injection details.  Patient tolerated procedure well and without incident.  No questions or complaints noted at this time.   Discharged from clinic ambulatory in stable condition. Alert and oriented x 3. F/U with [facility] as scheduled.

## 2023-05-26 LAB — CANCER ANTIGEN 15-3: CA 15-3: 7.4 U/mL (ref 0.0–25.0)

## 2023-05-26 LAB — CANCER ANTIGEN 27.29: CA 27.29: 9.5 U/mL (ref 0.0–38.6)

## 2023-06-01 ENCOUNTER — Ambulatory Visit: Payer: PRIVATE HEALTH INSURANCE | Admitting: Hematology

## 2023-06-04 NOTE — Progress Notes (Signed)
 [facility] [address], [place] [address]    Clinic Day:  06/05/2023  Referring physician: Dia Forget, FNP  Patient Care Team: Lauren Forget, FNP as PCP - General (Family Medicine) Lauren Knuckles, RN as Oncology Nurse Navigator (Oncology)   ASSESSMENT & PLAN:   Assessment: 1.  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  She had mammogram/ultrasound on 03/18/2020 done in [place] which showed a 1.4 x 0.9 x 1.5 cm mass in the upper outer quadrant of the right breast. -Biopsy on 03/31/2020 consistent with intermediate to high-grade infiltrating ductal carcinoma of the right breast at 10:30 position, HER-2 3+ positive, Ki-67 40%, ER weak staining in less than 10% of tumor cells, PR negative. -MRI of the breast on 04/20/2020 with 1.3 x 1.5 x 1.4 cm irregular enhancing mass within the upper outer right breast.  No abnormal appearing lymph nodes. - Right breast lumpectomy and SLNB on 05/20/2020- grade 3 IDC, 1.8 cm, invasive carcinoma is less than 1 mm from anterior margin focally and less than 1 mm from the posterior margin broadly.  Margins negative for in situ carcinoma.  Lymphovascular invasion present.  0/5 lymph nodes positive.  There is focal ductal carcinoma within the vascular space in the soft tissue adjacent to the lymph node.  No carcinoma is seen within the lymph nodes.  PT1CPN0. - Genetic testing was negative. - 6 cycles of TCH from 06/23/2020 through 10/07/2020. - XRT to the breast, 20 fractions completed on 01/15/2021 with Dr. Charl Concha in [place]. - Tamoxifen  was discontinued secondary to leg pains. - Estradiol  and FSH levels were in the postmenopausal range.  She did not have any manses since the start of chemotherapy in June 2022. - Anastrozole  started on 03/24/2021.   2.  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  Never smoker. -Maternal grandmother had colon cancer.  Maternal grandfather died of non-Hodgkin's lymphoma.  Paternal grandfather died of lung cancer.  Maternal uncle had multiple myeloma.  Mother had basal cell skin cancer.  3.  Left upper extremity DVT: - Doppler on 07/06/2020 positive for acute DVT in the left axillary, peripheral subclavian vein and superficial thrombosis involving left basilic vein. - This is port induced and she was started on Eliquis .    Plan: 1.  Stage I (T1 cN0 M0) right breast IDC, HER2 positive: - She is tolerating anastrozole  reasonably well with some hot flashes. - Physical exam: Lumpectomy scar in the right breast around the areola within normal limits with no palpable masses or adenopathy. - Labs from 05/25/2023: Normal LFTs CBC and tumor markers. - Reviewed mammogram from 04/07/2023: BI-RADS Category 1. - Continue Lupron  injection every 3 months.  Continue anastrozole  daily.  RTC 6 months for follow-up.   2.  Hepatic lesion: - MRI of the liver on 05/25/2023 reviewed by me shows slightly diminished size of arterially hyperenhancing lesion in the posterior inferior right lobe of the liver, measuring 2.4 x 1.8 cm, previously 2.6 x 2.0 cm, suggestive of typical benign hepatic adenoma.  No evidence of metastatic disease. - No need to rescan her liver as the lesion has been stable over the last 2 years.  3.  Osteopenia (DEXA on 06/16/2021 T-score -1.1): - Vitamin D  level is 30.2.  She reportedly stopped taking vitamin D  recently.  She will go back on  it.  4.  Peripheral neuropathy: - She had neuropathy in the feet.  She had laminectomy at L2-3 and L4-5.  Neuropathy has gotten better since then.  She is not on any medications.    Orders Placed This Encounter  Procedures   DG Bone Density    Standing Status:   Future    Expected Date:   12/06/2023    Expiration Date:   06/04/2024    Reason for Exam (SYMPTOM  OR DIAGNOSIS REQUIRED):    anti-estrogen therapy    Is patient pregnant?:   No    Preferred imaging location?:   [facility]   CBC with Differential    Standing Status:   Future    Expected Date:   12/04/2023    Expiration Date:   06/04/2024   Comprehensive metabolic panel    Standing Status:   Future    Expected Date:   12/04/2023    Expiration Date:   06/04/2024   Cancer antigen 27.29    Standing Status:   Future    Expected Date:   12/04/2023    Expiration Date:   06/04/2024   Cancer antigen 15-3    Standing Status:   Future    Expected Date:   12/04/2023    Expiration Date:   06/04/2024   VITAMIN D  25 Hydroxy (Vit-D Deficiency, Fractures)    Standing Status:   Future    Expected Date:   12/04/2023    Expiration Date:   06/04/2024      I,Lauren Ortega,acting as a scribe for Lauren Boros, MD.,have documented all relevant documentation on the behalf of Lauren Boros, MD,as directed by  Lauren Boros, MD while in the presence of Lauren Boros, MD.   I, Lauren Boros MD, have reviewed the above documentation for accuracy and completeness, and I agree with the above.   Lauren Boros, MD   5/12/20258:39 AM  CHIEF COMPLAINT:   Diagnosis: right breast cancer    Cancer Staging  Malignant neoplasm of upper-outer quadrant of right female breast [facility]) Staging form: Breast, AJCC 8th Edition - Clinical stage from 04/21/2020: Stage IA (cT1c, cN0, cM0, G3, ER+, PR-, HER2+) - Unsigned    Prior Therapy: 1. Right breast lumpectomy and SLNB on 05/20/2020  2. 6 cycles of TCH from 06/23/2020 through 10/07/2020.  3. XRT to the breast, 20 fractions completed on 01/15/2021   Current Therapy:  anastrozole  and Lupron     HISTORY OF PRESENT ILLNESS:   Oncology History  Malignant neoplasm of upper-outer quadrant of right female breast (HCC)  04/16/2020 Initial Diagnosis   Malignant neoplasm of upper-outer quadrant of right female breast [facility])    Genetic Testing    Negative genetic testing. No pathogenic variants identified on the [facility] Multi-Cancer Panel+RNA. The report date is 05/10/2020.  The Multi-Cancer Panel + RNA offered by [facility] includes sequencing and/or deletion duplication testing of the following 84 genes: AIP, ALK, APC, ATM, AXIN2,BAP1,  BARD1, BLM, BMPR1A, BRCA1, BRCA2, BRIP1, CASR, CDC73, CDH1, CDK4, CDKN1B, CDKN1C, CDKN2A (p14ARF), CDKN2A (p16INK4a), CEBPA, CHEK2, CTNNA1, DICER1, DIS3L2, EGFR (c.2369C>T, p.Thr790Met variant only), EPCAM (Deletion/duplication testing only), FH, FLCN, GATA2, GPC3, GREM1 (Promoter region deletion/duplication testing only), HOXB13 (c.251G>A, p.Gly84Glu), HRAS, KIT, MAX, MEN1, MET, MITF (c.952G>A, p.Glu318Lys variant only), MLH1, MSH2, MSH3, MSH6, MUTYH, NBN, NF1, NF2, NTHL1, PALB2, PDGFRA, PHOX2B, PMS2, POLD1, POLE, POT1, PRKAR1A, PTCH1, PTEN, RAD50, RAD51C, RAD51D, RB1, RECQL4, RET, RUNX1, SDHAF2, SDHA (sequence changes only), SDHB, SDHC, SDHD, SMAD4, SMARCA4, SMARCB1, SMARCE1, STK11, SUFU, TERC, TERT,  TMEM127, TP53, TSC1, TSC2, VHL, WRN and WT1.   06/23/2020 - 05/26/2021 Chemotherapy   Patient is on Treatment Plan : BREAST Docetaxel  + Carboplatin  + Trastuzumab  (TCH) q21d / Trastuzumab  q21d     10/03/2022 -  Chemotherapy   Patient is on Treatment Plan : BREAST (RESEARCH) [id number] BUPROPION /PLACEBO        INTERVAL HISTORY:   Lauren Ortega is a 43 y.o. female presenting to clinic today for follow up of right breast cancer. She was last seen by me on 12/05/22.  Since her last visit, she underwent annual screening mammogram on 04/07/23, which was negative.  She also underwent liver MRI on 05/25/23 showing: slightly diminished size of lesion of posterior inferior right lobe of liver, currently 2.4 cm (previously 2.6 cm), with imaging characteristics typical of benign hepatic adenoma; no evidence of lymphadenopathy or metastatic disease in abdomen.  Today, she states that she is doing well overall. Her appetite level is at  100%. Her energy level is at 7 5%.  PAST MEDICAL HISTORY:   Past Medical History: Past Medical History:  Diagnosis Date   Anemia    Anxiety    Breast cancer (HCC) 03/2020   IDC grade 3 neg margins - Right   Depression    DVT (deep venous thrombosis) (HCC) 07/06/2020   left axillary/subclavian DVT in setting of left Snyder Port-a-cath, discontinued 06/19/21   Family history of colon cancer    Family history of lymphoma    Family history of pancreatic cancer    Family history of skin cancer    Heart murmur    Hypertension    Left bundle branch block (LBBB) 05/19/2020   Lumbar degenerative disc disease    Neuropathy due to chemotherapeutic drug [facility])    Personal history of chemotherapy 2022   Personal history of radiation therapy 2022    Surgical History: Past Surgical History:  Procedure Laterality Date   BREAST LUMPECTOMY Right 05/20/2020   BREAST LUMPECTOMY WITH RADIOACTIVE SEED AND SENTINEL LYMPH NODE BIOPSY Right 05/20/2020   Procedure: RIGHT BREAST LUMPECTOMY WITH RADIOACTIVE SEED AND SENTINEL LYMPH NODE BIOPSY;  Surgeon: Oza Blumenthal, MD;  Location: [facility];  Service: General;  Laterality: Right;   CESAREAN SECTION     x3   LUMBAR LAMINECTOMY/DECOMPRESSION MICRODISCECTOMY Left 01/13/2023   Procedure: Left - Lumbar Three-Lumbar Four - Lumbar Four-Lumbar Five with sublaminar decompression;  Surgeon: Joaquin Mulberry, MD;  Location[facility] OR;  Service: Neurosurgery;  Laterality: Left;  3C   PORT-A-CATH REMOVAL Left 06/19/2021   Procedure: REMOVAL PORT-A-CATH;  Surgeon: Anda Bamberg, MD;  Location: [facility] OR;  Service: General;  Laterality: Left;   PORTACATH PLACEMENT Left 05/20/2020   Procedure: INSERTION PORT-A-CATH;  Surgeon: Oza Blumenthal, MD;  Location: [facility];  Service: General;  Laterality: Left;   TONSILLECTOMY     TUBAL LIGATION      Social History: Social History   Socioeconomic History   Marital status: Divorced     Spouse name: Not on file   Number of children: Not on file   Years of education: Not on file   Highest education level: Not on file  Occupational History   Not on file  Tobacco Use   Smoking status: Never   Smokeless tobacco: Never  Vaping Use   Vaping status: Never Used  Substance and Sexual Activity   Alcohol use: Yes    Comment: occassionally   Drug use: Never   Sexual activity: Yes    Birth  control/protection: Surgical  Other Topics Concern   Not on file  Social History Narrative   Not on file   Social Drivers of Health   Financial Resource Strain: Low Risk  (04/21/2020)   Overall Financial Resource Strain (CARDIA)    Difficulty of Paying Living Expenses: Not hard at all  Food Insecurity: No Food Insecurity (04/21/2020)   Hunger Vital Sign    Worried About Running Out of Food in the Last Year: Never true    Ran Out of Food in the Last Year: Never true  Transportation Needs: No Transportation Needs (04/21/2020)   PRAPARE - Administrator, Civil Service (Medical): No    Lack of Transportation (Non-Medical): No  Physical Activity: Insufficiently Active (04/21/2020)   Exercise Vital Sign    Days of Exercise per Week: 2 days    Minutes of Exercise per Session: 20 min  Stress: No Stress Concern Present (04/21/2020)   [employer] of Occupational Health - Occupational Stress Questionnaire    Feeling of Stress : Not at all  Social Connections: Moderately Integrated (04/21/2020)   Social Connection and Isolation Panel [NHANES]    Frequency of Communication with Friends and Family: More than three times a week    Frequency of Social Gatherings with Friends and Family: More than three times a week    Attends Religious Services: 1 to 4 times per year    Active Member of [employer] or Organizations: No    Attends Engineer, structural: 1 to 4 times per year    Marital Status: Divorced  Catering manager Violence: Not At Risk (04/21/2020)   Humiliation, Afraid,  Rape, and Kick questionnaire    Fear of Current or Ex-Partner: No    Emotionally Abused: No    Physically Abused: No    Sexually Abused: No    Family History: Family History  Problem Relation Age of Onset   Basal cell carcinoma Mother 32   Bladder Cancer Maternal Uncle 72   Lymphoma Maternal Uncle 25       Waldenstroms    Pancreatic cancer Maternal Grandmother    Non-Hodgkin's lymphoma Maternal Grandfather    Lung cancer Paternal Grandfather    Colon cancer Other        x8 or 9   Colon cancer Maternal Great-grandfather     Current Medications:  Current Outpatient Medications:    acetaminophen  (TYLENOL ) 500 MG tablet, Take 2 tablets (1,000 mg total) by mouth 4 (four) times daily., Disp: 120 tablet, Rfl: 3   anastrozole  (ARIMIDEX ) 1 MG tablet, TAKE 1 TABLET BY MOUTH EVERY DAY, Disp: 90 tablet, Rfl: 1   Cholecalciferol (VITAMIN D3 PO), Take 1 tablet by mouth daily., Disp: , Rfl:    cyanocobalamin (VITAMIN B12) 1000 MCG tablet, TAKE 1 TABLET BY MOUTH EVERY DAY, Disp: 90 tablet, Rfl: 1   estradiol  (ESTRACE ) 0.1 MG/GM vaginal cream, PLACE 1 APPLICATORFUL VAGINALLY 2 (TWO) TIMES DAILY. TWICE DAILY FOR TWO WEEKS, THEN TWICE WEEKLY., Disp: 170 g, Rfl: 12   ferrous sulfate 325 (65 FE) MG EC tablet, Take 325 mg by mouth daily., Disp: , Rfl:    folic acid (FOLVITE) 1 MG tablet, Take 1 mg by mouth daily., Disp: , Rfl:    HYDROcodone-acetaminophen  (NORCO/VICODIN) 5-325 MG tablet, Take 1 tablet by mouth every 6 (six) hours as needed., Disp: , Rfl:    ibuprofen  (ADVIL ) 200 MG tablet, Take 800 mg by mouth every 6 (six) hours as needed for moderate pain (pain score 4-6).,  Disp: , Rfl:    ibuprofen  (ADVIL ) 600 MG tablet, Take 1 tablet (600 mg total) by mouth 4 (four) times daily., Disp: 120 tablet, Rfl: 1   leuprolide  (LUPRON ) 11.25 MG injection, Inject 11.25 mg into the muscle every 3 (three) months., Disp: , Rfl:    magnesium  oxide (MAG-OX) 400 (240 Mg) MG tablet, TAKE 1 TABLET BY MOUTH TWICE A  DAY IN THE MORNING AND IN THE EVENING, Disp: 180 tablet, Rfl: 2   cyclobenzaprine (FLEXERIL) 10 MG tablet, Take 10 mg by mouth every 8 (eight) hours as needed., Disp: , Rfl:    rosuvastatin (CRESTOR) 10 MG tablet, Take 1 tablet by mouth daily., Disp: , Rfl:    Allergies: Allergies  Allergen Reactions   Iodine Itching and Rash    Unsure of this- was a "red prep"    REVIEW OF SYSTEMS:   Review of Systems  All other systems reviewed and are negative.    VITALS:   Blood pressure 136/83, pulse 81, temperature 97.7 F (36.5 C), temperature source Oral, resp. rate 18, weight 153 lb (69.4 kg), last menstrual period 05/17/2020, SpO2 98%.  Wt Readings from Last 3 Encounters:  06/05/23 153 lb (69.4 kg)  01/13/23 145 lb (65.8 kg)  01/09/23 145 lb 6.4 oz (66 kg)    Body mass index is 29.88 kg/m.  Performance status (ECOG): 1 - Symptomatic but completely ambulatory  PHYSICAL EXAM:   Physical Exam Vitals reviewed.  Constitutional:      Appearance: Normal appearance.  Cardiovascular:     Rate and Rhythm: Normal rate and regular rhythm.     Pulses: Normal pulses.     Heart sounds: Normal heart sounds.  Pulmonary:     Effort: Pulmonary effort is normal.     Breath sounds: Normal breath sounds.  Abdominal:     Palpations: Abdomen is soft. There is no mass.  Musculoskeletal:     Right lower leg: No edema.     Left lower leg: No edema.  Neurological:     General: No focal deficit present.     Mental Status: She is alert and oriented to person, place, and time.  Psychiatric:        Mood and Affect: Mood normal.        Behavior: Behavior normal.     LABS:   CBC     Component Value Date/Time   WBC 8.0 05/25/2023 1258   RBC 4.25 05/25/2023 1258   HGB 13.2 05/25/2023 1258   HCT 38.9 05/25/2023 1258   PLT 358 05/25/2023 1258   MCV 91.5 05/25/2023 1258   MCH 31.1 05/25/2023 1258   MCHC 33.9 05/25/2023 1258   RDW 13.1 05/25/2023 1258   LYMPHSABS 1.4 05/25/2023 1258    MONOABS 0.6 05/25/2023 1258   EOSABS 0.1 05/25/2023 1258   BASOSABS 0.1 05/25/2023 1258    CMP      Component Value Date/Time   NA 138 05/25/2023 1258   K 3.9 05/25/2023 1258   CL 102 05/25/2023 1258   CO2 24 05/25/2023 1258   GLUCOSE 96 05/25/2023 1258   BUN 15 05/25/2023 1258   CREATININE 0.63 05/25/2023 1258   CALCIUM 10.1 05/25/2023 1258   PROT 7.3 05/25/2023 1258   ALBUMIN 4.3 05/25/2023 1258   AST 24 05/25/2023 1258   ALT 26 05/25/2023 1258   ALKPHOS 94 05/25/2023 1258   BILITOT 0.7 05/25/2023 1258   GFRNONAA >60 05/25/2023 1258     No results found for: "CEA1", "CEA" /  No results found for: "CEA1", "CEA" No results found for: "PSA1" No results found for: "WUX324" No results found for: "CAN125"  No results found for: "TOTALPROTELP", "ALBUMINELP", "A1GS", "A2GS", "BETS", "BETA2SER", "GAMS", "MSPIKE", "SPEI" Lab Results  Component Value Date   TIBC 428 11/25/2022   TIBC 456 (H) 04/20/2022   TIBC 475 (H) 10/12/2021   FERRITIN 60 11/25/2022   FERRITIN 44 04/20/2022   FERRITIN 98 10/12/2021   IRONPCTSAT 24 11/25/2022   IRONPCTSAT 19 04/20/2022   IRONPCTSAT 32 (H) 10/12/2021   No results found for: "LDH"   STUDIES:   MR LIVER W WO CONTRAST Result Date: 05/25/2023 CLINICAL DATA:  Follow-up liver lesion, history of breast cancer EXAM: MRI ABDOMEN WITHOUT AND WITH CONTRAST TECHNIQUE: Multiplanar multisequence MR imaging of the abdomen was performed both before and after the administration of intravenous contrast. CONTRAST:  Eovist  contrast, quantity unspecified. Please refer to technologist documentation for complete details of contrast administration. COMPARISON:  04/20/2022 FINDINGS: Lower chest: No acute abnormality. Hepatobiliary: Slightly diminished size of an arterially hyperenhancing diffusion restricting T2 hyperintense lesion of the posterior inferior right lobe of the liver, hepatic segment VI, measuring 2.4 x 1.8 cm on today's examination, previously 2.6 x 2.0  cm (series 8, image 63). On multiphasic sequences, this lesion fades towards parenchymal background, and on delayed 20 minute hepatobiliary phase, this is markedly hypointense to parenchyma (series 22, image 65). No gallstones, gallbladder wall thickening, or biliary dilatation. Pancreas: Unremarkable. No pancreatic ductal dilatation or surrounding inflammatory changes. Spleen: Normal in size without significant abnormality. Adrenals/Urinary Tract: Adrenal glands are unremarkable. Kidneys are normal, without obvious renal calculi, solid lesion, or hydronephrosis. Stomach/Bowel: Stomach is within normal limits. No evidence of bowel wall thickening, distention, or inflammatory changes. Vascular/Lymphatic: No significant vascular findings are present. No enlarged abdominal lymph nodes. Other: No abdominal wall hernia or abnormality. No ascites. Musculoskeletal: No acute or significant osseous findings. IMPRESSION: 1. Slightly diminished size of an arterially hyperenhancing diffusion restricting T2 hyperintense lesion of the posterior inferior right lobe of the liver, hepatic segment VI, measuring 2.4 x 1.8 cm on today's examination, previously 2.6 x 2.0 cm. On multiphasic sequences, this lesion fades towards parenchymal background, and on delayed 20 minute hepatobiliary phase, this is markedly hypointense to parenchyma. Imaging characteristics on today's examination are typical of benign hepatic adenoma, and specifically not suggestive of a metastasis. Further, behavior over time is typical of hepatic adenoma in the setting of estrogen withdrawal or suppression. 2. No evidence of lymphadenopathy or metastatic disease in the abdomen. Electronically Signed   By: Fredricka Jenny M.D.   On: 05/25/2023 17:39

## 2023-06-05 ENCOUNTER — Inpatient Hospital Stay (HOSPITAL_BASED_OUTPATIENT_CLINIC_OR_DEPARTMENT_OTHER): Payer: PRIVATE HEALTH INSURANCE | Admitting: Hematology

## 2023-06-05 VITALS — BP 136/83 | HR 81 | Temp 97.7°F | Resp 18 | Wt 153.0 lb

## 2023-06-05 DIAGNOSIS — C50411 Malignant neoplasm of upper-outer quadrant of right female breast: Secondary | ICD-10-CM | POA: Diagnosis not present

## 2023-06-05 DIAGNOSIS — Z170 Estrogen receptor positive status [ER+]: Secondary | ICD-10-CM

## 2023-06-05 DIAGNOSIS — Z79899 Other long term (current) drug therapy: Secondary | ICD-10-CM

## 2023-06-05 NOTE — Patient Instructions (Signed)
 [facility] at [facility] Discharge Instructions   You were seen and examined today by Dr. Cheree Cords.  He reviewed the results of your lab work which are normal/stable.   He reviewed the results of your MRI which was stable/benign.   We will see you back in 6 months. We will repeat lab work and a bone density test prior to this visit.    Return as scheduled.    Thank you for choosing [facility] at [facility] to provide your oncology and hematology care.  To afford each patient quality time with our provider, please arrive at least 15 minutes before your scheduled appointment time.   If you have a lab appointment with the Cancer Center please come in thru the Main Entrance and check in at the main information desk.  You need to re-schedule your appointment should you arrive 10 or more minutes late.  We strive to give you quality time with our providers, and arriving late affects you and other patients whose appointments are after yours.  Also, if you no show three or more times for appointments you may be dismissed from the clinic at the providers discretion.     Again, thank you for choosing [facility].  Our hope is that these requests will decrease the amount of time that you wait before being seen by our physicians.       _____________________________________________________________  Should you have questions after your visit to [facility], please contact our office at [phone] and follow the prompts.  Our office hours are 8:00 a.m. and 4:30 p.m. Monday - Friday.  Please note that voicemails left after 4:00 p.m. may not be returned until the following business day.  We are closed weekends and major holidays.  You do have access to a nurse 24-7, just call the main number to the clinic [phone] and do not press any options, hold on the line and a nurse will answer the phone.    For prescription refill  requests, have your pharmacy contact our office and allow 72 hours.    Due to Covid, you will need to wear a mask upon entering the hospital. If you do not have a mask, a mask will be given to you at the Main Entrance upon arrival. For doctor visits, patients may have 1 support person age 81 or older with them. For treatment visits, patients can not have anyone with them due to social distancing guidelines and our immunocompromised population.

## 2023-06-06 ENCOUNTER — Other Ambulatory Visit: Payer: Self-pay

## 2023-07-03 ENCOUNTER — Ambulatory Visit: Payer: PRIVATE HEALTH INSURANCE | Attending: Family Medicine

## 2023-07-03 VITALS — Wt 152.2 lb

## 2023-07-03 DIAGNOSIS — Z483 Aftercare following surgery for neoplasm: Secondary | ICD-10-CM | POA: Insufficient documentation

## 2023-07-03 NOTE — Therapy (Signed)
 OUTPATIENT PHYSICAL THERAPY SOZO SCREENING NOTE   Patient Name: Lauren Ortega MRN: [id number] DOB:25-May-1980, 43 y.o., female Today's Date: 07/03/2023  PCP: Dia Forget, FNP REFERRING PROVIDER: Dia Forget, FNP   PT End of Session - 07/03/23 1642     Visit Number 7   # unchanged due to screen only   PT Start Time 1641    PT Stop Time 1645    PT Time Calculation (min) 4 min    Activity Tolerance Patient tolerated treatment well    Behavior During Therapy [facility] for tasks assessed/performed             Past Medical History:  Diagnosis Date   Anemia    Anxiety    Breast cancer (HCC) 03/2020   IDC grade 3 neg margins - Right   Depression    DVT (deep venous thrombosis) (HCC) 07/06/2020   left axillary/subclavian DVT in setting of left [place] Port-a-cath, discontinued 06/19/21   Family history of colon cancer    Family history of lymphoma    Family history of pancreatic cancer    Family history of skin cancer    Heart murmur    Hypertension    Left bundle branch block (LBBB) 05/19/2020   Lumbar degenerative disc disease    Neuropathy due to chemotherapeutic drug [facility])    Personal history of chemotherapy 2022   Personal history of radiation therapy 2022   Past Surgical History:  Procedure Laterality Date   BREAST LUMPECTOMY Right 05/20/2020   BREAST LUMPECTOMY WITH RADIOACTIVE SEED AND SENTINEL LYMPH NODE BIOPSY Right 05/20/2020   Procedure: RIGHT BREAST LUMPECTOMY WITH RADIOACTIVE SEED AND SENTINEL LYMPH NODE BIOPSY;  Surgeon: Oza Blumenthal, MD;  Location: [facility];  Service: General;  Laterality: Right;   CESAREAN SECTION     x3   LUMBAR LAMINECTOMY/DECOMPRESSION MICRODISCECTOMY Left 01/13/2023   Procedure: Left - Lumbar Three-Lumbar Four - Lumbar Four-Lumbar Five with sublaminar decompression;  Surgeon: Joaquin Mulberry, MD;  Location: [facility] OR;  Service: Neurosurgery;  Laterality: Left;  3C   PORT-A-CATH REMOVAL Left 06/19/2021   Procedure:  REMOVAL PORT-A-CATH;  Surgeon: Anda Bamberg, MD;  Location: [facility] OR;  Service: General;  Laterality: Left;   PORTACATH PLACEMENT Left 05/20/2020   Procedure: INSERTION PORT-A-CATH;  Surgeon: Oza Blumenthal, MD;  Location: [facility];  Service: General;  Laterality: Left;   TONSILLECTOMY     TUBAL LIGATION     Patient Active Problem List   Diagnosis Date Noted   S/P lumbar laminectomy 01/13/2023   Cellulitis 06/19/2021   HTN (hypertension) 06/19/2021   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) 06/19/2021   Anxiety and depression 06/19/2021   Iron deficiency anemia 05/04/2021   Port-A-Cath in place 06/16/2020   Genetic testing 05/12/2020   Family history of pancreatic cancer    Family history of skin cancer    Family history of lymphoma    Family history of colon cancer    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 Lucienne Ryder, HTN, DDD lumbar spine, Developed multiple bloodclots in LUE due to portacath which are now resolved.  Swelling from bloodclots has not resolved.   PRECAUTIONS: right UE  Lymphedema risk, None  SUBJECTIVE: Pt returns for her 6 month L-Dex screen.  "I'm back to working out after my back surgery in Dec but I've started noticing some lateral breast swelling. What can I do for that?"  PAIN:  Are you having pain? No  SOZO SCREENING: Patient was assessed today using the SOZO machine to determine the lymphedema index score. This was compared to her baseline score. It was determined that she is within the recommended range when compared to her baseline and no further action is needed at this time. She will continue  SOZO screenings. These are done every 3 months for 2 years post operatively followed by every 6 months for 2 years, and then annually.  Educated pt to call [employer] [place] for compression bras to wear during working out and to sleep in them as well.    L-DEX FLOWSHEETS - 07/03/23 1600       L-DEX LYMPHEDEMA SCREENING   Measurement Type Unilateral    L-DEX MEASUREMENT EXTREMITY Upper Extremity    POSITION  Standing    DOMINANT SIDE Right    At Risk Side Right    BASELINE SCORE (UNILATERAL) 0.1    L-DEX SCORE (UNILATERAL) -5    VALUE CHANGE (UNILAT) -5.1              Denyce Flank, PTA 07/03/2023, 4:48 PM

## 2023-07-04 ENCOUNTER — Other Ambulatory Visit: Payer: Self-pay

## 2023-07-06 NOTE — Telephone Encounter (Signed)
**Note De-identified  [place] Obfuscation** Please advise 

## 2023-08-28 ENCOUNTER — Inpatient Hospital Stay: Payer: PRIVATE HEALTH INSURANCE

## 2023-08-29 ENCOUNTER — Inpatient Hospital Stay: Payer: PRIVATE HEALTH INSURANCE | Attending: Hematology

## 2023-08-29 ENCOUNTER — Encounter: Payer: Self-pay | Admitting: Hematology

## 2023-08-29 VITALS — BP 132/86 | HR 87 | Temp 98.3°F | Resp 18

## 2023-08-29 DIAGNOSIS — Z79811 Long term (current) use of aromatase inhibitors: Secondary | ICD-10-CM | POA: Insufficient documentation

## 2023-08-29 DIAGNOSIS — Z170 Estrogen receptor positive status [ER+]: Secondary | ICD-10-CM | POA: Insufficient documentation

## 2023-08-29 DIAGNOSIS — C50411 Malignant neoplasm of upper-outer quadrant of right female breast: Secondary | ICD-10-CM | POA: Diagnosis present

## 2023-08-29 DIAGNOSIS — Z9221 Personal history of antineoplastic chemotherapy: Secondary | ICD-10-CM | POA: Insufficient documentation

## 2023-08-29 DIAGNOSIS — Z79899 Other long term (current) drug therapy: Secondary | ICD-10-CM | POA: Diagnosis not present

## 2023-08-29 DIAGNOSIS — D509 Iron deficiency anemia, unspecified: Secondary | ICD-10-CM

## 2023-08-29 MED ORDER — LEUPROLIDE ACETATE (3 MONTH) 11.25 MG IM KIT
11.2500 mg | PACK | Freq: Once | INTRAMUSCULAR | Status: AC
  Administered 2023-08-29: 11.25 mg via INTRAMUSCULAR
  Filled 2023-08-29: qty 11.25

## 2023-08-29 NOTE — Progress Notes (Signed)
Patient tolerated Lupron injection with no complaints voiced.  Site clean and dry with no bruising or swelling noted.  No complaints of pain.  Discharged with vital signs stable and no signs or symptoms of distress noted.

## 2023-08-29 NOTE — Patient Instructions (Signed)
 [facility] - A DEPT OF [facility]  Discharge Instructions: Thank you for choosing  Cancer Center to provide your oncology and hematology care.  If you have a lab appointment with the Cancer Center - please note that after April 8th, 2024, all labs will be drawn in the cancer center.  You do not have to check in or register with the main entrance as you have in the past but will complete your check-in in the cancer center.  Wear comfortable clothing and clothing appropriate for easy access to any Portacath or PICC line.   We strive to give you quality time with your provider. You may need to reschedule your appointment if you arrive late (15 or more minutes).  Arriving late affects you and other patients whose appointments are after yours.  Also, if you miss three or more appointments without notifying the office, you may be dismissed from the clinic at the provider's discretion.      For prescription refill requests, have your pharmacy contact our office and allow 72 hours for refills to be completed.    Today you received the following chemotherapy and/or immunotherapy agents: Lupron.   Leuprolide Solution for Injection What is this medication? LEUPROLIDE (loo PROE lide) reduces the symptoms of prostate cancer. It works by decreasing levels of the hormone testosterone in the body. This prevents prostate cancer cells from spreading or growing. This medicine may be used for other purposes; ask your health care provider or pharmacist if you have questions. COMMON BRAND NAME(S): Lupron What should I tell my care team before I take this medication? They need to know if you have any of these conditions: Diabetes Heart attack Heart disease High blood pressure High cholesterol Pain or difficulty passing urine Spinal cord metastasis Stroke Tobacco use An unusual or allergic reaction to leuprolide, other medications, foods, dyes, or  preservatives Pregnant or trying to get pregnant Breast-feeding How should I use this medication? This medication is for injection under the skin or into a muscle. You will be taught how to prepare and give this medication. Use exactly as directed. Take your medication at regular intervals. Do not take it more often than directed. It is important that you put your used needles and syringes in a special sharps container. Do not put them in a trash can. If you do not have a sharps container, call your care team to get one. A special MedGuide will be given to you by the pharmacist with each prescription and refill. Be sure to read this information carefully each time. Talk to your care team about the use of this medication in children. While this medication may be prescribed for children as young as 8 years for selected conditions, precautions do apply. Overdosage: If you think you have taken too much of this medicine contact a poison control center or emergency room at once. NOTE: This medicine is only for you. Do not share this medicine with others. What if I miss a dose? If you miss a dose, take it as soon as you can. If it is almost time for your next dose, take only that dose. Do not take double or extra doses. What may interact with this medication? Do not take this medication with any of the following: Chasteberry Cisapride Dronedarone Pimozide Thioridazine This medication may also interact with the following: Estrogen or progestin hormones Herbal or dietary supplements, like black cohosh or DHEA Other medications that cause heart rhythm  changes Testosterone This list may not describe all possible interactions. Give your health care provider a list of all the medicines, herbs, non-prescription drugs, or dietary supplements you use. Also tell them if you smoke, drink alcohol, or use illegal drugs. Some items may interact with your medicine. What should I watch for while using this  medication? Visit your care team for regular checks on your progress. During the first week, your symptoms may get worse, but then will improve as you continue your treatment. You may get hot flashes, increased bone pain, increased difficulty passing urine, or an aggravation of nerve symptoms. Discuss these effects with your care team, some of them may improve with continued use of this medication. Patients may experience a menstrual cycle or spotting during the first 2 months of therapy with this medication. If this continues, contact your care team. This medication may increase blood sugar. The risk may be higher in patients who already have diabetes. Ask your care team what you can do to lower your risk of diabetes while taking this medication. What side effects may I notice from receiving this medication? Side effects that you should report to your care team as soon as possible: Allergic reactions--skin rash, itching, hives, swelling of the face, lips, tongue, or throat Heart attack--pain or tightness in the chest, shoulders, arms, or jaw, nausea, shortness of breath, cold or clammy skin, feeling faint or lightheaded Heart rhythm changes--fast or irregular heartbeat, dizziness, feeling faint or lightheaded, chest pain, trouble breathing High blood sugar (hyperglycemia)--increased thirst or amount of urine, unusual weakness or fatigue, blurry vision New or worsening seizures Redness, blistering, peeling, or loosening of the skin, including inside the mouth Stroke--sudden numbness or weakness of the face, arm, or leg, trouble speaking, confusion, trouble walking, loss of balance or coordination, dizziness, severe headache, change in vision Swelling and pain of the tumor site or lymph nodes Side effects that usually do not require medical attention (report these to your care team if they continue or are bothersome): Change in sex drive or performance Hot flashes Joint pain Pain, redness, or  irritation at injection site Swelling of the ankles, hands, or feet Unusual weakness or fatigue This list may not describe all possible side effects. Call your doctor for medical advice about side effects. You may report side effects to FDA at 1-800-FDA-1088. Where should I keep my medication? Keep out of the reach of children and pets. Store below 25 degrees C (77 degrees F). Do not freeze. Protect from light. Get rid of any unused medication after the expiration date. To get rid of medications that are no longer needed or have expired: Take the medication to a medication take-back program. Check with your pharmacy or law enforcement to find a location. If you cannot return the medication, ask your pharmacist or care team how to get rid of this medication safely. NOTE: This sheet is a summary. It may not cover all possible information. If you have questions about this medicine, talk to your doctor, pharmacist, or health care provider.  2024 Elsevier/Gold Standard (2022-12-23 00:00:00)       To help prevent nausea and vomiting after your treatment, we encourage you to take your nausea medication as directed.  BELOW ARE SYMPTOMS THAT SHOULD BE REPORTED IMMEDIATELY: *FEVER GREATER THAN 100.4 F (38 C) OR HIGHER *CHILLS OR SWEATING *NAUSEA AND VOMITING THAT IS NOT CONTROLLED WITH YOUR NAUSEA MEDICATION *UNUSUAL SHORTNESS OF BREATH *UNUSUAL BRUISING OR BLEEDING *URINARY PROBLEMS (pain or burning when urinating, or  frequent urination) *BOWEL PROBLEMS (unusual diarrhea, constipation, pain near the anus) TENDERNESS IN MOUTH AND THROAT WITH OR WITHOUT PRESENCE OF ULCERS (sore throat, sores in mouth, or a toothache) UNUSUAL RASH, SWELLING OR PAIN  UNUSUAL VAGINAL DISCHARGE OR ITCHING   Items with * indicate a potential emergency and should be followed up as soon as possible or go to the Emergency Department if any problems should occur.  Please show the CHEMOTHERAPY ALERT CARD or IMMUNOTHERAPY  ALERT CARD at check-in to the Emergency Department and triage nurse.  Should you have questions after your visit or need to cancel or reschedule your appointment, please contact [facility] [place] - A DEPT OF [facility] [phone]  and follow the prompts.  Office hours are 8:00 a.m. to 4:30 p.m. Monday - Friday. Please note that voicemails left after 4:00 p.m. may not be returned until the following business day.  We are closed weekends and major holidays. You have access to a nurse at all times for urgent questions. Please call the main number to the clinic [phone] and follow the prompts.  For any non-urgent questions, you may also contact your provider using MyChart. We now offer e-Visits for anyone 17 and older to request care online for non-urgent symptoms. For details visit [email or web address].   Also download the MyChart app! Go to the app store, search "MyChart", open the app, select [facility], and log in with your MyChart username and password.

## 2023-09-27 ENCOUNTER — Other Ambulatory Visit: Payer: Self-pay | Admitting: Medical Genetics

## 2023-09-28 ENCOUNTER — Other Ambulatory Visit (HOSPITAL_COMMUNITY)
Admission: RE | Admit: 2023-09-28 | Discharge: 2023-09-28 | Disposition: A | Discharge status: Home / Self Care | Payer: Self-pay | Source: Ambulatory Visit | Attending: Medical Genetics | Admitting: Medical Genetics

## 2023-10-04 ENCOUNTER — Encounter: Payer: Self-pay | Admitting: Physician Assistant

## 2023-10-06 LAB — GENECONNECT MOLECULAR SCREEN: Genetic Analysis Overall Interpretation: NEGATIVE

## 2023-10-24 ENCOUNTER — Other Ambulatory Visit: Payer: Self-pay | Admitting: *Deleted

## 2023-10-24 ENCOUNTER — Encounter: Payer: Self-pay | Admitting: Physician Assistant

## 2023-10-24 MED ORDER — ANASTROZOLE 1 MG PO TABS: 1.0000 mg | ORAL_TABLET | Freq: Every day | ORAL | 1 refills | Status: AC

## 2023-11-29 ENCOUNTER — Inpatient Hospital Stay: Payer: PRIVATE HEALTH INSURANCE | Attending: Hematology

## 2023-11-29 ENCOUNTER — Inpatient Hospital Stay: Payer: PRIVATE HEALTH INSURANCE

## 2023-11-29 ENCOUNTER — Ambulatory Visit (HOSPITAL_COMMUNITY)
Admission: RE | Admit: 2023-11-29 | Discharge: 2023-11-29 | Disposition: A | Discharge status: Home / Self Care | Payer: PRIVATE HEALTH INSURANCE | Source: Ambulatory Visit | Attending: Hematology | Admitting: Hematology

## 2023-11-29 VITALS — BP 130/78 | HR 80 | Temp 98.3°F | Resp 20

## 2023-11-29 DIAGNOSIS — Z923 Personal history of irradiation: Secondary | ICD-10-CM | POA: Diagnosis not present

## 2023-11-29 DIAGNOSIS — D509 Iron deficiency anemia, unspecified: Secondary | ICD-10-CM

## 2023-11-29 DIAGNOSIS — C50411 Malignant neoplasm of upper-outer quadrant of right female breast: Secondary | ICD-10-CM | POA: Diagnosis present

## 2023-11-29 DIAGNOSIS — Z79811 Long term (current) use of aromatase inhibitors: Secondary | ICD-10-CM | POA: Insufficient documentation

## 2023-11-29 DIAGNOSIS — Z79899 Other long term (current) drug therapy: Secondary | ICD-10-CM | POA: Insufficient documentation

## 2023-11-29 DIAGNOSIS — Z9221 Personal history of antineoplastic chemotherapy: Secondary | ICD-10-CM | POA: Diagnosis not present

## 2023-11-29 DIAGNOSIS — Z170 Estrogen receptor positive status [ER+]: Secondary | ICD-10-CM | POA: Diagnosis not present

## 2023-11-29 LAB — CBC WITH DIFFERENTIAL/PLATELET
Abs Immature Granulocytes: 0.04 K/uL (ref 0.00–0.07)
Basophils Absolute: 0.1 K/uL (ref 0.0–0.1)
Basophils Relative: 1 %
Eosinophils Absolute: 0.2 K/uL (ref 0.0–0.5)
Eosinophils Relative: 2 %
HCT: 41.6 % (ref 36.0–46.0)
Hemoglobin: 14.1 g/dL (ref 12.0–15.0)
Immature Granulocytes: 1 %
Lymphocytes Relative: 22 %
Lymphs Abs: 1.5 K/uL (ref 0.7–4.0)
MCH: 31.1 pg (ref 26.0–34.0)
MCHC: 33.9 g/dL (ref 30.0–36.0)
MCV: 91.6 fL (ref 80.0–100.0)
Monocytes Absolute: 0.6 K/uL (ref 0.1–1.0)
Monocytes Relative: 10 %
Neutro Abs: 4.2 K/uL (ref 1.7–7.7)
Neutrophils Relative %: 64 %
Platelets: 311 K/uL (ref 150–400)
RBC: 4.54 MIL/uL (ref 3.87–5.11)
RDW: 12.9 % (ref 11.5–15.5)
WBC: 6.6 K/uL (ref 4.0–10.5)
nRBC: 0 % (ref 0.0–0.2)

## 2023-11-29 LAB — COMPREHENSIVE METABOLIC PANEL WITH GFR
ALT: 29 U/L (ref 0–44)
AST: 28 U/L (ref 15–41)
Albumin: 4.5 g/dL (ref 3.5–5.0)
Alkaline Phosphatase: 105 U/L (ref 38–126)
Anion gap: 10 (ref 5–15)
BUN: 13 mg/dL (ref 6–20)
CO2: 28 mmol/L (ref 22–32)
Calcium: 9.5 mg/dL (ref 8.9–10.3)
Chloride: 102 mmol/L (ref 98–111)
Creatinine, Ser: 0.77 mg/dL (ref 0.44–1.00)
GFR, Estimated: >60 mL/min (ref >60)
Glucose, Bld: 128 mg/dL — ABNORMAL HIGH (ref 70–99)
Potassium: 4.1 mmol/L (ref 3.5–5.1)
Sodium: 140 mmol/L (ref 135–145)
Total Bilirubin: 0.4 mg/dL (ref 0.0–1.2)
Total Protein: 7.4 g/dL (ref 6.5–8.1)

## 2023-11-29 LAB — VITAMIN D 25 HYDROXY (VIT D DEFICIENCY, FRACTURES): Vit D, 25-Hydroxy: 43.98 ng/mL (ref 30–100)

## 2023-11-29 MED ORDER — LEUPROLIDE ACETATE (3 MONTH) 11.25 MG IM KIT
11.2500 mg | PACK | Freq: Once | INTRAMUSCULAR | Status: AC
  Administered 2023-11-29: 11.25 mg via INTRAMUSCULAR
  Filled 2023-11-29: qty 11.25

## 2023-11-29 NOTE — Progress Notes (Signed)
 Carol Theys presents today for injection per the provider's orders.  Lupron  administration without incident; injection site WNL; see MAR for injection details.  Patient tolerated procedure well and without incident.  No questions or complaints noted at this time.   Discharged from clinic ambulatory in stable come.condition. Alert and oriented x 3. F/U with [facility] as scheduled.

## 2023-11-29 NOTE — Patient Instructions (Signed)
 [facility] [place] - A DEPT OF [facility]  Discharge Instructions: Thank you for choosing [facility] to provide your oncology and hematology care.  If you have a lab appointment with the Cancer Center - please note that after April 8th, 2024, all labs will be drawn in the cancer center.  You do not have to check in or register with the main entrance as you have in the past but will complete your check-in in the cancer center.  Wear comfortable clothing and clothing appropriate for easy access to any Portacath or PICC line.   We strive to give you quality time with your provider. You may need to reschedule your appointment if you arrive late (15 or more minutes).  Arriving late affects you and other patients whose appointments are after yours.  Also, if you miss three or more appointments without notifying the office, you may be dismissed from the clinic at the provider's discretion.      For prescription refill requests, have your pharmacy contact our office and allow 72 hours for refills to be completed.    Today you received Lupron  injection.     BELOW ARE SYMPTOMS THAT SHOULD BE REPORTED IMMEDIATELY: *FEVER GREATER THAN 100.4 F (38 C) OR HIGHER *CHILLS OR SWEATING *NAUSEA AND VOMITING THAT IS NOT CONTROLLED WITH YOUR NAUSEA MEDICATION *UNUSUAL SHORTNESS OF BREATH *UNUSUAL BRUISING OR BLEEDING *URINARY PROBLEMS (pain or burning when urinating, or frequent urination) *BOWEL PROBLEMS (unusual diarrhea, constipation, pain near the anus) TENDERNESS IN MOUTH AND THROAT WITH OR WITHOUT PRESENCE OF ULCERS (sore throat, sores in mouth, or a toothache) UNUSUAL RASH, SWELLING OR PAIN  UNUSUAL VAGINAL DISCHARGE OR ITCHING   Items with * indicate a potential emergency and should be followed up as soon as possible or go to the Emergency Department if any problems should occur.  Please show the CHEMOTHERAPY ALERT CARD or IMMUNOTHERAPY ALERT CARD at check-in  to the Emergency Department and triage nurse.  Should you have questions after your visit or need to cancel or reschedule your appointment, please contact [facility] [employer] - A DEPT OF [facility] [phone]  and follow the prompts.  Office hours are 8:00 a.m. to 4:30 p.m. Monday - Friday. Please note that voicemails left after 4:00 p.m. may not be returned until the following business day.  We are closed weekends and major holidays. You have access to a nurse at all times for urgent questions. Please call the main number to the clinic [phone] and follow the prompts.  For any non-urgent questions, you may also contact your provider using MyChart. We now offer e-Visits for anyone 53 and older to request care online for non-urgent symptoms. For details visit [email or web address].   Also download the MyChart app! Go to the app store, search "MyChart", open the app, select [place], and log in with your MyChart username and password.

## 2023-11-30 LAB — CANCER ANTIGEN 15-3: CA 15-3: 8.5 U/mL (ref 0.0–25.0)

## 2023-11-30 LAB — CANCER ANTIGEN 27.29: CA 27.29: 12.6 U/mL (ref 0.0–38.6)

## 2023-12-04 NOTE — Progress Notes (Addendum)
 [facility] [address] [place], [place] [address]   CLINIC:  Medical Oncology/Hematology  PCP:  Waddell Mitzie SAUNDERS, FNP [address] / [place] [address] [phone]   REASON FOR VISIT:  Follow-up for stage I RIGHT breast cancer, HER2 positive  PRIOR THERAPY: - Right breast lumpectomy/SLNB on 05/20/2020 - TCH x 6 cycles from 06/23/2020 through 10/07/2020 - XRT to breast x 20 fractions, completed on 01/15/2021 -- Tamoxifen  10/2020 - March 2023  CURRENT THERAPY: Anastrozole  + Lupron   BRIEF ONCOLOGIC HISTORY:   Oncology History  Malignant neoplasm of upper-outer quadrant of right female breast (HCC)  04/16/2020 Initial Diagnosis   Malignant neoplasm of upper-outer quadrant of right female breast [facility])    Genetic Testing   Negative genetic testing. No pathogenic variants identified on the [facility] Multi-Cancer Panel+RNA. The report date is 05/10/2020.  The Multi-Cancer Panel + RNA offered by [facility] includes sequencing and/or deletion duplication testing of the following 84 genes: AIP, ALK, APC, ATM, AXIN2,BAP1,  BARD1, BLM, BMPR1A, BRCA1, BRCA2, BRIP1, CASR, CDC73, CDH1, CDK4, CDKN1B, CDKN1C, CDKN2A (p14ARF), CDKN2A (p16INK4a), CEBPA, CHEK2, CTNNA1, DICER1, DIS3L2, EGFR (c.2369C>T, p.Thr790Met variant only), EPCAM (Deletion/duplication testing only), FH, FLCN, GATA2, GPC3, GREM1 (Promoter region deletion/duplication testing only), HOXB13 (c.251G>A, p.Gly84Glu), HRAS, KIT, MAX, MEN1, MET, MITF (c.952G>A, p.Glu318Lys variant only), MLH1, MSH2, MSH3, MSH6, MUTYH, NBN, NF1, NF2, NTHL1, PALB2, PDGFRA, PHOX2B, PMS2, POLD1, POLE, POT1, PRKAR1A, PTCH1, PTEN, RAD50, RAD51C, RAD51D, RB1, RECQL4, RET, RUNX1, SDHAF2, SDHA (sequence changes only), SDHB, SDHC, SDHD, SMAD4, SMARCA4, SMARCB1, SMARCE1, STK11, SUFU, TERC, TERT, TMEM127, TP53, TSC1, TSC2, VHL, WRN and WT1.   06/23/2020 - 05/26/2021 Chemotherapy   Patient is on Treatment Plan : BREAST Docetaxel  + Carboplatin  + Trastuzumab   (TCH) q21d / Trastuzumab  q21d     10/03/2022 -  Chemotherapy   Patient is on Treatment Plan : BREAST (RESEARCH) [id number] BUPROPION /PLACEBO       CANCER STAGING:  Cancer Staging  Malignant neoplasm of upper-outer quadrant of right female breast (HCC) Staging form: Breast, AJCC 8th Edition - Clinical stage from 04/21/2020: Stage IA (cT1c, cN0, cM0, G3, ER+, PR-, HER2+) - Unsigned   INTERVAL HISTORY:   [name], a 43 y.o. female, returns for routine follow-up of her stage Ia right-sided breast cancer.  Lauren Ortega was last seen on 06/05/2023 by Dr. Rogers.   At today's visit, she  reports feeling well, apart from some back and leg pain related to spondylolistheses.   She denies any recent hospitalizations, surgeries, or changes in her  baseline health status. She reports 100% energy and 100% appetite.   She is maintaining stable weight at this time, but is working hard to lose weight with working out and meeting with a nutritionist.  She denies any new onset breast lumps or axillary lymphadenopathy. She has some pulling pain and right-sided breast lymphedema, especially after working out. She denies any new aches/pains, headaches, or abdominal pain.  She continues to take Arimidex  daily and Lupron  every 3 months, but is having significant hot flashes and insomnia.  PCP prescribed trazodone, which is helping with insomnia, but not hot flashes.  She uses vaginal estradiol  cream for sexual side effects. She is taking calcium and vitamin D   Last menstrual period was June 2023.  ASSESSMENT & PLAN:  1.  Stage I (T1 cN0 M0) right breast infiltrating ductal carcinoma, HER-2 positive: - She reported feeling a lump in her right breast in February 2022 - Mammogram/ultrasound on 03/18/2020 done in [place] which showed  a 1.4 x 0.9 x 1.5 cm mass in the upper outer quadrant of the right breast. - Biopsy on 03/31/2020 consistent with intermediate to high-grade infiltrating ductal carcinoma of the  right breast at 10:30 position, HER-2 3+ positive, Ki-67 40%, ER weak staining in < 10% of tumor cells, PR negative. - MRI of the breast on 04/20/2020 with 1.3 x 1.5 x 1.4 cm irregular enhancing mass within the upper outer right breast.  No abnormal appearing lymph nodes. - Right breast lumpectomy and SLNB on 05/20/2020- grade 3 IDC, 1.8 cm, invasive carcinoma is less than 1 mm from anterior margin focally and less than 1 mm from the posterior margin broadly.  Margins negative for in situ carcinoma.  Lymphovascular invasion present.  0/5 lymph nodes positive.  There is focal ductal carcinoma within the vascular space in the soft tissue adjacent to the lymph node.  No carcinoma is seen within the lymph nodes.  PT1CPN0. - Genetic testing was negative. - 6 cycles of TCH from 06/23/2020 through 10/07/2020. - XRT to the breast, 20 fractions completed on 01/15/2021 with Dr. ELINORE in [place]. - Tamoxifen  started in October 2022, but was discontinued secondary to leg pains. - Estradiol  (<5.0) and FSH (61.9) levels were in the postmenopausal range when checked in February 2023.  She did not have any menses since the start of chemotherapy in June 2022, but restarted menses on 06/28/2021.  She has not had any menses after being started on Lupron . - Anastrozole  started on 03/24/2021. - Lupron  injections started 07/12/2021.  Receiving this every 3 months, most recently done on 11/29/2023.    - She is tolerating anastrozole  and Lupron .  She is having significant side effects (see below) - Most recent mammogram (04/07/2023): BI-RADS Category 1 - Physical exam (12/05/2023): Lumpectomy scar in the right breast around the areola within normal limits with no palpable masses or adenopathy.  Right breast radiation fibrosis and mild lymphedema. - Most recent labs (11/29/2023): Normal CBC and CMP.  CA 27.29 and CA 15-3 are normal. - No red flag symptoms per history/ROS today - PLAN: We will continue with premenopausal protocol for  treatment of HER2+/ER+ breast cancer, since she was premenopausal prior to chemotherapy, and has likely had menopause induced by chemotherapy and Lupron .  For now, continue anastrozole  daily + Lupron  every 3 months.  If side effects become bothersome, patient can switch back to tamoxifen  if she feels that she could tolerate it better. - Goal of treatment is at least 5 years.  We will send for BCI testing a year for (October 2026) - Next mammogram due 04/07/2024 - RTC in 6 months for labs and physical exam.  2.  Anastrozole  side effects - Patient reports severe hot flashes.  She has previously been on gabapentin  and duloxetine  for nerve pain, and did not like how they made her feel.  She is not interested in prescription treatment of hot flashes at this time. - Patient reports having issues with severe elevation in cholesterol, currently being managed by her PCP. - Reports insomnia.  PCP prescribed trazodone, which is helping with insomnia, but not hot flashes. - She uses vaginal estradiol  cream for sexual side effects. - PLAN: Recommendations as follows: Switch time of day of anastrozole .  Try black cohosh OTC. Referral to cholesterol clinic Per Dr. Davonna, okay to continue using vaginal estradiol  cream - If anastrozole  side effects are intolerable, she can switch back to tamoxifen  if she chooses  3.  Osteopenia, secondary to cancer therapy -  DEXA scan (06/17/2021): Osteopenia with T-score -1.1 - Most recent DEXA/bone density (11/29/2023): T-score -1.8 (right femoral neck), FRAX (not taking into account hormonal therapy) with 3.5% risk of major osteoporotic fracture in the next 10 years, and 0.4% risk of hip fracture in the next 10 years. - She takes daily calcium and vitamin D  - Most recent labs (11/29/2023): Calcium 9.5, vitamin D  43.98 - PLAN: Continue calcium and vitamin D  supplements.  Optimize bone health with weightbearing exercises. - Due to rapidly worsening bone density and pre-existing  degenerative disc disease, we will start patient on Prolia every 6 months.  Discussed risk of adverse reaction and side effects, and patient is agreeable to proceed.  Will plan on continuing Prolia for at least 5 years or for duration of hormonal treatment, followed by 1 year of alendronate to prevent rebound bone loss. - She does not have any dental issues.  Discussed importance of dental follow-up while on Prolia. - Next DEXA scan due November 2027 ## ADDENDUM (12/25/2023): Prolia injections denied by insurance at this time.  Peer to peer conversation with Dr. Milana on 12/25/2023.  Continues to deny Prolia at this time, but acknowledges that patient has had fairly rapid decline in her bone density.  He recommends repeat bone density/DEXA in 1 year (November 2026).  Would consider Prolia if T-score in the osteoporotic range, or if FRAX shows 20% risk of major osteoporotic fracture or >3% risk of hip fracture.  (Since FRAX does not take into account aromatase inhibitor use, could make up for this by clicking yes on glucocorticoid use.)  4.  Left upper extremity DVT: - Doppler on 07/06/2020 positive for acute DVT in the left axillary, peripheral subclavian vein and superficial thrombosis involving left basilic vein. - This was port induced and she was started on Eliquis , which was discontinued after Port-A-Cath removal in May 2023  5.  Peripheral neuropathy: - She had neuropathy in the feet.  She had laminectomy at L2-3 and L4-5.  Neuropathy has gotten better since then.  She is not on any medications.   6.  Hepatic lesion: - MRI of the liver on 05/25/2023 reviewed by me shows slightly diminished size of arterially hyperenhancing lesion in the posterior inferior right lobe of the liver, measuring 2.4 x 1.8 cm, previously 2.6 x 2.0 cm, suggestive of typical benign hepatic adenoma.  No evidence of metastatic disease. - No need to rescan her liver as the lesion has been stable over the last 2 years.  7.   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.  Never smoker. - Maternal grandmother had colon cancer.  Maternal grandfather died of non-Hodgkin's lymphoma.  Paternal grandfather died of lung cancer.  Maternal uncle had multiple myeloma.  Mother had basal cell skin cancer.  PLAN SUMMARY: >> Schedule for first dose of Prolia, continue CMP/Prolia every 6 months >> Lupron  every 3 months >> Referral entered for cholesterol clinic >> Mammogram due 04/07/2024 >> Labs in 6 months = CBC/D, CMP, CA 27.29, CA 15-3, vitamin D  >> OFFICE visit in 6 months (1 week after labs)    REVIEW OF SYSTEMS:   Review of Systems  Constitutional:  Negative for appetite change, chills, diaphoresis, fatigue, fever and unexpected weight change.  HENT:   Negative for lump/mass and nosebleeds.   Eyes:  Negative for eye problems.  Respiratory:  Negative for cough, hemoptysis and shortness of breath.   Cardiovascular:  Positive for palpitations. Negative for chest  pain and leg swelling.  Gastrointestinal:  Negative for abdominal pain, blood in stool, constipation, diarrhea, nausea and vomiting.  Genitourinary:  Negative for hematuria.   Musculoskeletal:  Positive for arthralgias and back pain.  Skin: Negative.   Neurological:  Positive for numbness. Negative for dizziness, headaches and light-headedness.  Hematological:  Does not bruise/bleed easily.    PHYSICAL EXAM:   Performance status (ECOG): 1 - Symptomatic but completely ambulatory  Vitals:   12/05/23 0810  BP: 139/87  Pulse: 88  Resp: 18  Temp: 98.3 F (36.8 C)  SpO2: 97%   Wt Readings from Last 3 Encounters:  12/05/23 151 lb (68.5 kg)  07/03/23 152 lb 4 oz (69.1 kg)  06/05/23 153 lb (69.4 kg)   Physical Exam Constitutional:      Appearance: Normal appearance. She is obese.  Cardiovascular:     Heart sounds: Normal heart sounds.  Pulmonary:     Breath sounds: Normal breath sounds.  Chest:      Comments: (12/05/2023): Lumpectomy scar in the right breast around the areola within normal limits with no palpable masses or adenopathy.  Right breast radiation fibrosis and mild lymphedema. Neurological:     General: No focal deficit present.     Mental Status: Mental status is at baseline.  Psychiatric:        Behavior: Behavior normal. Behavior is cooperative.      PAST MEDICAL/SURGICAL HISTORY:  Past Medical History:  Diagnosis Date   Anemia    Anxiety    Breast cancer (HCC) 03/2020   IDC grade 3 neg margins - Right   Depression    DVT (deep venous thrombosis) (HCC) 07/06/2020   left axillary/subclavian DVT in setting of left [place] Port-a-cath, discontinued 06/19/21   Family history of colon cancer    Family history of lymphoma    Family history of pancreatic cancer    Family history of skin cancer    Heart murmur    Hypertension    Left bundle branch block (LBBB) 05/19/2020   Lumbar degenerative disc disease    Neuropathy due to chemotherapeutic drug    Personal history of chemotherapy 2022   Personal history of radiation therapy 2022   Past Surgical History:  Procedure Laterality Date   BREAST LUMPECTOMY Right 05/20/2020   BREAST LUMPECTOMY WITH RADIOACTIVE SEED AND SENTINEL LYMPH NODE BIOPSY Right 05/20/2020   Procedure: RIGHT BREAST LUMPECTOMY WITH RADIOACTIVE SEED AND SENTINEL LYMPH NODE BIOPSY;  Surgeon: Vernetta Berg, MD;  Location: [facility];  Service: General;  Laterality: Right;   CESAREAN SECTION     x3   LUMBAR LAMINECTOMY/DECOMPRESSION MICRODISCECTOMY Left 01/13/2023   Procedure: Left - Lumbar Three-Lumbar Four - Lumbar Four-Lumbar Five with sublaminar decompression;  Surgeon: Joshua Alm Hamilton, MD;  Location[facility] OR;  Service: Neurosurgery;  Laterality: Left;  3C   PORT-A-CATH REMOVAL Left 06/19/2021   Procedure: REMOVAL PORT-A-CATH;  Surgeon: Paola Dreama SAILOR, MD;  Location: [facility] OR;  Service: General;  Laterality: Left;   PORTACATH  PLACEMENT Left 05/20/2020   Procedure: INSERTION PORT-A-CATH;  Surgeon: Vernetta Berg, MD;  Location: [facility];  Service: General;  Laterality: Left;   TONSILLECTOMY     TUBAL LIGATION      SOCIAL HISTORY:  Social History   Socioeconomic History   Marital status: Divorced    Spouse name: Not on file   Number of children: Not on file   Years of education: Not on file   Highest education level: Not on  file  Occupational History   Not on file  Tobacco Use   Smoking status: Never   Smokeless tobacco: Never  Vaping Use   Vaping status: Never Used  Substance and Sexual Activity   Alcohol use: Yes    Comment: occassionally   Drug use: Never   Sexual activity: Yes    Birth control/protection: Surgical  Other Topics Concern   Not on file  Social History Narrative   Not on file   Social Drivers of Health   Financial Resource Strain: Low Risk  (04/21/2020)   Overall Financial Resource Strain (CARDIA)    Difficulty of Paying Living Expenses: Not hard at all  Food Insecurity: No Food Insecurity (04/21/2020)   Hunger Vital Sign    Worried About Running Out of Food in the Last Year: Never true    Ran Out of Food in the Last Year: Never true  Transportation Needs: No Transportation Needs (04/21/2020)   PRAPARE - Administrator, Civil Service (Medical): No    Lack of Transportation (Non-Medical): No  Physical Activity: Insufficiently Active (04/21/2020)   Exercise Vital Sign    Days of Exercise per Week: 2 days    Minutes of Exercise per Session: 20 min  Stress: No Stress Concern Present (04/21/2020)   [employer] of Occupational Health - Occupational Stress Questionnaire    Feeling of Stress : Not at all  Social Connections: Moderately Integrated (04/21/2020)   Social Connection and Isolation Panel    Frequency of Communication with Friends and Family: More than three times a week    Frequency of Social Gatherings with Friends and Family: More  than three times a week    Attends Religious Services: 1 to 4 times per year    Active Member of [employer] or Organizations: No    Attends Engineer, Structural: 1 to 4 times per year    Marital Status: Divorced  Catering Manager Violence: Not At Risk (04/21/2020)   Humiliation, Afraid, Rape, and Kick questionnaire    Fear of Current or Ex-Partner: No    Emotionally Abused: No    Physically Abused: No    Sexually Abused: No    FAMILY HISTORY:  Family History  Problem Relation Age of Onset   Basal cell carcinoma Mother 46   Bladder Cancer Maternal Uncle 72   Lymphoma Maternal Uncle 53       Waldenstroms    Pancreatic cancer Maternal Grandmother    Non-Hodgkin's lymphoma Maternal Grandfather    Lung cancer Paternal Grandfather    Colon cancer Other        x8 or 9   Colon cancer Maternal Great-grandfather     CURRENT MEDICATIONS:  Current Outpatient Medications  Medication Sig Dispense Refill   acetaminophen  (TYLENOL ) 500 MG tablet Take 2 tablets (1,000 mg total) by mouth 4 (four) times daily. 120 tablet 3   anastrozole  (ARIMIDEX ) 1 MG tablet Take 1 tablet (1 mg total) by mouth daily. 90 tablet 1   Cholecalciferol (VITAMIN D3 PO) Take 1 tablet by mouth daily.     cyanocobalamin (VITAMIN B12) 1000 MCG tablet TAKE 1 TABLET BY MOUTH EVERY DAY 90 tablet 1   cyclobenzaprine (FLEXERIL) 10 MG tablet Take 10 mg by mouth every 8 (eight) hours as needed.     estradiol  (ESTRACE ) 0.1 MG/GM vaginal cream PLACE 1 APPLICATORFUL VAGINALLY 2 (TWO) TIMES DAILY. TWICE DAILY FOR TWO WEEKS, THEN TWICE WEEKLY. 170 g 12   ferrous sulfate 325 (65 FE) MG  EC tablet Take 325 mg by mouth daily.     folic acid (FOLVITE) 1 MG tablet Take 1 mg by mouth daily.     HYDROcodone-acetaminophen  (NORCO/VICODIN) 5-325 MG tablet Take 1 tablet by mouth every 6 (six) hours as needed.     ibuprofen  (ADVIL ) 200 MG tablet Take 800 mg by mouth every 6 (six) hours as needed for moderate pain (pain score 4-6).      leuprolide  (LUPRON ) 11.25 MG injection Inject 11.25 mg into the muscle every 3 (three) months.     magnesium  oxide (MAG-OX) 400 (240 Mg) MG tablet TAKE 1 TABLET BY MOUTH TWICE A DAY IN THE MORNING AND IN THE EVENING 180 tablet 2   rosuvastatin (CRESTOR) 10 MG tablet Take 1 tablet by mouth daily.     traZODone (DESYREL) 150 MG tablet Take by mouth.     No current facility-administered medications for this visit.    ALLERGIES:  No Active Allergies   LABORATORY DATA:  I have reviewed the labs as listed.     Latest Ref Rng & Units 11/29/2023    9:31 AM 05/25/2023   12:58 PM 01/09/2023    2:14 PM  CBC  WBC 4.0 - 10.5 K/uL 6.6  8.0  8.0   Hemoglobin 12.0 - 15.0 g/dL 85.8  86.7  85.5   Hematocrit 36.0 - 46.0 % 41.6  38.9  43.2   Platelets 150 - 400 K/uL 311  358  412       Latest Ref Rng & Units 11/29/2023    9:31 AM 05/25/2023   12:58 PM 01/09/2023    2:14 PM  CMP  Glucose 70 - 99 mg/dL 871  96  89   BUN 6 - 20 mg/dL 13  15  10    Creatinine 0.44 - 1.00 mg/dL 9.22  9.36  9.18   Sodium 135 - 145 mmol/L 140  138  139   Potassium 3.5 - 5.1 mmol/L 4.1  3.9  3.3   Chloride 98 - 111 mmol/L 102  102  103   CO2 22 - 32 mmol/L 28  24  27    Calcium 8.9 - 10.3 mg/dL 9.5  89.8  9.6   Total Protein 6.5 - 8.1 g/dL 7.4  7.3    Total Bilirubin 0.0 - 1.2 mg/dL 0.4  0.7    Alkaline Phos 38 - 126 U/L 105  94    AST 15 - 41 U/L 28  24    ALT 0 - 44 U/L 29  26      DIAGNOSTIC IMAGING:  I have independently reviewed the scans and discussed with the patient. DG Bone Density Result Date: 11/29/2023 EXAM: DUAL X-RAY ABSORPTIOMETRY (DXA) FOR BONE MINERAL DENSITY 11/29/2023 11:18 am CLINICAL DATA:  43 year old Female Postmenopausal. Anti-estrogen therapy TECHNIQUE: An axial (e.g., hips, spine) and/or appendicular (e.g., radius) exam was performed, as appropriate, using [employer] [employer] [facility]. Images are obtained for bone mineral density measurement and are not obtained for  diagnostic purposes. [id number] Exclusions: Lumbar spine due to advanced degenerative change. COMPARISON:  06/17/2021. FINDINGS: Scan quality: Good. LEFT FEMORAL NECK: BMD (in g/cm2): 0.807 T-score: -1.7 Z-score: -1.1 LEFT TOTAL HIP: BMD (in g/cm2): 0.791 T-score: -1.7 Z-score: -1.4 RIGHT FEMORAL NECK: BMD (in g/cm2): 0.791 T-score: -1.8 Z-score: -1.2 RIGHT TOTAL HIP: BMD (in g/cm2): 0.861 T-score: -1.2 Z-score: -0.8 DUAL-FEMUR TOTAL MEAN: Rate of change from previous exam: -7.5 % LEFT FOREARM (RADIUS 33%): BMD (in g/cm2): 0.591 T-score: -  1.6 Z-score: -1.6 Rate of change from previous exam: -7.4 % FRAX 10-YEAR PROBABILITY OF FRACTURE: 10-year fracture risk is performed using the [facility] FRAX calculator based on patient-reported risk factors. Major osteoporotic fracture: 3.5% Hip fracture: 0.4% Other situations known to alter the reliability of the FRAX score should be considered when making treatment decisions, including chronic glucocorticoid use and past treatments. Further guidance on treatment can be found at the [facility] Osteoporosis Foundation's website https://www.patton.com/. IMPRESSION: Osteopenia based on BMD. Fracture risk is increased. Increased risk is based on low BMD. RECOMMENDATIONS: 1. All patients should optimize calcium and vitamin D  intake. 2. Consider FDA-approved medical therapies in postmenopausal women and men aged 30 years and older, based on the following: - A hip or vertebral (clinical or morphometric) fracture - T-score less than or equal to -2.5 and secondary causes have been excluded. - Low bone mass (T-score between -1.0 and -2.5) and a 10-year probability of a hip fracture greater than or equal to 3% or a 10-year probability of a major osteoporosis-related fracture greater than or equal to 20% based on the [place] -adapted WHO algorithm. - Clinician judgment and/or patient preferences may indicate treatment for people with 10-year fracture probabilities above or below these levels 3.  Patients with diagnosis of osteoporosis or at high risk for fracture should have regular bone mineral density tests. For patients eligible for Medicare, routine testing is allowed once every 2 years. The testing frequency can be increased to one year for patients who have rapidly progressing disease, those who are receiving or discontinuing medical therapy to restore bone mass, or have additional risk factors. Electronically Signed   By: Dina  Arceo M.D.   On: 11/29/2023 13:24     WRAP UP:  All questions were answered. The patient knows to call the clinic with any problems, questions or concerns.  Medical decision making: Moderate  Time spent on visit: I spent 20 minutes counseling the patient face to face. The total time spent in the appointment was 30 minutes and more than 50% was on counseling.  Pleasant CHRISTELLA Barefoot, PA-C  12/05/23 9:26 AM

## 2023-12-05 ENCOUNTER — Inpatient Hospital Stay: Payer: PRIVATE HEALTH INSURANCE | Admitting: Physician Assistant

## 2023-12-05 VITALS — BP 139/87 | HR 88 | Temp 98.3°F | Resp 18 | Ht 60.0 in | Wt 151.0 lb

## 2023-12-05 DIAGNOSIS — E785 Hyperlipidemia, unspecified: Secondary | ICD-10-CM

## 2023-12-05 DIAGNOSIS — Z79899 Other long term (current) drug therapy: Secondary | ICD-10-CM

## 2023-12-05 DIAGNOSIS — M8580 Other specified disorders of bone density and structure, unspecified site: Secondary | ICD-10-CM

## 2023-12-05 DIAGNOSIS — Z170 Estrogen receptor positive status [ER+]: Secondary | ICD-10-CM

## 2023-12-05 DIAGNOSIS — C50411 Malignant neoplasm of upper-outer quadrant of right female breast: Secondary | ICD-10-CM | POA: Diagnosis not present

## 2023-12-05 NOTE — Patient Instructions (Addendum)
 [facility] at [facility] **VISIT SUMMARY & IMPORTANT INSTRUCTIONS **   You were seen today by Pleasant Barefoot PA-C for your history of breast cancer and other concerns.    HISTORY OF BREAST CANCER Your most recent labs, physical exam, and mammogram (March 2025) did not show any evidence of recurrent breast cancer. Continue to take anastrozole  (Arimidex ) daily and Lupron  injections every 3 months Your next screening mammogram is due in March 2026. We we will see you for follow-up labs and physical exam in 6 months.   ANASTROZOLE  SIDE EFFECTS HOT FLASHES: Try taking over-the-counter black cohosh.  Switch the timing of your anastrozole  to take in the evening instead of the morning. HIGH CHOLESTEROL: We will refer you to the cholesterol clinic, for specialized management of your levels. SEXUAL SIDE EFFECTS: Continue vaginal estradiol  cream **If side effects from anastrozole /Lupron  become too bothersome, you can switch back to tamoxifen , if you feel that you tolerated it better.**  OSTEOPENIA Your osteopenia (weak and fragile bones) is related to your anastrozole  (Arimidex ) pill and your Lupron  injections, which you are taking for treatment of your breast cancer. In order to prevent any worsening of your bone density or progression to osteoporosis, I recommend treatment with Prolia (denosumab) injections, which are given every 6 months here at the [facility].  Please see the attached handout for additional information about Prolia, including potential side effects and the risk of rare but serious events such as osteonecrosis of the jaw or atypical femur fracture. Make sure that you tell your dentist that you are on Prolia and see your dentist every 6 months while taking Prolia. Continue to optimize your bone health with calcium, vitamin D , and weightbearing exercises. Your next bone density scan will be done in November 2027.  Q & A Even with weakly staining ER  receptors, Dr. Davonna recommends at least 5 years of treatment with anastrozole /Lupron  or with tamoxifen .  We will check BCI testing at the 4-year mark (October 2026) to see whether or not you would benefit from 10 years of hormonal treatment. OncotypeDX score was not completed due to HER2 positive status.  Oncotype only applies to patients who are HER2 negative.  FOLLOW-UP APPOINTMENT: 6 months  ** Thank you for trusting me with your healthcare!  I strive to provide all of my patients with quality care at each visit.  If you receive a survey for this visit, I would be so grateful to you for taking the time to provide feedback.  Thank you in advance!  ~ Mikeisha Lemonds                                        Dr. Mickiel Davonna Pleasant Barefoot, PA-C          Delon Hope, NP   - - - - - - - - - - - - - - - - - -    Thank you for choosing [facility] at [facility] to provide your oncology and hematology care.  To afford each patient quality time with our provider, please arrive at least 15 minutes before your scheduled appointment time.   If you have a lab appointment with the Cancer Center please come in thru the Main Entrance and check in at the main information desk.  You need to re-schedule your appointment  should you arrive 10 or more minutes late.  We strive to give you quality time with our providers, and arriving late affects you and other patients whose appointments are after yours.  Also, if you no show three or more times for appointments you may be dismissed from the clinic at the providers discretion.     Again, thank you for choosing [facility].  Our hope is that these requests will decrease the amount of time that you wait before being seen by our physicians.       _____________________________________________________________  Should you have questions after your visit to [facility], please contact our office at [phone]  and follow the prompts.  Our office hours are 8:00 a.m. and 4:30 p.m. Monday - Friday.  Please note that voicemails left after 4:00 p.m. may not be returned until the following business day.  We are closed weekends and major holidays.  You do have access to a nurse 24-7, just call the main number to the clinic [phone] and do not press any options, hold on the line and a nurse will answer the phone.    For prescription refill requests, have your pharmacy contact our office and allow 72 hours.

## 2023-12-06 ENCOUNTER — Other Ambulatory Visit: Payer: Self-pay

## 2023-12-08 ENCOUNTER — Encounter: Payer: Self-pay | Admitting: Physician Assistant

## 2023-12-11 ENCOUNTER — Telehealth: Payer: Self-pay | Admitting: Oncology

## 2023-12-12 ENCOUNTER — Inpatient Hospital Stay: Payer: PRIVATE HEALTH INSURANCE

## 2023-12-25 ENCOUNTER — Encounter: Payer: Self-pay | Admitting: Physician Assistant

## 2024-01-01 ENCOUNTER — Ambulatory Visit: Payer: PRIVATE HEALTH INSURANCE

## 2024-01-03 ENCOUNTER — Encounter: Payer: Self-pay | Admitting: Physician Assistant

## 2024-01-11 ENCOUNTER — Encounter (HOSPITAL_BASED_OUTPATIENT_CLINIC_OR_DEPARTMENT_OTHER): Payer: Self-pay | Admitting: Internal Medicine

## 2024-01-11 ENCOUNTER — Ambulatory Visit (HOSPITAL_BASED_OUTPATIENT_CLINIC_OR_DEPARTMENT_OTHER): Payer: PRIVATE HEALTH INSURANCE | Admitting: Internal Medicine

## 2024-01-11 VITALS — BP 142/84 | HR 84 | Ht 60.0 in | Wt 153.1 lb

## 2024-01-11 DIAGNOSIS — E782 Mixed hyperlipidemia: Secondary | ICD-10-CM

## 2024-01-11 DIAGNOSIS — E785 Hyperlipidemia, unspecified: Secondary | ICD-10-CM

## 2024-01-11 DIAGNOSIS — R030 Elevated blood-pressure reading, without diagnosis of hypertension: Secondary | ICD-10-CM

## 2024-01-11 MED ORDER — ROSUVASTATIN CALCIUM 20 MG PO TABS: 20.0000 mg | ORAL_TABLET | Freq: Every day | ORAL | 3 refills | Status: AC

## 2024-01-11 NOTE — Patient Instructions (Signed)
 Medication Instructions:   INCREASE rosuvastatin  20mg  daily   *If you need a refill on your cardiac medications before your next appointment, please call your pharmacy*  Lab Work:  FASTING lab work in 3 months  If you have labs (blood work) drawn today and your tests are completely normal, you will receive your results only by: [employer] (if you have MyChart) OR A paper copy in the mail If you have any lab test that is abnormal or we need to change your treatment, we will call you to review the results.  Testing/Procedures: NONE  Follow-Up: At [facility], you and your health needs are our priority.  As part of our continuing mission to provide you with exceptional heart care, our providers are all part of one team.  This team includes your primary Cardiologist (physician) and Advanced Practice Providers or APPs (Physician Assistants and Nurse Practitioners) who all work together to provide you with the care you need, when you need it.  Your next appointment:    3 months with Dr. Mona or Rosaline Bane NP  We recommend signing up for the patient portal called MyChart.  Sign up information is provided on this After Visit Summary.  MyChart is used to connect with patients for Virtual Visits (Telemedicine).  Patients are able to view lab/test results, encounter notes, upcoming appointments, etc.  Non-urgent messages can be sent to your provider as well.   To learn more about what you can do with MyChart, go to [email or web address].   Other Instructions   Please check BP at home 1-2 times daily. Send readings via MyChart after 7-10 days.  HOW TO TAKE YOUR BLOOD PRESSURE: Rest 5 minutes before taking your blood pressure. Dont smoke or drink caffeinated beverages for at least 30 minutes before. Take your blood pressure before (not after) you eat. Sit comfortably with your back supported and both feet on the floor (dont cross your legs). Elevate your arm to  heart level on a table or a desk. Use the proper sized cuff. It should fit smoothly and snugly around your bare upper arm. There should be enough room to slip a fingertip under the cuff. The bottom edge of the cuff should be 1 inch above the crease of the elbow. Ideally, take 3 measurements at one sitting and record the average.

## 2024-01-12 ENCOUNTER — Encounter (HOSPITAL_BASED_OUTPATIENT_CLINIC_OR_DEPARTMENT_OTHER): Payer: Self-pay

## 2024-01-12 NOTE — Progress Notes (Signed)
 "   LIPID CLINIC CONSULT NOTE  Chief Complaint:  Manage dyslipidemia  Primary Care Physician: Waddell Mitzie SAUNDERS, FNP  Primary Cardiologist:  None  HPI:  Lauren Ortega is a 43 y.o. female who is being seen today for the evaluation of dyslipidemia at the request of Pennington, Rebekah M, PA-C.  This a pleasant 43 year old female kindly referred for evaluation management of dyslipidemia.  She has a history of high triglycerides in the past.  Recent lipid showed total cholesterol 200, triglycerides 243, HDL 57 and LDL 102.  She has been on rosuvastatin  10 mg daily.  She has a history of breast cancer and also has been on anastrozole .  She was referred by her oncologist for evaluation and management of high triglycerides.  She denies any excess alcohol use.  Diet is generally healthy and she tries to avoid sugars and high saturated fats.  She states she has also noted some elevated blood pressure readings however it is variable.  She does not regularly check her blood pressure at home.  PMHx:  Past Medical History:  Diagnosis Date   Anemia    Anxiety    Breast cancer (HCC) 03/2020   IDC grade 3 neg margins - Right   Depression    DVT (deep venous thrombosis) (HCC) 07/06/2020   left axillary/subclavian DVT in setting of left [place] Port-a-cath, discontinued 06/19/21   Family history of colon cancer    Family history of lymphoma    Family history of pancreatic cancer    Family history of skin cancer    Heart murmur    Hypertension    Left bundle branch block (LBBB) 05/19/2020   Lumbar degenerative disc disease    Neuropathy due to chemotherapeutic drug    Personal history of chemotherapy 2022   Personal history of radiation therapy 2022    Past Surgical History:  Procedure Laterality Date   BREAST LUMPECTOMY Right 05/20/2020   BREAST LUMPECTOMY WITH RADIOACTIVE SEED AND SENTINEL LYMPH NODE BIOPSY Right 05/20/2020   Procedure: RIGHT BREAST LUMPECTOMY WITH RADIOACTIVE SEED AND SENTINEL  LYMPH NODE BIOPSY;  Surgeon: Vernetta Berg, MD;  Location: [facility];  Service: General;  Laterality: Right;   CESAREAN SECTION     x3   LUMBAR LAMINECTOMY/DECOMPRESSION MICRODISCECTOMY Left 01/13/2023   Procedure: Left - Lumbar Three-Lumbar Four - Lumbar Four-Lumbar Five with sublaminar decompression;  Surgeon: Joshua Alm Hamilton, MD;  [facility];  Service: Neurosurgery;  Laterality: Left;  3C   PORT-A-CATH REMOVAL Left 06/19/2021   Procedure: REMOVAL PORT-A-CATH;  Surgeon: Paola Dreama SAILOR, MD;  Location: [facility] OR;  Service: General;  Laterality: Left;   PORTACATH PLACEMENT Left 05/20/2020   Procedure: INSERTION PORT-A-CATH;  Surgeon: Vernetta Berg, MD;  Location: [facility];  Service: General;  Laterality: Left;   TONSILLECTOMY     TUBAL LIGATION      FAMHx:  Family History  Problem Relation Age of Onset   Basal cell carcinoma Mother 38   Bladder Cancer Maternal Uncle 72   Lymphoma Maternal Uncle 86       Waldenstroms    Pancreatic cancer Maternal Grandmother    Non-Hodgkin's lymphoma Maternal Grandfather    Lung cancer Paternal Grandfather    Colon cancer Other        x8 or 9   Colon cancer Maternal Great-grandfather     SOCHx:   reports that she has never smoked. She has never used smokeless tobacco. She reports current alcohol use. She reports that  she does not use drugs.  ALLERGIES:  Allergies[1]  ROS: Pertinent items noted in HPI and remainder of comprehensive ROS otherwise negative.  HOME MEDS: Medications Ordered Prior to Encounter[2]  LABS/IMAGING: No results found for this or any previous visit (from the past 48 hours). No results found.  LIPID PANEL: No results found for: CHOL, TRIG, HDL, CHOLHDL, VLDL, LDLCALC, LDLDIRECT  No results found for: LIPOA   WEIGHTS: Wt Readings from Last 3 Encounters:  01/11/24 153 lb 1.6 oz (69.4 kg)  12/05/23 151 lb (68.5 kg)  07/03/23 152 lb 4 oz (69.1 kg)     VITALS: BP (!) 142/84   Pulse 84   Ht 5' (1.524 m)   Wt 153 lb 1.6 oz (69.4 kg)   LMP 05/17/2020   SpO2 97%   BMI 29.90 kg/m   EXAM: Deferred  EKG: Deferred  ASSESSMENT: Mixed dyslipidemia with high triglycerides History of breast cancer Elevated blood pressure without a diagnosis of hypertension  PLAN: 1.   Lauren Ortega has a mixed dyslipidemia with high triglycerides.  She is on a relatively low dose of rosuvastatin .  I would advise increasing from 10 to 20 mg which should help with both her triglycerides and her LDL cholesterol.  We have also provided some dietary information to lower saturated fats which can help with her triglycerides.  Will plan repeat fasting lab work in about 3 months.  Follow-up afterwards.  Additionally, her blood pressure was noted to be somewhat elevated today.  I have advised her to take home blood pressure readings and reach out to me with those readings as she may need some therapy for that as well.  Lauren KYM Maxcy, MD, [facility], FNLA, FACP  [place]  [facility]  Medical Director of the Advanced Lipid Disorders &  Cardiovascular Risk Reduction Clinic Diplomate of the American Board of Clinical Lipidology Attending Cardiologist  Direct Dial: [phone]  Fax: [phone]  Website:  [email or web address]  Lauren Ortega 01/12/2024, 2:29 PM     [1] No Active Allergies [2]  Current Outpatient Medications on File Prior to Visit  Medication Sig Dispense Refill   acetaminophen  (TYLENOL ) 500 MG tablet Take 2 tablets (1,000 mg total) by mouth 4 (four) times daily. 120 tablet 3   anastrozole  (ARIMIDEX ) 1 MG tablet Take 1 tablet (1 mg total) by mouth daily. 90 tablet 1   BLACK COHOSH PO Take 1 tablet by mouth daily.     Cholecalciferol (VITAMIN D3 PO) Take 1 tablet by mouth daily.     cyanocobalamin (VITAMIN B12) 1000 MCG tablet TAKE 1 TABLET BY MOUTH EVERY DAY 90 tablet 1   cyclobenzaprine (FLEXERIL) 10 MG tablet Take 10 mg by mouth  every 8 (eight) hours as needed.     estradiol  (ESTRACE ) 0.1 MG/GM vaginal cream PLACE 1 APPLICATORFUL VAGINALLY 2 (TWO) TIMES DAILY. TWICE DAILY FOR TWO WEEKS, THEN TWICE WEEKLY. 170 g 12   ferrous sulfate 325 (65 FE) MG EC tablet Take 325 mg by mouth daily.     folic acid (FOLVITE) 1 MG tablet Take 1 mg by mouth daily.     HYDROcodone-acetaminophen  (NORCO/VICODIN) 5-325 MG tablet Take 1 tablet by mouth every 6 (six) hours as needed.     ibuprofen  (ADVIL ) 200 MG tablet Take 800 mg by mouth every 6 (six) hours as needed for moderate pain (pain score 4-6).     leuprolide  (LUPRON ) 11.25 MG injection Inject 11.25 mg into the muscle every 3 (three) months.     magnesium   oxide (MAG-OX) 400 (240 Mg) MG tablet TAKE 1 TABLET BY MOUTH TWICE A DAY IN THE MORNING AND IN THE EVENING 180 tablet 2   traZODone (DESYREL) 150 MG tablet Take by mouth.     Turmeric (QC TUMERIC COMPLEX PO) Take 1 tablet by mouth daily.     No current facility-administered medications on file prior to visit.   "

## 2024-01-15 ENCOUNTER — Ambulatory Visit: Payer: PRIVATE HEALTH INSURANCE | Attending: Family Medicine

## 2024-01-15 VITALS — Wt 152.5 lb

## 2024-01-15 DIAGNOSIS — Z483 Aftercare following surgery for neoplasm: Secondary | ICD-10-CM | POA: Insufficient documentation

## 2024-01-15 NOTE — Therapy (Signed)
 " OUTPATIENT PHYSICAL THERAPY SOZO SCREENING NOTE   Patient Name: Lauren Ortega MRN: [id number] DOB:07-08-1980, 43 y.o., female Today's Date: 01/15/2024  PCP: Waddell Mitzie SAUNDERS, FNP REFERRING PROVIDER: Waddell Mitzie SAUNDERS, FNP   PT End of Session - 01/15/24 1655     Visit Number 7   # unchanged due to screen only   PT Start Time 1653    PT Stop Time 1657    PT Time Calculation (min) 4 min    Activity Tolerance Patient tolerated treatment well    Behavior During Therapy [facility] for tasks assessed/performed          Past Medical History:  Diagnosis Date   Anemia    Anxiety    Breast cancer (HCC) 03/2020   IDC grade 3 neg margins - Right   Depression    DVT (deep venous thrombosis) (HCC) 07/06/2020   left axillary/subclavian DVT in setting of left [place] Port-a-cath, discontinued 06/19/21   Family history of colon cancer    Family history of lymphoma    Family history of pancreatic cancer    Family history of skin cancer    Heart murmur    Hypertension    Left bundle branch block (LBBB) 05/19/2020   Lumbar degenerative disc disease    Neuropathy due to chemotherapeutic drug    Personal history of chemotherapy 2022   Personal history of radiation therapy 2022   Past Surgical History:  Procedure Laterality Date   BREAST LUMPECTOMY Right 05/20/2020   BREAST LUMPECTOMY WITH RADIOACTIVE SEED AND SENTINEL LYMPH NODE BIOPSY Right 05/20/2020   Procedure: RIGHT BREAST LUMPECTOMY WITH RADIOACTIVE SEED AND SENTINEL LYMPH NODE BIOPSY;  Surgeon: Vernetta Berg, MD;  Location: [facility];  Service: General;  Laterality: Right;   CESAREAN SECTION     x3   LUMBAR LAMINECTOMY/DECOMPRESSION MICRODISCECTOMY Left 01/13/2023   Procedure: Left - Lumbar Three-Lumbar Four - Lumbar Four-Lumbar Five with sublaminar decompression;  Surgeon: Joshua Alm Hamilton, MD;  Location: [facility] OR;  Service: Neurosurgery;  Laterality: Left;  3C   PORT-A-CATH REMOVAL Left 06/19/2021   Procedure: REMOVAL  PORT-A-CATH;  Surgeon: Paola Dreama SAILOR, MD;  Location: [facility] OR;  Service: General;  Laterality: Left;   PORTACATH PLACEMENT Left 05/20/2020   Procedure: INSERTION PORT-A-CATH;  Surgeon: Vernetta Berg, MD;  Location: [facility];  Service: General;  Laterality: Left;   TONSILLECTOMY     TUBAL LIGATION     Patient Active Problem List   Diagnosis Date Noted   S/P lumbar laminectomy 01/13/2023   Cellulitis 06/19/2021   HTN (hypertension) 06/19/2021   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) 06/19/2021   Anxiety and depression 06/19/2021   Iron deficiency anemia 05/04/2021   Port-A-Cath in place 06/16/2020   Genetic testing 05/12/2020   Family history of pancreatic cancer    Family history of skin cancer    Family history of lymphoma    Family history of colon cancer    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 Vernetta, HTN, DDD lumbar spine, Developed multiple bloodclots in LUE due to portacath which are now resolved.  Swelling from bloodclots has not resolved.   PRECAUTIONS: right UE Lymphedema risk, None  SUBJECTIVE: Pt returns for her 6 month L-Dex screen.  I haven't gotten the compression bras but I'm wearing my sports bras more and doing the self MLD which is helping the lateral breast swelling.  PAIN:  Are you having pain? No  SOZO SCREENING: Patient was assessed today using the SOZO machine to determine the lymphedema index score. This was compared to her baseline score. It was determined that she is within the recommended range when compared to her baseline and no further action is needed at this time. She will continue  SOZO screenings. These are done every 3 months for 2 years post operatively followed by every 6 months for 2 years, and then annually. .    L-DEX FLOWSHEETS - 01/15/24 1600       L-DEX LYMPHEDEMA SCREENING   Measurement Type Unilateral    L-DEX MEASUREMENT EXTREMITY Upper Extremity    POSITION  Standing    DOMINANT SIDE Right    At Risk Side Right    BASELINE SCORE (UNILATERAL) 0.1    L-DEX SCORE (UNILATERAL) -2.3    VALUE CHANGE (UNILAT) -2.4         P: Pt has one more 6 month L-Dex screen then can transition to annual.    Aden Berwyn Caldron, PTA 01/15/2024, 4:56 PM "

## 2024-01-22 ENCOUNTER — Encounter: Payer: Self-pay | Admitting: *Deleted

## 2024-03-06 ENCOUNTER — Inpatient Hospital Stay: Payer: PRIVATE HEALTH INSURANCE | Attending: Hematology

## 2024-04-12 ENCOUNTER — Ambulatory Visit (HOSPITAL_COMMUNITY): Payer: PRIVATE HEALTH INSURANCE

## 2024-04-17 ENCOUNTER — Encounter (HOSPITAL_BASED_OUTPATIENT_CLINIC_OR_DEPARTMENT_OTHER): Payer: PRIVATE HEALTH INSURANCE | Admitting: Nurse Practitioner

## 2024-06-03 ENCOUNTER — Inpatient Hospital Stay: Payer: PRIVATE HEALTH INSURANCE | Attending: Hematology

## 2024-06-03 ENCOUNTER — Inpatient Hospital Stay: Payer: PRIVATE HEALTH INSURANCE

## 2024-06-10 ENCOUNTER — Inpatient Hospital Stay: Payer: PRIVATE HEALTH INSURANCE | Admitting: Physician Assistant

## 2024-06-10 ENCOUNTER — Inpatient Hospital Stay: Payer: PRIVATE HEALTH INSURANCE

## 2024-07-15 ENCOUNTER — Ambulatory Visit: Payer: PRIVATE HEALTH INSURANCE

## 2024-09-03 ENCOUNTER — Inpatient Hospital Stay: Payer: PRIVATE HEALTH INSURANCE
